# Patient Record
Sex: Male | Born: 1940 | Race: White | Hispanic: No | Marital: Married | Smoking: Former smoker
Health system: Southern US, Community
[De-identification: ages and names within clinical notes are randomized; demographics above are authoritative.]

## PROBLEM LIST (undated history)

## (undated) DIAGNOSIS — J449 Chronic obstructive pulmonary disease, unspecified: Secondary | ICD-10-CM

## (undated) DIAGNOSIS — G4733 Obstructive sleep apnea (adult) (pediatric): Secondary | ICD-10-CM

## (undated) DIAGNOSIS — G4710 Hypersomnia, unspecified: Secondary | ICD-10-CM

## (undated) DIAGNOSIS — J45909 Unspecified asthma, uncomplicated: Secondary | ICD-10-CM

## (undated) DIAGNOSIS — R911 Solitary pulmonary nodule: Secondary | ICD-10-CM

## (undated) DIAGNOSIS — R0683 Snoring: Secondary | ICD-10-CM

## (undated) DIAGNOSIS — E7800 Pure hypercholesterolemia, unspecified: Secondary | ICD-10-CM

## (undated) DIAGNOSIS — I10 Essential (primary) hypertension: Secondary | ICD-10-CM

## (undated) DIAGNOSIS — J301 Allergic rhinitis due to pollen: Secondary | ICD-10-CM

## (undated) DIAGNOSIS — R0602 Shortness of breath: Secondary | ICD-10-CM

## (undated) DIAGNOSIS — I517 Cardiomegaly: Secondary | ICD-10-CM

## (undated) DIAGNOSIS — N400 Enlarged prostate without lower urinary tract symptoms: Secondary | ICD-10-CM

## (undated) HISTORY — DX: Pure hypercholesterolemia, unspecified: E78.00

## (undated) HISTORY — DX: Hypersomnia, unspecified: G47.10

## (undated) HISTORY — PX: HERNIA REPAIR: SHX51

## (undated) HISTORY — DX: Allergic rhinitis due to pollen: J30.1

## (undated) HISTORY — DX: Benign prostatic hyperplasia without lower urinary tract symptoms: N40.0

## (undated) HISTORY — DX: Shortness of breath: R06.02

## (undated) HISTORY — PX: CHOLECYSTECTOMY: SHX55

## (undated) HISTORY — DX: Snoring: R06.83

## (undated) HISTORY — DX: Obstructive sleep apnea (adult) (pediatric): G47.33

## (undated) HISTORY — DX: Chronic obstructive pulmonary disease, unspecified: J44.9

## (undated) HISTORY — DX: Unspecified asthma, uncomplicated: J45.909

## (undated) HISTORY — PX: ARTERY REPAIR: SHX559

## (undated) HISTORY — DX: Solitary pulmonary nodule: R91.1

## (undated) HISTORY — DX: Essential (primary) hypertension: I10

## (undated) HISTORY — PX: BACK SURGERY: SHX140

## (undated) HISTORY — DX: Cardiomegaly: I51.7

---

## 2001-07-07 ENCOUNTER — Ambulatory Visit (HOSPITAL_COMMUNITY): Admission: RE | Admit: 2001-07-07 | Discharge: 2001-07-07 | Payer: Self-pay | Admitting: Neurology

## 2001-07-07 ENCOUNTER — Encounter: Payer: Self-pay | Admitting: Neurology

## 2004-03-21 ENCOUNTER — Ambulatory Visit: Payer: Self-pay | Admitting: Surgery

## 2006-10-28 ENCOUNTER — Inpatient Hospital Stay: Payer: Self-pay | Admitting: Vascular Surgery

## 2006-10-28 ENCOUNTER — Other Ambulatory Visit: Payer: Self-pay

## 2007-03-16 ENCOUNTER — Ambulatory Visit: Payer: Self-pay | Admitting: Family Medicine

## 2007-03-23 ENCOUNTER — Ambulatory Visit: Payer: Self-pay | Admitting: Family Medicine

## 2007-05-11 ENCOUNTER — Ambulatory Visit: Payer: Self-pay

## 2007-07-26 ENCOUNTER — Ambulatory Visit: Payer: Self-pay | Admitting: Family Medicine

## 2008-07-12 IMAGING — CR DG CHEST 1V PORT
1 series · 1 of 1 positions shown · non-contrast
Comparison: none

REASON FOR EXAM: ? cva
COMMENTS:

PROCEDURE:     DXR - DXR PORTABLE CHEST SINGLE VIEW  - October 28, 2006 [DATE]
RESULT:     Comparison: No available comparison exam.

[view not recorded]
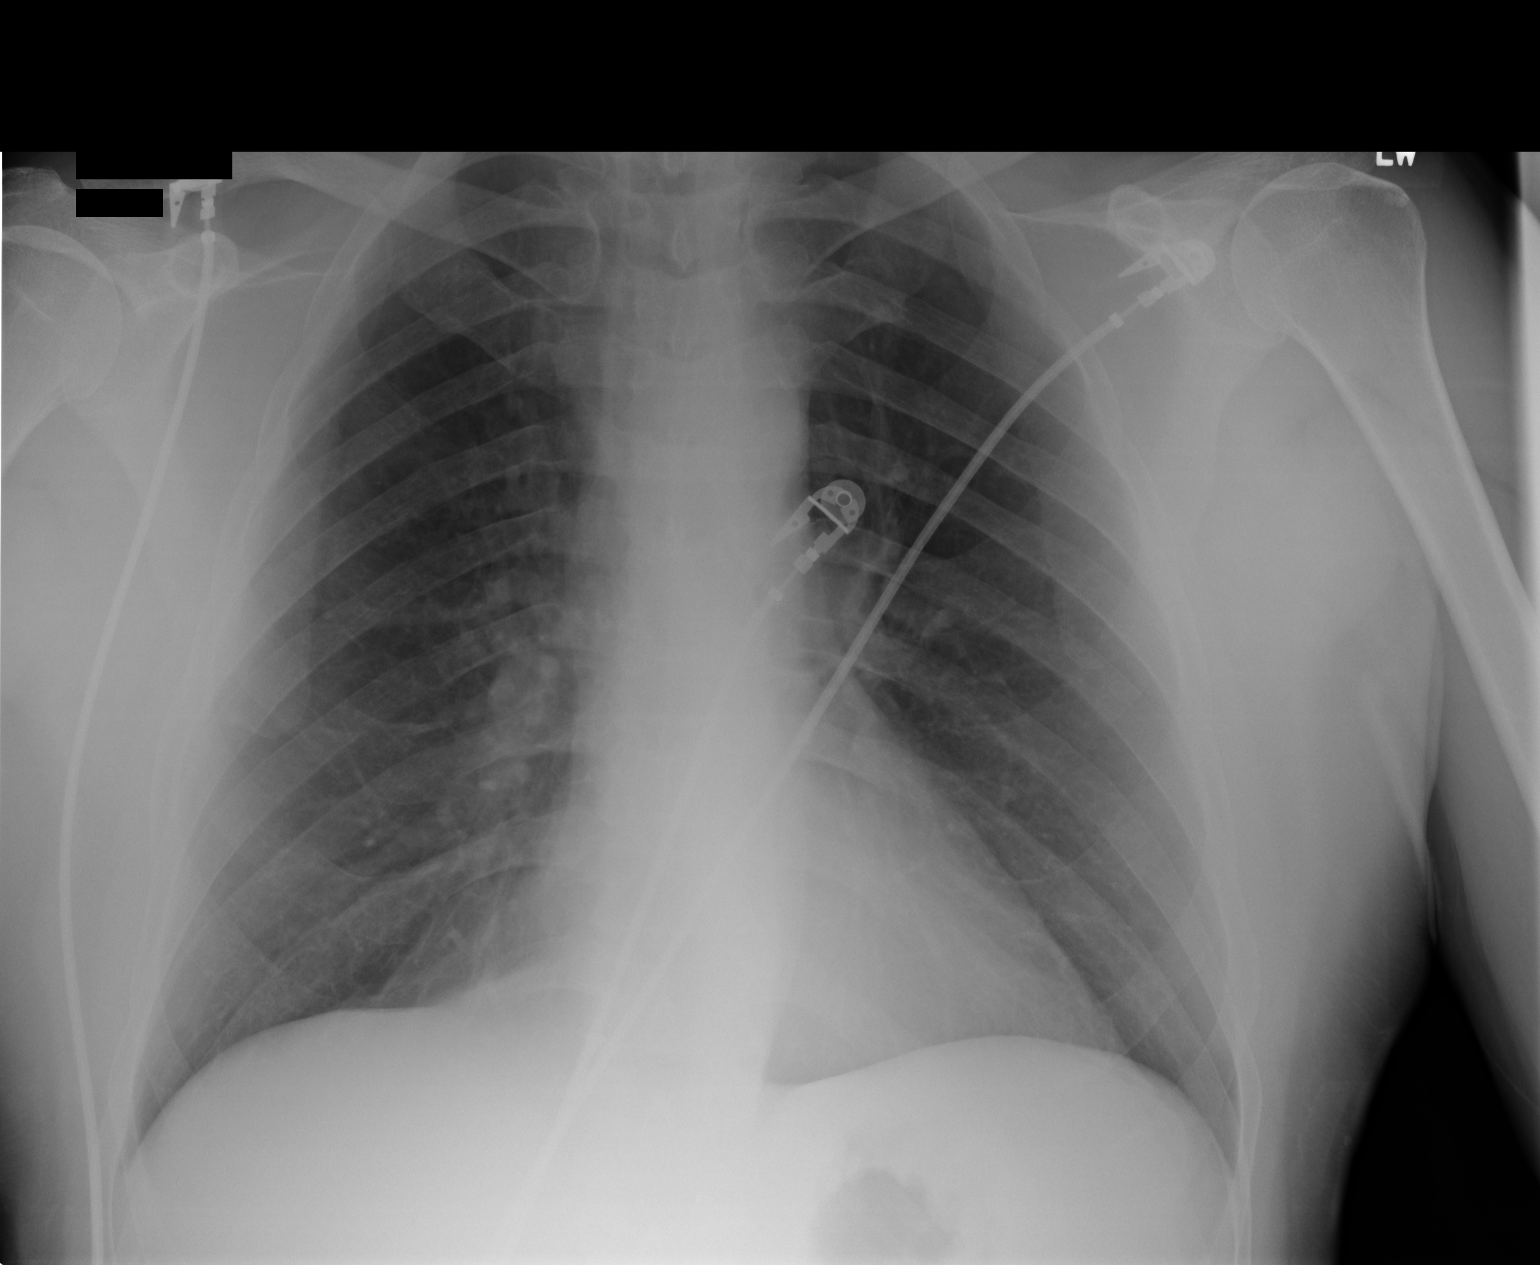

[1 of 1 positions shown; findings below may reference images not displayed]

FINDINGS: There is no significant pulmonary consolidation, pulmonary edema, pleural
effusion, nor pneumothorax. The cardiomediastinal silhouette is within
normal limits.

No grossly displaced rib fracture is noted.
IMPRESSION: 1. No acute cardiopulmonary abnormality is noted.

## 2008-12-25 ENCOUNTER — Ambulatory Visit: Payer: Self-pay | Admitting: Surgery

## 2009-03-22 ENCOUNTER — Observation Stay: Payer: Self-pay | Admitting: Internal Medicine

## 2011-03-11 HISTORY — PX: COLONOSCOPY: SHX174

## 2011-03-13 DIAGNOSIS — E785 Hyperlipidemia, unspecified: Secondary | ICD-10-CM | POA: Diagnosis not present

## 2011-03-13 DIAGNOSIS — J019 Acute sinusitis, unspecified: Secondary | ICD-10-CM | POA: Diagnosis not present

## 2011-03-13 DIAGNOSIS — J45909 Unspecified asthma, uncomplicated: Secondary | ICD-10-CM | POA: Diagnosis not present

## 2011-03-13 DIAGNOSIS — I10 Essential (primary) hypertension: Secondary | ICD-10-CM | POA: Diagnosis not present

## 2011-06-11 DIAGNOSIS — R109 Unspecified abdominal pain: Secondary | ICD-10-CM | POA: Diagnosis not present

## 2011-06-17 DIAGNOSIS — I10 Essential (primary) hypertension: Secondary | ICD-10-CM | POA: Diagnosis not present

## 2011-06-17 DIAGNOSIS — I6529 Occlusion and stenosis of unspecified carotid artery: Secondary | ICD-10-CM | POA: Diagnosis not present

## 2011-07-02 DIAGNOSIS — E785 Hyperlipidemia, unspecified: Secondary | ICD-10-CM | POA: Diagnosis not present

## 2011-07-02 DIAGNOSIS — Z0000 Encounter for general adult medical examination without abnormal findings: Secondary | ICD-10-CM | POA: Diagnosis not present

## 2011-07-02 DIAGNOSIS — R109 Unspecified abdominal pain: Secondary | ICD-10-CM | POA: Diagnosis not present

## 2011-07-02 DIAGNOSIS — N400 Enlarged prostate without lower urinary tract symptoms: Secondary | ICD-10-CM | POA: Diagnosis not present

## 2011-07-02 DIAGNOSIS — J45909 Unspecified asthma, uncomplicated: Secondary | ICD-10-CM | POA: Diagnosis not present

## 2011-07-21 DIAGNOSIS — J40 Bronchitis, not specified as acute or chronic: Secondary | ICD-10-CM | POA: Diagnosis not present

## 2011-07-21 DIAGNOSIS — IMO0002 Reserved for concepts with insufficient information to code with codable children: Secondary | ICD-10-CM | POA: Diagnosis not present

## 2011-07-21 DIAGNOSIS — J45909 Unspecified asthma, uncomplicated: Secondary | ICD-10-CM | POA: Diagnosis not present

## 2011-07-23 DIAGNOSIS — L819 Disorder of pigmentation, unspecified: Secondary | ICD-10-CM | POA: Diagnosis not present

## 2011-07-23 DIAGNOSIS — L821 Other seborrheic keratosis: Secondary | ICD-10-CM | POA: Diagnosis not present

## 2011-07-23 DIAGNOSIS — L570 Actinic keratosis: Secondary | ICD-10-CM | POA: Diagnosis not present

## 2011-07-23 DIAGNOSIS — D1801 Hemangioma of skin and subcutaneous tissue: Secondary | ICD-10-CM | POA: Diagnosis not present

## 2011-07-30 DIAGNOSIS — N400 Enlarged prostate without lower urinary tract symptoms: Secondary | ICD-10-CM | POA: Diagnosis not present

## 2011-07-31 DIAGNOSIS — H2510 Age-related nuclear cataract, unspecified eye: Secondary | ICD-10-CM | POA: Diagnosis not present

## 2011-08-21 DIAGNOSIS — J449 Chronic obstructive pulmonary disease, unspecified: Secondary | ICD-10-CM | POA: Diagnosis not present

## 2011-08-25 DIAGNOSIS — J449 Chronic obstructive pulmonary disease, unspecified: Secondary | ICD-10-CM | POA: Diagnosis not present

## 2011-08-28 DIAGNOSIS — R195 Other fecal abnormalities: Secondary | ICD-10-CM | POA: Diagnosis not present

## 2011-09-04 DIAGNOSIS — R05 Cough: Secondary | ICD-10-CM | POA: Diagnosis not present

## 2011-09-04 DIAGNOSIS — J449 Chronic obstructive pulmonary disease, unspecified: Secondary | ICD-10-CM | POA: Diagnosis not present

## 2011-09-04 DIAGNOSIS — J310 Chronic rhinitis: Secondary | ICD-10-CM | POA: Diagnosis not present

## 2011-09-04 DIAGNOSIS — R0602 Shortness of breath: Secondary | ICD-10-CM | POA: Diagnosis not present

## 2011-09-18 ENCOUNTER — Ambulatory Visit: Payer: Self-pay | Admitting: Unknown Physician Specialty

## 2011-09-18 DIAGNOSIS — Z801 Family history of malignant neoplasm of trachea, bronchus and lung: Secondary | ICD-10-CM | POA: Diagnosis not present

## 2011-09-18 DIAGNOSIS — J449 Chronic obstructive pulmonary disease, unspecified: Secondary | ICD-10-CM | POA: Diagnosis not present

## 2011-09-18 DIAGNOSIS — R195 Other fecal abnormalities: Secondary | ICD-10-CM | POA: Diagnosis not present

## 2011-09-18 DIAGNOSIS — Z7982 Long term (current) use of aspirin: Secondary | ICD-10-CM | POA: Diagnosis not present

## 2011-09-18 DIAGNOSIS — Z79899 Other long term (current) drug therapy: Secondary | ICD-10-CM | POA: Diagnosis not present

## 2011-09-18 DIAGNOSIS — I10 Essential (primary) hypertension: Secondary | ICD-10-CM | POA: Diagnosis not present

## 2011-09-18 DIAGNOSIS — K2970 Gastritis, unspecified, without bleeding: Secondary | ICD-10-CM | POA: Diagnosis not present

## 2011-09-18 DIAGNOSIS — K648 Other hemorrhoids: Secondary | ICD-10-CM | POA: Diagnosis not present

## 2011-09-18 DIAGNOSIS — K2990 Gastroduodenitis, unspecified, without bleeding: Secondary | ICD-10-CM | POA: Diagnosis not present

## 2011-09-18 DIAGNOSIS — Z6825 Body mass index (BMI) 25.0-25.9, adult: Secondary | ICD-10-CM | POA: Diagnosis not present

## 2011-09-18 DIAGNOSIS — K5730 Diverticulosis of large intestine without perforation or abscess without bleeding: Secondary | ICD-10-CM | POA: Diagnosis not present

## 2011-09-18 DIAGNOSIS — Z87891 Personal history of nicotine dependence: Secondary | ICD-10-CM | POA: Diagnosis not present

## 2011-09-18 DIAGNOSIS — Z8673 Personal history of transient ischemic attack (TIA), and cerebral infarction without residual deficits: Secondary | ICD-10-CM | POA: Diagnosis not present

## 2011-09-18 DIAGNOSIS — K644 Residual hemorrhoidal skin tags: Secondary | ICD-10-CM | POA: Diagnosis not present

## 2011-09-18 DIAGNOSIS — K2960 Other gastritis without bleeding: Secondary | ICD-10-CM | POA: Diagnosis not present

## 2011-09-18 LAB — HM COLONOSCOPY

## 2011-09-22 LAB — PATHOLOGY REPORT

## 2011-10-05 DIAGNOSIS — S70259A Superficial foreign body, unspecified hip, initial encounter: Secondary | ICD-10-CM | POA: Diagnosis not present

## 2011-10-05 DIAGNOSIS — M795 Residual foreign body in soft tissue: Secondary | ICD-10-CM | POA: Diagnosis not present

## 2011-10-05 DIAGNOSIS — S80859A Superficial foreign body, unspecified lower leg, initial encounter: Secondary | ICD-10-CM | POA: Diagnosis not present

## 2011-10-05 DIAGNOSIS — Z1812 Retained nonmagnetic metal fragments: Secondary | ICD-10-CM | POA: Diagnosis not present

## 2011-10-24 DIAGNOSIS — J45909 Unspecified asthma, uncomplicated: Secondary | ICD-10-CM | POA: Diagnosis not present

## 2011-10-24 DIAGNOSIS — J301 Allergic rhinitis due to pollen: Secondary | ICD-10-CM | POA: Diagnosis not present

## 2011-12-04 DIAGNOSIS — J301 Allergic rhinitis due to pollen: Secondary | ICD-10-CM | POA: Diagnosis not present

## 2011-12-05 DIAGNOSIS — J301 Allergic rhinitis due to pollen: Secondary | ICD-10-CM | POA: Diagnosis not present

## 2011-12-08 DIAGNOSIS — J301 Allergic rhinitis due to pollen: Secondary | ICD-10-CM | POA: Diagnosis not present

## 2011-12-10 DIAGNOSIS — Z23 Encounter for immunization: Secondary | ICD-10-CM | POA: Diagnosis not present

## 2011-12-11 DIAGNOSIS — J301 Allergic rhinitis due to pollen: Secondary | ICD-10-CM | POA: Diagnosis not present

## 2011-12-15 DIAGNOSIS — J301 Allergic rhinitis due to pollen: Secondary | ICD-10-CM | POA: Diagnosis not present

## 2011-12-18 DIAGNOSIS — J301 Allergic rhinitis due to pollen: Secondary | ICD-10-CM | POA: Diagnosis not present

## 2011-12-22 DIAGNOSIS — J301 Allergic rhinitis due to pollen: Secondary | ICD-10-CM | POA: Diagnosis not present

## 2011-12-25 DIAGNOSIS — J301 Allergic rhinitis due to pollen: Secondary | ICD-10-CM | POA: Diagnosis not present

## 2011-12-26 DIAGNOSIS — J301 Allergic rhinitis due to pollen: Secondary | ICD-10-CM | POA: Diagnosis not present

## 2012-01-01 DIAGNOSIS — J301 Allergic rhinitis due to pollen: Secondary | ICD-10-CM | POA: Diagnosis not present

## 2012-01-08 DIAGNOSIS — J301 Allergic rhinitis due to pollen: Secondary | ICD-10-CM | POA: Diagnosis not present

## 2012-01-12 DIAGNOSIS — J301 Allergic rhinitis due to pollen: Secondary | ICD-10-CM | POA: Diagnosis not present

## 2012-01-13 DIAGNOSIS — J40 Bronchitis, not specified as acute or chronic: Secondary | ICD-10-CM | POA: Diagnosis not present

## 2012-01-13 DIAGNOSIS — J019 Acute sinusitis, unspecified: Secondary | ICD-10-CM | POA: Diagnosis not present

## 2012-01-15 DIAGNOSIS — J301 Allergic rhinitis due to pollen: Secondary | ICD-10-CM | POA: Diagnosis not present

## 2012-01-19 DIAGNOSIS — J301 Allergic rhinitis due to pollen: Secondary | ICD-10-CM | POA: Diagnosis not present

## 2012-01-22 DIAGNOSIS — J449 Chronic obstructive pulmonary disease, unspecified: Secondary | ICD-10-CM | POA: Diagnosis not present

## 2012-01-22 DIAGNOSIS — J301 Allergic rhinitis due to pollen: Secondary | ICD-10-CM | POA: Diagnosis not present

## 2012-01-26 DIAGNOSIS — J301 Allergic rhinitis due to pollen: Secondary | ICD-10-CM | POA: Diagnosis not present

## 2012-01-30 DIAGNOSIS — J301 Allergic rhinitis due to pollen: Secondary | ICD-10-CM | POA: Diagnosis not present

## 2012-02-02 DIAGNOSIS — J301 Allergic rhinitis due to pollen: Secondary | ICD-10-CM | POA: Diagnosis not present

## 2012-02-03 DIAGNOSIS — J301 Allergic rhinitis due to pollen: Secondary | ICD-10-CM | POA: Diagnosis not present

## 2012-02-09 DIAGNOSIS — J301 Allergic rhinitis due to pollen: Secondary | ICD-10-CM | POA: Diagnosis not present

## 2012-02-12 DIAGNOSIS — J301 Allergic rhinitis due to pollen: Secondary | ICD-10-CM | POA: Diagnosis not present

## 2012-02-16 DIAGNOSIS — J301 Allergic rhinitis due to pollen: Secondary | ICD-10-CM | POA: Diagnosis not present

## 2012-02-19 DIAGNOSIS — J301 Allergic rhinitis due to pollen: Secondary | ICD-10-CM | POA: Diagnosis not present

## 2012-02-20 DIAGNOSIS — J301 Allergic rhinitis due to pollen: Secondary | ICD-10-CM | POA: Diagnosis not present

## 2012-02-23 DIAGNOSIS — J301 Allergic rhinitis due to pollen: Secondary | ICD-10-CM | POA: Diagnosis not present

## 2012-02-26 DIAGNOSIS — J301 Allergic rhinitis due to pollen: Secondary | ICD-10-CM | POA: Diagnosis not present

## 2012-03-02 DIAGNOSIS — J301 Allergic rhinitis due to pollen: Secondary | ICD-10-CM | POA: Diagnosis not present

## 2012-03-11 DIAGNOSIS — J301 Allergic rhinitis due to pollen: Secondary | ICD-10-CM | POA: Diagnosis not present

## 2012-03-15 DIAGNOSIS — J301 Allergic rhinitis due to pollen: Secondary | ICD-10-CM | POA: Diagnosis not present

## 2012-03-18 DIAGNOSIS — J301 Allergic rhinitis due to pollen: Secondary | ICD-10-CM | POA: Diagnosis not present

## 2012-03-22 DIAGNOSIS — J301 Allergic rhinitis due to pollen: Secondary | ICD-10-CM | POA: Diagnosis not present

## 2012-03-25 DIAGNOSIS — J301 Allergic rhinitis due to pollen: Secondary | ICD-10-CM | POA: Diagnosis not present

## 2012-03-26 DIAGNOSIS — J301 Allergic rhinitis due to pollen: Secondary | ICD-10-CM | POA: Diagnosis not present

## 2012-03-30 DIAGNOSIS — J301 Allergic rhinitis due to pollen: Secondary | ICD-10-CM | POA: Diagnosis not present

## 2012-04-01 DIAGNOSIS — J301 Allergic rhinitis due to pollen: Secondary | ICD-10-CM | POA: Diagnosis not present

## 2012-04-12 DIAGNOSIS — J301 Allergic rhinitis due to pollen: Secondary | ICD-10-CM | POA: Diagnosis not present

## 2012-04-19 DIAGNOSIS — J301 Allergic rhinitis due to pollen: Secondary | ICD-10-CM | POA: Diagnosis not present

## 2012-04-26 DIAGNOSIS — J301 Allergic rhinitis due to pollen: Secondary | ICD-10-CM | POA: Diagnosis not present

## 2012-04-28 DIAGNOSIS — J301 Allergic rhinitis due to pollen: Secondary | ICD-10-CM | POA: Diagnosis not present

## 2012-05-03 DIAGNOSIS — J301 Allergic rhinitis due to pollen: Secondary | ICD-10-CM | POA: Diagnosis not present

## 2012-05-04 DIAGNOSIS — H43399 Other vitreous opacities, unspecified eye: Secondary | ICD-10-CM | POA: Diagnosis not present

## 2012-05-10 DIAGNOSIS — J301 Allergic rhinitis due to pollen: Secondary | ICD-10-CM | POA: Diagnosis not present

## 2012-05-17 DIAGNOSIS — J301 Allergic rhinitis due to pollen: Secondary | ICD-10-CM | POA: Diagnosis not present

## 2012-05-24 DIAGNOSIS — J301 Allergic rhinitis due to pollen: Secondary | ICD-10-CM | POA: Diagnosis not present

## 2012-05-25 DIAGNOSIS — H43399 Other vitreous opacities, unspecified eye: Secondary | ICD-10-CM | POA: Diagnosis not present

## 2012-05-26 DIAGNOSIS — R5383 Other fatigue: Secondary | ICD-10-CM | POA: Diagnosis not present

## 2012-05-26 DIAGNOSIS — R079 Chest pain, unspecified: Secondary | ICD-10-CM | POA: Diagnosis not present

## 2012-05-27 DIAGNOSIS — R079 Chest pain, unspecified: Secondary | ICD-10-CM | POA: Diagnosis not present

## 2012-05-27 DIAGNOSIS — R5383 Other fatigue: Secondary | ICD-10-CM | POA: Diagnosis not present

## 2012-05-27 DIAGNOSIS — I10 Essential (primary) hypertension: Secondary | ICD-10-CM | POA: Diagnosis not present

## 2012-05-27 DIAGNOSIS — R5381 Other malaise: Secondary | ICD-10-CM | POA: Diagnosis not present

## 2012-05-31 DIAGNOSIS — J301 Allergic rhinitis due to pollen: Secondary | ICD-10-CM | POA: Diagnosis not present

## 2012-06-02 DIAGNOSIS — R5383 Other fatigue: Secondary | ICD-10-CM | POA: Diagnosis not present

## 2012-06-02 DIAGNOSIS — I119 Hypertensive heart disease without heart failure: Secondary | ICD-10-CM | POA: Diagnosis not present

## 2012-06-02 DIAGNOSIS — R0789 Other chest pain: Secondary | ICD-10-CM | POA: Diagnosis not present

## 2012-06-02 DIAGNOSIS — R5381 Other malaise: Secondary | ICD-10-CM | POA: Diagnosis not present

## 2012-06-02 DIAGNOSIS — R079 Chest pain, unspecified: Secondary | ICD-10-CM | POA: Diagnosis not present

## 2012-06-02 DIAGNOSIS — E782 Mixed hyperlipidemia: Secondary | ICD-10-CM | POA: Diagnosis not present

## 2012-06-07 DIAGNOSIS — J301 Allergic rhinitis due to pollen: Secondary | ICD-10-CM | POA: Diagnosis not present

## 2012-06-15 DIAGNOSIS — J301 Allergic rhinitis due to pollen: Secondary | ICD-10-CM | POA: Diagnosis not present

## 2012-06-22 DIAGNOSIS — I6529 Occlusion and stenosis of unspecified carotid artery: Secondary | ICD-10-CM | POA: Diagnosis not present

## 2012-06-22 DIAGNOSIS — E785 Hyperlipidemia, unspecified: Secondary | ICD-10-CM | POA: Diagnosis not present

## 2012-06-22 DIAGNOSIS — J301 Allergic rhinitis due to pollen: Secondary | ICD-10-CM | POA: Diagnosis not present

## 2012-06-28 DIAGNOSIS — J301 Allergic rhinitis due to pollen: Secondary | ICD-10-CM | POA: Diagnosis not present

## 2012-07-02 DIAGNOSIS — I10 Essential (primary) hypertension: Secondary | ICD-10-CM | POA: Diagnosis not present

## 2012-07-02 DIAGNOSIS — N400 Enlarged prostate without lower urinary tract symptoms: Secondary | ICD-10-CM | POA: Diagnosis not present

## 2012-07-02 DIAGNOSIS — Z0000 Encounter for general adult medical examination without abnormal findings: Secondary | ICD-10-CM | POA: Diagnosis not present

## 2012-07-02 DIAGNOSIS — E785 Hyperlipidemia, unspecified: Secondary | ICD-10-CM | POA: Diagnosis not present

## 2012-07-02 DIAGNOSIS — Z79899 Other long term (current) drug therapy: Secondary | ICD-10-CM | POA: Diagnosis not present

## 2012-07-06 DIAGNOSIS — J301 Allergic rhinitis due to pollen: Secondary | ICD-10-CM | POA: Diagnosis not present

## 2012-07-12 DIAGNOSIS — J301 Allergic rhinitis due to pollen: Secondary | ICD-10-CM | POA: Diagnosis not present

## 2012-07-19 DIAGNOSIS — J301 Allergic rhinitis due to pollen: Secondary | ICD-10-CM | POA: Diagnosis not present

## 2012-07-21 DIAGNOSIS — J310 Chronic rhinitis: Secondary | ICD-10-CM | POA: Diagnosis not present

## 2012-07-21 DIAGNOSIS — J45909 Unspecified asthma, uncomplicated: Secondary | ICD-10-CM | POA: Diagnosis not present

## 2012-07-26 DIAGNOSIS — J301 Allergic rhinitis due to pollen: Secondary | ICD-10-CM | POA: Diagnosis not present

## 2012-07-27 DIAGNOSIS — J40 Bronchitis, not specified as acute or chronic: Secondary | ICD-10-CM | POA: Diagnosis not present

## 2012-07-27 DIAGNOSIS — J45909 Unspecified asthma, uncomplicated: Secondary | ICD-10-CM | POA: Diagnosis not present

## 2012-07-27 DIAGNOSIS — N401 Enlarged prostate with lower urinary tract symptoms: Secondary | ICD-10-CM | POA: Diagnosis not present

## 2012-08-03 DIAGNOSIS — H2510 Age-related nuclear cataract, unspecified eye: Secondary | ICD-10-CM | POA: Diagnosis not present

## 2012-08-03 DIAGNOSIS — J301 Allergic rhinitis due to pollen: Secondary | ICD-10-CM | POA: Diagnosis not present

## 2012-08-09 DIAGNOSIS — J301 Allergic rhinitis due to pollen: Secondary | ICD-10-CM | POA: Diagnosis not present

## 2012-08-16 DIAGNOSIS — L723 Sebaceous cyst: Secondary | ICD-10-CM | POA: Diagnosis not present

## 2012-08-16 DIAGNOSIS — L570 Actinic keratosis: Secondary | ICD-10-CM | POA: Diagnosis not present

## 2012-08-16 DIAGNOSIS — J301 Allergic rhinitis due to pollen: Secondary | ICD-10-CM | POA: Diagnosis not present

## 2012-08-16 DIAGNOSIS — Z1283 Encounter for screening for malignant neoplasm of skin: Secondary | ICD-10-CM | POA: Diagnosis not present

## 2012-08-23 DIAGNOSIS — J301 Allergic rhinitis due to pollen: Secondary | ICD-10-CM | POA: Diagnosis not present

## 2012-08-30 DIAGNOSIS — J301 Allergic rhinitis due to pollen: Secondary | ICD-10-CM | POA: Diagnosis not present

## 2012-09-13 DIAGNOSIS — J301 Allergic rhinitis due to pollen: Secondary | ICD-10-CM | POA: Diagnosis not present

## 2012-09-20 DIAGNOSIS — J301 Allergic rhinitis due to pollen: Secondary | ICD-10-CM | POA: Diagnosis not present

## 2012-09-27 DIAGNOSIS — J301 Allergic rhinitis due to pollen: Secondary | ICD-10-CM | POA: Diagnosis not present

## 2012-10-11 DIAGNOSIS — J301 Allergic rhinitis due to pollen: Secondary | ICD-10-CM | POA: Diagnosis not present

## 2012-10-18 DIAGNOSIS — J301 Allergic rhinitis due to pollen: Secondary | ICD-10-CM | POA: Diagnosis not present

## 2012-10-25 DIAGNOSIS — J301 Allergic rhinitis due to pollen: Secondary | ICD-10-CM | POA: Diagnosis not present

## 2012-11-01 DIAGNOSIS — J301 Allergic rhinitis due to pollen: Secondary | ICD-10-CM | POA: Diagnosis not present

## 2012-11-11 DIAGNOSIS — J301 Allergic rhinitis due to pollen: Secondary | ICD-10-CM | POA: Diagnosis not present

## 2012-11-16 DIAGNOSIS — J301 Allergic rhinitis due to pollen: Secondary | ICD-10-CM | POA: Diagnosis not present

## 2012-11-22 DIAGNOSIS — J301 Allergic rhinitis due to pollen: Secondary | ICD-10-CM | POA: Diagnosis not present

## 2012-12-02 DIAGNOSIS — J301 Allergic rhinitis due to pollen: Secondary | ICD-10-CM | POA: Diagnosis not present

## 2012-12-09 DIAGNOSIS — J301 Allergic rhinitis due to pollen: Secondary | ICD-10-CM | POA: Diagnosis not present

## 2012-12-13 DIAGNOSIS — J301 Allergic rhinitis due to pollen: Secondary | ICD-10-CM | POA: Diagnosis not present

## 2012-12-16 DIAGNOSIS — J301 Allergic rhinitis due to pollen: Secondary | ICD-10-CM | POA: Diagnosis not present

## 2012-12-22 DIAGNOSIS — E785 Hyperlipidemia, unspecified: Secondary | ICD-10-CM | POA: Diagnosis not present

## 2012-12-22 DIAGNOSIS — I10 Essential (primary) hypertension: Secondary | ICD-10-CM | POA: Diagnosis not present

## 2012-12-22 DIAGNOSIS — I6529 Occlusion and stenosis of unspecified carotid artery: Secondary | ICD-10-CM | POA: Diagnosis not present

## 2012-12-24 DIAGNOSIS — J301 Allergic rhinitis due to pollen: Secondary | ICD-10-CM | POA: Diagnosis not present

## 2013-01-03 DIAGNOSIS — J301 Allergic rhinitis due to pollen: Secondary | ICD-10-CM | POA: Diagnosis not present

## 2013-01-06 DIAGNOSIS — I10 Essential (primary) hypertension: Secondary | ICD-10-CM | POA: Diagnosis not present

## 2013-01-06 DIAGNOSIS — Z23 Encounter for immunization: Secondary | ICD-10-CM | POA: Diagnosis not present

## 2013-01-06 DIAGNOSIS — E785 Hyperlipidemia, unspecified: Secondary | ICD-10-CM | POA: Diagnosis not present

## 2013-01-13 DIAGNOSIS — J301 Allergic rhinitis due to pollen: Secondary | ICD-10-CM | POA: Diagnosis not present

## 2013-01-18 DIAGNOSIS — J301 Allergic rhinitis due to pollen: Secondary | ICD-10-CM | POA: Diagnosis not present

## 2013-01-26 DIAGNOSIS — L570 Actinic keratosis: Secondary | ICD-10-CM | POA: Diagnosis not present

## 2013-01-27 DIAGNOSIS — J301 Allergic rhinitis due to pollen: Secondary | ICD-10-CM | POA: Diagnosis not present

## 2013-01-27 DIAGNOSIS — N400 Enlarged prostate without lower urinary tract symptoms: Secondary | ICD-10-CM | POA: Diagnosis not present

## 2013-02-01 DIAGNOSIS — J301 Allergic rhinitis due to pollen: Secondary | ICD-10-CM | POA: Diagnosis not present

## 2013-02-08 DIAGNOSIS — J301 Allergic rhinitis due to pollen: Secondary | ICD-10-CM | POA: Diagnosis not present

## 2013-02-15 DIAGNOSIS — H20049 Secondary noninfectious iridocyclitis, unspecified eye: Secondary | ICD-10-CM | POA: Diagnosis not present

## 2013-02-21 DIAGNOSIS — J301 Allergic rhinitis due to pollen: Secondary | ICD-10-CM | POA: Diagnosis not present

## 2013-02-23 DIAGNOSIS — H20049 Secondary noninfectious iridocyclitis, unspecified eye: Secondary | ICD-10-CM | POA: Diagnosis not present

## 2013-02-28 DIAGNOSIS — J301 Allergic rhinitis due to pollen: Secondary | ICD-10-CM | POA: Diagnosis not present

## 2013-03-01 DIAGNOSIS — H20049 Secondary noninfectious iridocyclitis, unspecified eye: Secondary | ICD-10-CM | POA: Diagnosis not present

## 2013-04-11 DIAGNOSIS — J301 Allergic rhinitis due to pollen: Secondary | ICD-10-CM | POA: Diagnosis not present

## 2013-04-11 DIAGNOSIS — H15009 Unspecified scleritis, unspecified eye: Secondary | ICD-10-CM | POA: Diagnosis not present

## 2013-05-02 DIAGNOSIS — H15009 Unspecified scleritis, unspecified eye: Secondary | ICD-10-CM | POA: Diagnosis not present

## 2013-05-02 DIAGNOSIS — J301 Allergic rhinitis due to pollen: Secondary | ICD-10-CM | POA: Diagnosis not present

## 2013-05-10 DIAGNOSIS — J301 Allergic rhinitis due to pollen: Secondary | ICD-10-CM | POA: Diagnosis not present

## 2013-05-19 DIAGNOSIS — J301 Allergic rhinitis due to pollen: Secondary | ICD-10-CM | POA: Diagnosis not present

## 2013-05-24 DIAGNOSIS — J301 Allergic rhinitis due to pollen: Secondary | ICD-10-CM | POA: Diagnosis not present

## 2013-05-30 DIAGNOSIS — J301 Allergic rhinitis due to pollen: Secondary | ICD-10-CM | POA: Diagnosis not present

## 2013-06-06 DIAGNOSIS — J301 Allergic rhinitis due to pollen: Secondary | ICD-10-CM | POA: Diagnosis not present

## 2013-06-16 DIAGNOSIS — J301 Allergic rhinitis due to pollen: Secondary | ICD-10-CM | POA: Diagnosis not present

## 2013-06-21 DIAGNOSIS — J301 Allergic rhinitis due to pollen: Secondary | ICD-10-CM | POA: Diagnosis not present

## 2013-06-27 DIAGNOSIS — J301 Allergic rhinitis due to pollen: Secondary | ICD-10-CM | POA: Diagnosis not present

## 2013-07-05 DIAGNOSIS — J301 Allergic rhinitis due to pollen: Secondary | ICD-10-CM | POA: Diagnosis not present

## 2013-07-05 DIAGNOSIS — J45909 Unspecified asthma, uncomplicated: Secondary | ICD-10-CM | POA: Diagnosis not present

## 2013-07-05 DIAGNOSIS — Z0000 Encounter for general adult medical examination without abnormal findings: Secondary | ICD-10-CM | POA: Diagnosis not present

## 2013-07-11 DIAGNOSIS — J301 Allergic rhinitis due to pollen: Secondary | ICD-10-CM | POA: Diagnosis not present

## 2013-07-12 DIAGNOSIS — I10 Essential (primary) hypertension: Secondary | ICD-10-CM | POA: Diagnosis not present

## 2013-07-12 DIAGNOSIS — E785 Hyperlipidemia, unspecified: Secondary | ICD-10-CM | POA: Diagnosis not present

## 2013-07-12 DIAGNOSIS — I6529 Occlusion and stenosis of unspecified carotid artery: Secondary | ICD-10-CM | POA: Diagnosis not present

## 2013-07-18 DIAGNOSIS — J301 Allergic rhinitis due to pollen: Secondary | ICD-10-CM | POA: Diagnosis not present

## 2013-07-25 DIAGNOSIS — J301 Allergic rhinitis due to pollen: Secondary | ICD-10-CM | POA: Diagnosis not present

## 2013-07-27 DIAGNOSIS — N400 Enlarged prostate without lower urinary tract symptoms: Secondary | ICD-10-CM | POA: Diagnosis not present

## 2013-08-02 DIAGNOSIS — J301 Allergic rhinitis due to pollen: Secondary | ICD-10-CM | POA: Diagnosis not present

## 2013-08-04 DIAGNOSIS — H2510 Age-related nuclear cataract, unspecified eye: Secondary | ICD-10-CM | POA: Diagnosis not present

## 2013-08-08 ENCOUNTER — Ambulatory Visit (INDEPENDENT_AMBULATORY_CARE_PROVIDER_SITE_OTHER): Payer: Medicare Other | Admitting: Pulmonary Disease

## 2013-08-08 ENCOUNTER — Encounter: Payer: Self-pay | Admitting: Pulmonary Disease

## 2013-08-08 VITALS — BP 134/72 | HR 74 | Ht 70.0 in | Wt 173.0 lb

## 2013-08-08 DIAGNOSIS — J301 Allergic rhinitis due to pollen: Secondary | ICD-10-CM | POA: Diagnosis not present

## 2013-08-08 DIAGNOSIS — J45909 Unspecified asthma, uncomplicated: Secondary | ICD-10-CM | POA: Diagnosis not present

## 2013-08-08 DIAGNOSIS — R0602 Shortness of breath: Secondary | ICD-10-CM

## 2013-08-08 MED ORDER — PREDNISONE 20 MG PO TABS: 20.0000 mg | ORAL_TABLET | Freq: Every day | ORAL | Status: DC

## 2013-08-08 NOTE — Assessment & Plan Note (Signed)
I have 0 objective data to guide me here, but it sounds like by history that we are dealing with chronic and somewhat severe asthma. He may also have COPD because he smoked a pack a day for about 20 years and quit in his early 40s. However, given the seasonal aspect of his disease I feel like we are most likely dealing with asthma. Further, he has a significant allergy history.  Plan: -Obtain results of prior pulmonary function testing -Chest x-ray - Check Serum IgE, serum eosinophils -Short prednisone taper -Add small particle inhale corticosteroid -Continue Advair, educated to use twice a day -Continue Spiriva daily -Use albuterol on an as-needed basis -Followup in 4-6 weeks

## 2013-08-08 NOTE — Patient Instructions (Addendum)
Take the prednisone 20mg  for five days Take Spiriva once daily Take Advair two times per day Take Aerospan two puffs twice a day Take albuterol 2 puffs (or one nebulizer) every four hours as needed for shortness of breath We will see you back in 4-6 weeks or sooner if needed

## 2013-08-08 NOTE — Progress Notes (Signed)
Subjective:    Patient ID: Marcus Delacruz, male    DOB: Sep 08, 1940, 73 y.o.   MRN: [id number]  HPI  This is a very pleasant 73 year old male who comes her clinic today for evaluation of shortness of breath. He has a past medical history significant for possibly asthma and has been treated with immunotherapy multiple times over the years. He smoked a pack a day for about 20 years and quit in his 53s.  Over the years he's been treated with immunotherapy multiple times for what sounds like asthma-like symptoms. He describes chest tightness and shortness of breath primarily with out significant allergic rhinitis symptoms. He has lived in multiple areas of the country and he says that no matter where he has lived he's managed to get significant asthma symptoms which is always responded fairly well to immunotherapy.  He's been living in [place] for several years now and he says that consistently in May and October he has had significant shortness of breath which has required doctor's visits despite the immunotherapy. He says that he will develop chest tightness, cough, and shortness of breath which will come on typically in April through May. The cough is almost always dry and last all day long. This usually necessitates a visit to the doctor's office for albuterol nebulization as well as a prednisone taper. He says that these episodes have been lasting longer and are more intense in the last year or 2. During this time he takes Advair and Spiriva. In previous years he is only used those medicines in the spring and fall to try to prevent these spells. However, in the last several months he has been told by his physicians that he needs to be taking those medicines at that time.  He says that between the episodes such as in December he does not have problems with shortness of breath. However, he notes that he does not stay very active.  Past Medical History  Diagnosis Date  . Hypertension   .  Hypercholesteremia      Family History  Problem Relation Age of Onset  . COPD Mother   . Asthma Son   . Cancer Father     lung     History   Social History  . Marital Status: Married    Spouse Name: N/A    Number of Children: N/A  . Years of Education: N/A   Occupational History  . Not on file.   Social History Main Topics  . Smoking status: Former Smoker -- 1.00 packs/day for 20 years    Types: Cigarettes    Quit date: 08/09/1983  . Smokeless tobacco: Never Used  . Alcohol Use: Yes     Comment: social  . Drug Use: No  . Sexual Activity: Not on file   Other Topics Concern  . Not on file   Social History Narrative  . No narrative on file     Allergies  Allergen Reactions  . Codeine Itching     No outpatient prescriptions prior to visit.   No facility-administered medications prior to visit.      Review of Systems  Constitutional: Negative for fever and unexpected weight change.  HENT: Positive for congestion. Negative for dental problem, ear pain, nosebleeds, postnasal drip, rhinorrhea, sinus pressure, sneezing, sore throat and trouble swallowing.   Eyes: Negative for redness and itching.  Respiratory: Positive for cough and shortness of breath. Negative for chest tightness and wheezing.   Cardiovascular: Negative for palpitations and leg swelling.  Gastrointestinal: Negative for nausea and vomiting.  Genitourinary: Negative for dysuria.  Musculoskeletal: Negative for joint swelling.  Skin: Negative for rash.  Neurological: Negative for headaches.  Hematological: Does not bruise/bleed easily.  Psychiatric/Behavioral: Negative for dysphoric mood. The patient is not nervous/anxious.        Objective:   Physical Exam Filed Vitals:   08/08/13 1544  BP: 134/72  Pulse: 74  Height: 5\' 10"  (1.778 m)  Weight: 173 lb (78.472 kg)  SpO2: 94%   RA  Ambulated 500 feet on room air and his oxygen saturation remained normal  Gen: well appearing, no acute  distress HEENT: NCAT, PERRL, EOMi, OP clear, neck supple without masses PULM: mild wheeze RLL, otherwise diminished air movement but clear CV: RRR, no mgr, no JVD AB: BS+, soft, nontender, no hsm Ext: warm, no edema, no clubbing, no cyanosis Derm: no rash or skin breakdown Neuro: A&Ox4, CN II-XII intact, strength 5/5 in all 4 extremities        Assessment & Plan:   Asthma, chronic I have 0 objective data to guide me here, but it sounds like by history that we are dealing with chronic and somewhat severe asthma. He may also have COPD because he smoked a pack a day for about 20 years and quit in his early 60s. However, given the seasonal aspect of his disease I feel like we are most likely dealing with asthma. Further, he has a significant allergy history.  Plan: -Obtain results of prior pulmonary function testing -Chest x-ray - Check Serum IgE, serum eosinophils -Short prednisone taper -Add small particle inhale corticosteroid -Continue Advair, educated to use twice a day -Continue Spiriva daily -Use albuterol on an as-needed basis -Followup in 4-6 weeks    Updated Medication List Outpatient Encounter Prescriptions as of 08/08/2013  Medication Sig  . albuterol (PROVENTIL HFA;VENTOLIN HFA) 108 (90 BASE) MCG/ACT inhaler Inhale 2 puffs into the lungs every 6 (six) hours as needed for wheezing or shortness of breath.  Marland Kitchen albuterol (PROVENTIL) (2.5 MG/3ML) 0.083% nebulizer solution Take 2.5 mg by nebulization every 6 (six) hours as needed for wheezing or shortness of breath.  Marland Kitchen aspirin 81 MG tablet Take 81 mg by mouth daily.  . Dutasteride-Tamsulosin HCl (JALYN) 0.5-0.4 MG CAPS Take 1 capsule by mouth daily.  . Fluticasone-Salmeterol (ADVAIR) 250-50 MCG/DOSE AEPB Inhale 1 puff into the lungs 2 (two) times daily.  Marland Kitchen lisinopril-hydrochlorothiazide (PRINZIDE,ZESTORETIC) 10-12.5 MG per tablet Take 1 tablet by mouth daily.  . rosuvastatin (CRESTOR) 20 MG tablet Take 20 mg by mouth daily.    Marland Kitchen tiotropium (SPIRIVA) 18 MCG inhalation capsule Place 18 mcg into inhaler and inhale daily.

## 2013-08-09 LAB — CBC WITH DIFFERENTIAL/PLATELET
Basophils Absolute: 0 10*3/uL (ref 0.0–0.1)
Basophils Relative: 0 % (ref 0.0–3.0)
EOS PCT: 9.9 % — AB (ref 0.0–5.0)
Eosinophils Absolute: 0.8 10*3/uL — ABNORMAL HIGH (ref 0.0–0.7)
HEMATOCRIT: 43.3 % (ref 39.0–52.0)
HEMOGLOBIN: 14.7 g/dL (ref 13.0–17.0)
Lymphocytes Relative: 13.6 % (ref 12.0–46.0)
Lymphs Abs: 1.1 10*3/uL (ref 0.7–4.0)
MCHC: 33.9 g/dL (ref 30.0–36.0)
MCV: 86.7 fl (ref 78.0–100.0)
MONOS PCT: 11.2 % (ref 3.0–12.0)
Monocytes Absolute: 0.9 10*3/uL (ref 0.1–1.0)
NEUTROS ABS: 5.4 10*3/uL (ref 1.4–7.7)
Neutrophils Relative %: 65.3 % (ref 43.0–77.0)
PLATELETS: 354 10*3/uL (ref 150.0–400.0)
RBC: 4.99 Mil/uL (ref 4.22–5.81)
RDW: 13.2 % (ref 11.5–15.5)
WBC: 8.2 10*3/uL (ref 4.0–10.5)

## 2013-08-09 LAB — IGE: IGE (IMMUNOGLOBULIN E), SERUM: 192.7 [IU]/mL — AB (ref 0.0–180.0)

## 2013-08-11 ENCOUNTER — Telehealth: Payer: Self-pay | Admitting: Pulmonary Disease

## 2013-08-11 DIAGNOSIS — J45909 Unspecified asthma, uncomplicated: Secondary | ICD-10-CM

## 2013-08-11 NOTE — Progress Notes (Signed)
Quick Note:  Pt is aware. pft ordered. rov scheduled. Nothing further needed. ______

## 2013-08-11 NOTE — Progress Notes (Signed)
Quick Note:  lmtcb X1 with wife to cb ______

## 2013-08-11 NOTE — Telephone Encounter (Signed)
A, Please let him know that his labs showed that he appears to still be having an allergic reaction to something. I want him to have the lung function test we discussed and then see me back in clinic as discussed previously. Thanks B -----------------------------------------------------------  Spoke with pt, he is aware of results and recs.  Already has an appt on 7/13.  Ordered PFT at [facility].  Nothing further needed at this time.

## 2013-08-15 ENCOUNTER — Telehealth: Payer: Self-pay | Admitting: Pulmonary Disease

## 2013-08-15 NOTE — Telephone Encounter (Signed)
Rec'd from [facility] forward 17 pages to Dr. Lake Bells

## 2013-08-18 DIAGNOSIS — J301 Allergic rhinitis due to pollen: Secondary | ICD-10-CM | POA: Diagnosis not present

## 2013-08-24 ENCOUNTER — Encounter: Payer: Self-pay | Admitting: Pulmonary Disease

## 2013-08-29 DIAGNOSIS — J301 Allergic rhinitis due to pollen: Secondary | ICD-10-CM | POA: Diagnosis not present

## 2013-09-01 DIAGNOSIS — J301 Allergic rhinitis due to pollen: Secondary | ICD-10-CM | POA: Diagnosis not present

## 2013-09-14 ENCOUNTER — Telehealth: Payer: Self-pay | Admitting: Pulmonary Disease

## 2013-09-14 MED ORDER — TIOTROPIUM BROMIDE MONOHYDRATE 18 MCG IN CAPS: 18.0000 ug | ORAL_CAPSULE | Freq: Every day | RESPIRATORY_TRACT | Status: DC

## 2013-09-14 NOTE — Telephone Encounter (Signed)
Pt aware RX has been sent. Nothing further needed 

## 2013-09-15 DIAGNOSIS — J301 Allergic rhinitis due to pollen: Secondary | ICD-10-CM | POA: Diagnosis not present

## 2013-09-19 ENCOUNTER — Ambulatory Visit: Payer: No Typology Code available for payment source | Admitting: Pulmonary Disease

## 2013-09-26 ENCOUNTER — Ambulatory Visit: Payer: No Typology Code available for payment source | Admitting: Pulmonary Disease

## 2013-10-20 DIAGNOSIS — J301 Allergic rhinitis due to pollen: Secondary | ICD-10-CM | POA: Diagnosis not present

## 2013-10-27 DIAGNOSIS — J301 Allergic rhinitis due to pollen: Secondary | ICD-10-CM | POA: Diagnosis not present

## 2013-11-01 DIAGNOSIS — J301 Allergic rhinitis due to pollen: Secondary | ICD-10-CM | POA: Diagnosis not present

## 2013-11-07 DIAGNOSIS — J301 Allergic rhinitis due to pollen: Secondary | ICD-10-CM | POA: Diagnosis not present

## 2013-11-10 ENCOUNTER — Ambulatory Visit: Payer: No Typology Code available for payment source | Admitting: Pulmonary Disease

## 2013-11-17 DIAGNOSIS — J301 Allergic rhinitis due to pollen: Secondary | ICD-10-CM | POA: Diagnosis not present

## 2013-11-18 DIAGNOSIS — J45909 Unspecified asthma, uncomplicated: Secondary | ICD-10-CM | POA: Diagnosis not present

## 2013-11-18 DIAGNOSIS — J309 Allergic rhinitis, unspecified: Secondary | ICD-10-CM | POA: Diagnosis not present

## 2013-11-18 DIAGNOSIS — J40 Bronchitis, not specified as acute or chronic: Secondary | ICD-10-CM | POA: Diagnosis not present

## 2013-11-21 DIAGNOSIS — J301 Allergic rhinitis due to pollen: Secondary | ICD-10-CM | POA: Diagnosis not present

## 2013-11-23 ENCOUNTER — Ambulatory Visit (INDEPENDENT_AMBULATORY_CARE_PROVIDER_SITE_OTHER): Payer: Medicare Other | Admitting: Pulmonary Disease

## 2013-11-23 ENCOUNTER — Ambulatory Visit (INDEPENDENT_AMBULATORY_CARE_PROVIDER_SITE_OTHER)
Admission: RE | Admit: 2013-11-23 | Discharge: 2013-11-23 | Disposition: A | Discharge status: Home / Self Care | Payer: Medicare Other | Source: Ambulatory Visit | Attending: Pulmonary Disease | Admitting: Pulmonary Disease

## 2013-11-23 ENCOUNTER — Encounter: Payer: Self-pay | Admitting: Pulmonary Disease

## 2013-11-23 VITALS — BP 128/66 | HR 71 | Ht 70.0 in | Wt 174.0 lb

## 2013-11-23 DIAGNOSIS — Z23 Encounter for immunization: Secondary | ICD-10-CM

## 2013-11-23 DIAGNOSIS — J4540 Moderate persistent asthma, uncomplicated: Secondary | ICD-10-CM

## 2013-11-23 DIAGNOSIS — Z9114 Patient's other noncompliance with medication regimen: Secondary | ICD-10-CM | POA: Insufficient documentation

## 2013-11-23 DIAGNOSIS — Z91199 Patient's noncompliance with other medical treatment and regimen due to unspecified reason: Secondary | ICD-10-CM | POA: Diagnosis not present

## 2013-11-23 DIAGNOSIS — Z9119 Patient's noncompliance with other medical treatment and regimen: Secondary | ICD-10-CM

## 2013-11-23 DIAGNOSIS — J45909 Unspecified asthma, uncomplicated: Secondary | ICD-10-CM | POA: Diagnosis not present

## 2013-11-23 DIAGNOSIS — R0602 Shortness of breath: Secondary | ICD-10-CM | POA: Diagnosis not present

## 2013-11-23 MED ORDER — FLUTICASONE-SALMETEROL 115-21 MCG/ACT IN AERO: 2.0000 | INHALATION_SPRAY | Freq: Two times a day (BID) | RESPIRATORY_TRACT | Status: DC

## 2013-11-23 MED ORDER — AEROCHAMBER MV MISC: Status: DC

## 2013-11-23 NOTE — Progress Notes (Signed)
Subjective:    Patient ID: Marcus Delacruz, male    DOB: June 25, 1940, 73 y.o.   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 Long history of allergies and currently on immunotherapy as of 2015. Smoked one pack of cigarettes daily for 30 years.  HPI  11/23/2013 ROV >  Chief Complaint  Patient presents with  . Follow-up    Pt c/o SOB with exertion, prod cough with little white mucus.  Just finisished zpak yesterday, on day 6 of pred taper.  Denies fever.    For the last two weeks Marcus Delacruz has had a hard time breathing.  He is at the tail end of a prednisone taper and zpack that was written by his PCP.  He said he had cough, mucus , produciton and dyspnea.  He is now feeling better.  Initially he started taking Aerospan and he doesn't think it made any difference. He noted that he had been using his Aerospan and Advair consistently initially after he saw me but then he stopped using it for the rest of the summer. When his exacerbation occurred a few weeks ago he was not taking any inhaled therapies. He has now started taking Advair back. Now that he has been on prednisone for several days he says he is feeling better with no symptoms.  Past Medical History  Diagnosis Date  . Hypertension   . Hypercholesteremia      Review of Systems  Constitutional: Negative for fever, chills and fatigue.  HENT: Negative for postnasal drip, rhinorrhea and sinus pressure.   Respiratory: Negative for cough, shortness of breath and wheezing.   Cardiovascular: Negative for chest pain, palpitations and leg swelling.       Objective:   Physical Exam Filed Vitals:   11/23/13 0901  BP: 128/66  Pulse: 71  Height: 5\' 10"  (1.778 m)  Weight: 174 lb (78.926 kg)  SpO2: 98%    RA  Gen: well appearing, no acute distress HEENT: NCAT, EOMi, OP clear,  PULM: CTA B CV: RRR, no mgr, no JVD AB: BS+, soft, nontender Ext: warm, no edema, no clubbing, no cyanosis Derm: no  rash or skin breakdown Neuro: A&Ox4, MAEW       Assessment & Plan:   Asthma, chronic He has severe airflow obstruction which is consistent with asthma. 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  We will get a chest x-ray to see if there is evidence of emphysema.  I can't really say if he has such severe asthma that causes exacerbations with Advair because he takes the Advair sporadically. Consistent inhaler use is a big problem for him.  Plan: -Continue Advair every day twice a day no matter what -At this point stop Aranesp and -Continue Spiriva for now - Chest x-ray to see if there is evidence of emphysema -Followup with me in 6 months  Noncompliance w/medication treatment due to intermit use of medication I explained to him at length today that he needs to use his Advair on a regular basis. Perhaps if he has no exacerbations on continuous Advair we can back off to an inhaled steroid alone.    Updated Medication List Outpatient Encounter Prescriptions as of 11/23/2013  Medication Sig  . albuterol (PROVENTIL HFA;VENTOLIN HFA) 108 (90 BASE) MCG/ACT inhaler Inhale 2 puffs into the lungs every 6 (six) hours as needed for wheezing or shortness of breath.  Marland Kitchen albuterol (PROVENTIL) (2.5 MG/3ML)  0.083% nebulizer solution Take 2.5 mg by nebulization every 6 (six) hours as needed for wheezing or shortness of breath.  Marland Kitchen aspirin 81 MG tablet Take 81 mg by mouth daily.  . Dutasteride-Tamsulosin HCl (JALYN) 0.5-0.4 MG CAPS Take 1 capsule by mouth daily.  . fexofenadine-pseudoephedrine (ALLEGRA-D 24) 180-240 MG per 24 hr tablet Take 1 tablet by mouth. Daily before allergy injections  . Flunisolide HFA (AEROSPAN) 80 MCG/ACT AERS Inhale 1 puff into the lungs 2 (two) times daily.  . Fluticasone-Salmeterol (ADVAIR) 250-50 MCG/DOSE AEPB Inhale 1 puff into the lungs 2 (two) times daily.  Marland Kitchen lisinopril-hydrochlorothiazide (PRINZIDE,ZESTORETIC) 10-12.5 MG  per tablet Take 1 tablet by mouth daily.  . predniSONE (DELTASONE) 20 MG tablet Take 20 mg by mouth daily with breakfast. Taking 50 mg daily- taking tapered dose currently 11/23/13.  Marland Kitchen rosuvastatin (CRESTOR) 20 MG tablet Take 20 mg by mouth daily.  Marland Kitchen tiotropium (SPIRIVA) 18 MCG inhalation capsule Place 1 capsule (18 mcg total) into inhaler and inhale daily.  . [DISCONTINUED] predniSONE (DELTASONE) 20 MG tablet Take 1 tablet (20 mg total) by mouth daily with breakfast.

## 2013-11-23 NOTE — Assessment & Plan Note (Addendum)
He has severe airflow obstruction which is consistent with asthma. 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  We will get a chest x-ray to see if there is evidence of emphysema.  I can't really say if he has such severe asthma that causes exacerbations with Advair because he takes the Advair sporadically. Consistent inhaler use is a big problem for him.  Plan: -Continue Advair every day twice a day no matter what -At this point stop Aranesp and -Continue Spiriva for now - Chest x-ray to see if there is evidence of emphysema -Followup with me in 6 months

## 2013-11-23 NOTE — Patient Instructions (Signed)
Take the Advair HFA through a spacer 2 puffs twice a day for the next year Ask your doctor if you have had both pneumonia shots We will see you back in 6 months or sooner if needed

## 2013-11-23 NOTE — Assessment & Plan Note (Signed)
I explained to him at length today that he needs to use his Advair on a regular basis. Perhaps if he has no exacerbations on continuous Advair we can back off to an inhaled steroid alone.

## 2013-11-24 DIAGNOSIS — J301 Allergic rhinitis due to pollen: Secondary | ICD-10-CM | POA: Diagnosis not present

## 2013-11-25 ENCOUNTER — Telehealth: Payer: Self-pay | Admitting: Pulmonary Disease

## 2013-11-25 NOTE — Progress Notes (Signed)
Quick Note:  Spoke with pt, he is aware of results. Nothing further needed. ______

## 2013-11-25 NOTE — Telephone Encounter (Signed)
Marcus Delacruz,        Please let him know this was okay        Thanks,    Ruby Cola   --------------------------------------------------------------------  [employer] with pt, he is aware of results.  Nothing further needed at this time.

## 2013-11-25 NOTE — Progress Notes (Signed)
Quick Note:  lmtcb X1 to relay results. ______ 

## 2013-11-28 DIAGNOSIS — J301 Allergic rhinitis due to pollen: Secondary | ICD-10-CM | POA: Diagnosis not present

## 2013-11-29 DIAGNOSIS — L821 Other seborrheic keratosis: Secondary | ICD-10-CM | POA: Diagnosis not present

## 2013-11-29 DIAGNOSIS — L570 Actinic keratosis: Secondary | ICD-10-CM | POA: Diagnosis not present

## 2013-11-29 DIAGNOSIS — Z1283 Encounter for screening for malignant neoplasm of skin: Secondary | ICD-10-CM | POA: Diagnosis not present

## 2013-12-08 DIAGNOSIS — J301 Allergic rhinitis due to pollen: Secondary | ICD-10-CM | POA: Diagnosis not present

## 2013-12-12 DIAGNOSIS — J45909 Unspecified asthma, uncomplicated: Secondary | ICD-10-CM | POA: Diagnosis not present

## 2013-12-12 DIAGNOSIS — J40 Bronchitis, not specified as acute or chronic: Secondary | ICD-10-CM | POA: Diagnosis not present

## 2013-12-12 DIAGNOSIS — J301 Allergic rhinitis due to pollen: Secondary | ICD-10-CM | POA: Diagnosis not present

## 2013-12-22 DIAGNOSIS — J4520 Mild intermittent asthma, uncomplicated: Secondary | ICD-10-CM | POA: Diagnosis not present

## 2013-12-22 DIAGNOSIS — J301 Allergic rhinitis due to pollen: Secondary | ICD-10-CM | POA: Diagnosis not present

## 2013-12-23 DIAGNOSIS — J301 Allergic rhinitis due to pollen: Secondary | ICD-10-CM | POA: Diagnosis not present

## 2014-01-05 DIAGNOSIS — J301 Allergic rhinitis due to pollen: Secondary | ICD-10-CM | POA: Diagnosis not present

## 2014-01-09 DIAGNOSIS — J301 Allergic rhinitis due to pollen: Secondary | ICD-10-CM | POA: Diagnosis not present

## 2014-01-16 DIAGNOSIS — J301 Allergic rhinitis due to pollen: Secondary | ICD-10-CM | POA: Diagnosis not present

## 2014-01-23 DIAGNOSIS — J301 Allergic rhinitis due to pollen: Secondary | ICD-10-CM | POA: Diagnosis not present

## 2014-01-23 DIAGNOSIS — N400 Enlarged prostate without lower urinary tract symptoms: Secondary | ICD-10-CM | POA: Diagnosis not present

## 2014-01-30 DIAGNOSIS — J301 Allergic rhinitis due to pollen: Secondary | ICD-10-CM | POA: Diagnosis not present

## 2014-02-13 DIAGNOSIS — J301 Allergic rhinitis due to pollen: Secondary | ICD-10-CM | POA: Diagnosis not present

## 2014-02-17 DIAGNOSIS — I6529 Occlusion and stenosis of unspecified carotid artery: Secondary | ICD-10-CM | POA: Diagnosis not present

## 2014-02-20 DIAGNOSIS — J301 Allergic rhinitis due to pollen: Secondary | ICD-10-CM | POA: Diagnosis not present

## 2014-02-21 DIAGNOSIS — J301 Allergic rhinitis due to pollen: Secondary | ICD-10-CM | POA: Diagnosis not present

## 2014-02-21 DIAGNOSIS — L570 Actinic keratosis: Secondary | ICD-10-CM | POA: Diagnosis not present

## 2014-02-21 DIAGNOSIS — L821 Other seborrheic keratosis: Secondary | ICD-10-CM | POA: Diagnosis not present

## 2014-02-28 DIAGNOSIS — J301 Allergic rhinitis due to pollen: Secondary | ICD-10-CM | POA: Diagnosis not present

## 2014-03-07 DIAGNOSIS — J301 Allergic rhinitis due to pollen: Secondary | ICD-10-CM | POA: Diagnosis not present

## 2014-03-13 DIAGNOSIS — J301 Allergic rhinitis due to pollen: Secondary | ICD-10-CM | POA: Diagnosis not present

## 2014-03-30 DIAGNOSIS — J301 Allergic rhinitis due to pollen: Secondary | ICD-10-CM | POA: Diagnosis not present

## 2014-04-03 DIAGNOSIS — J301 Allergic rhinitis due to pollen: Secondary | ICD-10-CM | POA: Diagnosis not present

## 2014-04-10 DIAGNOSIS — J301 Allergic rhinitis due to pollen: Secondary | ICD-10-CM | POA: Diagnosis not present

## 2014-04-13 DIAGNOSIS — E784 Other hyperlipidemia: Secondary | ICD-10-CM | POA: Diagnosis not present

## 2014-04-13 DIAGNOSIS — J3081 Allergic rhinitis due to animal (cat) (dog) hair and dander: Secondary | ICD-10-CM | POA: Diagnosis not present

## 2014-04-13 DIAGNOSIS — I10 Essential (primary) hypertension: Secondary | ICD-10-CM | POA: Diagnosis not present

## 2014-04-13 DIAGNOSIS — J4541 Moderate persistent asthma with (acute) exacerbation: Secondary | ICD-10-CM | POA: Diagnosis not present

## 2014-04-13 LAB — BASIC METABOLIC PANEL
BUN: 16 mg/dL (ref 4–21)
Creatinine: 0.9 mg/dL (ref <=1.3)
GLUCOSE: 91 mg/dL

## 2014-04-13 LAB — LIPID PANEL
CHOLESTEROL: 155 mg/dL (ref 0–200)
HDL: 54 mg/dL (ref 35–70)
LDL Cholesterol: 88 mg/dL
Triglycerides: 65 mg/dL (ref 40–160)

## 2014-04-13 LAB — HEPATIC FUNCTION PANEL
ALT: 20 U/L (ref 10–40)
AST: 59 U/L — AB (ref 14–40)

## 2014-04-18 DIAGNOSIS — J301 Allergic rhinitis due to pollen: Secondary | ICD-10-CM | POA: Diagnosis not present

## 2014-05-01 DIAGNOSIS — J301 Allergic rhinitis due to pollen: Secondary | ICD-10-CM | POA: Diagnosis not present

## 2014-05-08 DIAGNOSIS — J301 Allergic rhinitis due to pollen: Secondary | ICD-10-CM | POA: Diagnosis not present

## 2014-05-09 ENCOUNTER — Other Ambulatory Visit: Payer: Self-pay

## 2014-05-09 MED ORDER — TIOTROPIUM BROMIDE MONOHYDRATE 18 MCG IN CAPS: 18.0000 ug | ORAL_CAPSULE | Freq: Every day | RESPIRATORY_TRACT | Status: DC

## 2014-05-18 DIAGNOSIS — J301 Allergic rhinitis due to pollen: Secondary | ICD-10-CM | POA: Diagnosis not present

## 2014-05-22 DIAGNOSIS — J301 Allergic rhinitis due to pollen: Secondary | ICD-10-CM | POA: Diagnosis not present

## 2014-06-05 DIAGNOSIS — J301 Allergic rhinitis due to pollen: Secondary | ICD-10-CM | POA: Diagnosis not present

## 2014-06-12 DIAGNOSIS — J301 Allergic rhinitis due to pollen: Secondary | ICD-10-CM | POA: Diagnosis not present

## 2014-06-29 ENCOUNTER — Ambulatory Visit (INDEPENDENT_AMBULATORY_CARE_PROVIDER_SITE_OTHER): Payer: Medicare Other | Admitting: Pulmonary Disease

## 2014-06-29 ENCOUNTER — Encounter: Payer: Self-pay | Admitting: Pulmonary Disease

## 2014-06-29 DIAGNOSIS — J4550 Severe persistent asthma, uncomplicated: Secondary | ICD-10-CM

## 2014-06-29 DIAGNOSIS — J301 Allergic rhinitis due to pollen: Secondary | ICD-10-CM | POA: Diagnosis not present

## 2014-06-29 LAB — PULMONARY FUNCTION TEST
FEF 25-75 Post: 1.77 L/sec
FEF 25-75 Pre: 0.6 L/sec
FEF2575-%Change-Post: 196 %
FEF2575-%PRED-POST: 75 %
FEF2575-%Pred-Pre: 25 %
FEV1-%Change-Post: 40 %
FEV1-%Pred-Post: 60 %
FEV1-%Pred-Pre: 43 %
FEV1-Post: 1.92 L
FEV1-Pre: 1.37 L
FEV1FVC-%Change-Post: 12 %
FEV1FVC-%Pred-Pre: 81 %
FEV6-%Change-Post: 25 %
FEV6-%Pred-Post: 68 %
FEV6-%Pred-Pre: 54 %
FEV6-POST: 2.8 L
FEV6-Pre: 2.24 L
FEV6FVC-%Change-Post: 1 %
FEV6FVC-%Pred-Post: 105 %
FEV6FVC-%Pred-Pre: 103 %
FVC-%Change-Post: 24 %
FVC-%PRED-POST: 66 %
FVC-%PRED-PRE: 53 %
FVC-Post: 2.86 L
FVC-Pre: 2.31 L
PRE FEV1/FVC RATIO: 59 %
Post FEV1/FVC ratio: 67 %
Post FEV6/FVC ratio: 99 %
Pre FEV6/FVC Ratio: 97 %

## 2014-06-29 MED ORDER — MONTELUKAST SODIUM 10 MG PO TABS: 10.0000 mg | ORAL_TABLET | Freq: Every day | ORAL | Status: DC

## 2014-06-29 MED ORDER — FLUTICASONE-SALMETEROL 230-21 MCG/ACT IN AERO: 2.0000 | INHALATION_SPRAY | Freq: Two times a day (BID) | RESPIRATORY_TRACT | Status: DC

## 2014-06-29 NOTE — Assessment & Plan Note (Signed)
Marcus Delacruz has poorly controlled asthma based on his high use of albuterol, and severity of his symptoms. Unfortunately, medication noncompliance continues to be a problem. Today we spent an extensive amount of time discussing the importance of using the Advair appropriately. He is only using about one quarter of the dose that he should be using.  Plan: -Perform spirometry pre-and post bronchodilator today -Increase Advair to maximum dose, use 2 puffs twice a day no matter how he feels -Use Singulair 1 pill daily -Follow-up in 6 weeks, if no improvement then we will consider either Xolair or enrollment in a clinical trial for asthma

## 2014-06-29 NOTE — Progress Notes (Signed)
Subjective:    Patient ID: Marcus Delacruz, male    DOB: 1940-11-28, 74 y.o.   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 Long history of allergies and currently on immunotherapy as of 2015. Smoked one pack of cigarettes daily for 25 years.  HPI  11/23/2013 ROV >  Chief Complaint  Patient presents with  . Follow-up    pt c/o sinus congestion, mostly nonprod cough- sums this up to to seasonal allergies.  pt still having sob with exertion and when first waking up in the mornings.     Nichlas has been doing OK.  He says that his allergies (runny nose, itchy eyes) are better, but his breathing seems a little worse.  He says that he feels like he can't take a deep breath and he is taking his nebulizer a lot which helps.  He is taking the albuterol 5-7 times a day, he also takes it at night probably 2-3 times per day.  He has been experiencing more dyspnea and wheezing.  No new changes in the house (no water, cats, dogs).    He is taking Spiriva daily but the Advair he only takes once a day due to trouble he has with it on his voice.  He says he really only takes one puff of Advair a day. He says that he really has not had trouble with the Advair HFA, he just "fears that it's going to give him trouble".  He quit smoking over 30 years ago.  Past Medical History  Diagnosis Date  . Hypertension   . Hypercholesteremia      Review of Systems  Constitutional: Negative for fever, chills and fatigue.  HENT: Negative for postnasal drip, rhinorrhea and sinus pressure.   Respiratory: Negative for cough, shortness of breath and wheezing.   Cardiovascular: Negative for chest pain, palpitations and leg swelling.       Objective:   Physical Exam Filed Vitals:   06/29/14 0918  BP: 144/74  Pulse: 68  Height: 5\' 10"  (1.778 m)  Weight: 177 lb (80.287 kg)  SpO2: 96%    RA  Gen: well appearing, no acute distress HEENT: NCAT, EOMi, OP clear,  PULM: Few  crackles in bases, CV: RRR, no mgr, no JVD GI: BS+, soft, nontender Musculoskeletal: Normal bulk and tone Derm: no rash or skin breakdown Neuro: A&Ox4, MAEW  CXR reviewed again today in clinic (2015 study)> no significant airspace disease    Assessment & Plan:   Asthma, chronic Mackay has poorly controlled asthma based on his high use of albuterol, and severity of his symptoms. Unfortunately, medication noncompliance continues to be a problem. Today we spent an extensive amount of time discussing the importance of using the Advair appropriately. He is only using about one quarter of the dose that he should be using.  Plan: -Perform spirometry pre-and post bronchodilator today -Increase Advair to maximum dose, use 2 puffs twice a day no matter how he feels -Use Singulair 1 pill daily -Follow-up in 6 weeks, if no improvement then we will consider either Xolair or enrollment in a clinical trial for asthma   Noncompliance w/medication treatment due to intermit use of medication This continues to be a problem. Spent a significant amount of time today discussing the importance of using his inhalers appropriately.     Updated Medication List Outpatient Encounter Prescriptions as of 06/29/2014  Medication Sig  . albuterol (PROVENTIL HFA;VENTOLIN HFA) 108 (90 BASE) MCG/ACT inhaler Inhale 2 puffs  into the lungs every 6 (six) hours as needed for wheezing or shortness of breath.  Marland Kitchen albuterol (PROVENTIL) (2.5 MG/3ML) 0.083% nebulizer solution Take 2.5 mg by nebulization every 6 (six) hours as needed for wheezing or shortness of breath.  Marland Kitchen aspirin 81 MG tablet Take 81 mg by mouth daily.  . Dutasteride-Tamsulosin HCl (JALYN) 0.5-0.4 MG CAPS Take 1 capsule by mouth daily.  . fexofenadine-pseudoephedrine (ALLEGRA-D 24) 180-240 MG per 24 hr tablet Take 1 tablet by mouth. Daily before allergy injections  . lisinopril-hydrochlorothiazide (PRINZIDE,ZESTORETIC) 10-12.5 MG per tablet Take 1 tablet by  mouth daily.  . rosuvastatin (CRESTOR) 20 MG tablet Take 20 mg by mouth daily.  Marland Kitchen Spacer/Aero-Holding Chambers (AEROCHAMBER MV) inhaler Use as instructed with inhaler.  DX code 493.90  . tiotropium (SPIRIVA) 18 MCG inhalation capsule Place 1 capsule (18 mcg total) into inhaler and inhale daily.  . [DISCONTINUED] fluticasone-salmeterol (ADVAIR HFA) 115-21 MCG/ACT inhaler Inhale 2 puffs into the lungs 2 (two) times daily.  . fluticasone-salmeterol (ADVAIR HFA) 230-21 MCG/ACT inhaler Inhale 2 puffs into the lungs 2 (two) times daily.  . montelukast (SINGULAIR) 10 MG tablet Take 1 tablet (10 mg total) by mouth daily.  . [DISCONTINUED] predniSONE (DELTASONE) 20 MG tablet Take 20 mg by mouth daily with breakfast. Taking 50 mg daily- taking tapered dose currently 11/23/13.

## 2014-06-29 NOTE — Progress Notes (Signed)
Pre and Post Spirometry per Dr. Lake Bells on PFT machine.

## 2014-06-29 NOTE — Assessment & Plan Note (Signed)
This continues to be a problem. Spent a significant amount of time today discussing the importance of using his inhalers appropriately.

## 2014-06-29 NOTE — Patient Instructions (Signed)
We will see the results of your spirometry today and call you with the results Taking Spiriva daily Start using the increased dose of Advair 2 puffs twice a day with a spacer  Start using Singulair 1 pill daily no matter how you feel  We will see you back in 6 weeks or sooner if needed

## 2014-07-10 DIAGNOSIS — J301 Allergic rhinitis due to pollen: Secondary | ICD-10-CM | POA: Diagnosis not present

## 2014-07-10 DIAGNOSIS — Z0000 Encounter for general adult medical examination without abnormal findings: Secondary | ICD-10-CM | POA: Diagnosis not present

## 2014-07-10 DIAGNOSIS — Z1389 Encounter for screening for other disorder: Secondary | ICD-10-CM | POA: Diagnosis not present

## 2014-07-10 DIAGNOSIS — M129 Arthropathy, unspecified: Secondary | ICD-10-CM | POA: Diagnosis not present

## 2014-07-10 LAB — CBC AND DIFFERENTIAL: HEMOGLOBIN: 15.7 g/dL (ref 13.5–17.5)

## 2014-07-10 LAB — FECAL OCCULT BLOOD, GUAIAC: Fecal Occult Blood: POSITIVE

## 2014-07-14 DIAGNOSIS — J3081 Allergic rhinitis due to animal (cat) (dog) hair and dander: Secondary | ICD-10-CM | POA: Insufficient documentation

## 2014-07-14 DIAGNOSIS — J45901 Unspecified asthma with (acute) exacerbation: Secondary | ICD-10-CM | POA: Insufficient documentation

## 2014-07-14 DIAGNOSIS — E7849 Other hyperlipidemia: Secondary | ICD-10-CM | POA: Insufficient documentation

## 2014-07-14 DIAGNOSIS — Z1331 Encounter for screening for depression: Secondary | ICD-10-CM | POA: Insufficient documentation

## 2014-07-14 DIAGNOSIS — I10 Essential (primary) hypertension: Secondary | ICD-10-CM | POA: Insufficient documentation

## 2014-07-14 DIAGNOSIS — Z0000 Encounter for general adult medical examination without abnormal findings: Secondary | ICD-10-CM | POA: Insufficient documentation

## 2014-07-14 DIAGNOSIS — M129 Arthropathy, unspecified: Secondary | ICD-10-CM | POA: Insufficient documentation

## 2014-07-17 DIAGNOSIS — J301 Allergic rhinitis due to pollen: Secondary | ICD-10-CM | POA: Diagnosis not present

## 2014-07-26 DIAGNOSIS — H2513 Age-related nuclear cataract, bilateral: Secondary | ICD-10-CM | POA: Diagnosis not present

## 2014-07-27 DIAGNOSIS — J301 Allergic rhinitis due to pollen: Secondary | ICD-10-CM | POA: Diagnosis not present

## 2014-08-01 DIAGNOSIS — J301 Allergic rhinitis due to pollen: Secondary | ICD-10-CM | POA: Diagnosis not present

## 2014-08-09 ENCOUNTER — Ambulatory Visit: Payer: No Typology Code available for payment source | Admitting: Pulmonary Disease

## 2014-08-11 ENCOUNTER — Ambulatory Visit: Payer: Medicare Other | Admitting: Pulmonary Disease

## 2014-08-17 DIAGNOSIS — L570 Actinic keratosis: Secondary | ICD-10-CM | POA: Diagnosis not present

## 2014-08-17 DIAGNOSIS — D485 Neoplasm of uncertain behavior of skin: Secondary | ICD-10-CM | POA: Diagnosis not present

## 2014-08-17 DIAGNOSIS — I6529 Occlusion and stenosis of unspecified carotid artery: Secondary | ICD-10-CM | POA: Diagnosis not present

## 2014-08-17 DIAGNOSIS — L739 Follicular disorder, unspecified: Secondary | ICD-10-CM | POA: Diagnosis not present

## 2014-08-17 DIAGNOSIS — Z1283 Encounter for screening for malignant neoplasm of skin: Secondary | ICD-10-CM | POA: Diagnosis not present

## 2014-08-17 DIAGNOSIS — L439 Lichen planus, unspecified: Secondary | ICD-10-CM | POA: Diagnosis not present

## 2014-08-21 DIAGNOSIS — J301 Allergic rhinitis due to pollen: Secondary | ICD-10-CM | POA: Diagnosis not present

## 2014-09-05 DIAGNOSIS — J449 Chronic obstructive pulmonary disease, unspecified: Secondary | ICD-10-CM | POA: Diagnosis not present

## 2014-09-05 DIAGNOSIS — J301 Allergic rhinitis due to pollen: Secondary | ICD-10-CM | POA: Diagnosis not present

## 2014-09-05 DIAGNOSIS — R0602 Shortness of breath: Secondary | ICD-10-CM | POA: Diagnosis not present

## 2014-09-05 DIAGNOSIS — R0683 Snoring: Secondary | ICD-10-CM | POA: Diagnosis not present

## 2014-09-06 ENCOUNTER — Other Ambulatory Visit: Payer: Self-pay | Admitting: Internal Medicine

## 2014-09-06 DIAGNOSIS — R0602 Shortness of breath: Secondary | ICD-10-CM

## 2014-09-08 ENCOUNTER — Other Ambulatory Visit: Payer: Self-pay

## 2014-09-08 MED ORDER — MONTELUKAST SODIUM 10 MG PO TABS: 10.0000 mg | ORAL_TABLET | Freq: Every day | ORAL | Status: DC

## 2014-09-12 ENCOUNTER — Ambulatory Visit: Payer: Medicare Other | Admitting: Pulmonary Disease

## 2014-09-14 ENCOUNTER — Ambulatory Visit
Admission: RE | Admit: 2014-09-14 | Discharge: 2014-09-14 | Disposition: A | Discharge status: Home / Self Care | Payer: Medicare Other | Source: Ambulatory Visit | Attending: Internal Medicine | Admitting: Internal Medicine

## 2014-09-14 DIAGNOSIS — I2510 Atherosclerotic heart disease of native coronary artery without angina pectoris: Secondary | ICD-10-CM | POA: Diagnosis not present

## 2014-09-14 DIAGNOSIS — J8410 Pulmonary fibrosis, unspecified: Secondary | ICD-10-CM | POA: Diagnosis not present

## 2014-09-14 DIAGNOSIS — K760 Fatty (change of) liver, not elsewhere classified: Secondary | ICD-10-CM | POA: Diagnosis not present

## 2014-09-14 DIAGNOSIS — I700 Atherosclerosis of aorta: Secondary | ICD-10-CM | POA: Insufficient documentation

## 2014-09-14 DIAGNOSIS — R0602 Shortness of breath: Secondary | ICD-10-CM

## 2014-09-14 DIAGNOSIS — J301 Allergic rhinitis due to pollen: Secondary | ICD-10-CM | POA: Diagnosis not present

## 2014-09-14 DIAGNOSIS — R918 Other nonspecific abnormal finding of lung field: Secondary | ICD-10-CM | POA: Diagnosis present

## 2014-09-14 DIAGNOSIS — J984 Other disorders of lung: Secondary | ICD-10-CM | POA: Diagnosis not present

## 2014-09-14 MED ORDER — IOHEXOL 350 MG/ML SOLN
100.0000 mL | Freq: Once | INTRAVENOUS | Status: AC | PRN
  Administered 2014-09-14: 100 mL via INTRAVENOUS

## 2014-09-15 ENCOUNTER — Ambulatory Visit: Payer: Medicare Other | Admitting: Pulmonary Disease

## 2014-10-02 DIAGNOSIS — J301 Allergic rhinitis due to pollen: Secondary | ICD-10-CM | POA: Diagnosis not present

## 2014-10-03 ENCOUNTER — Ambulatory Visit: Payer: Self-pay | Admitting: Urology

## 2014-10-04 DIAGNOSIS — R0602 Shortness of breath: Secondary | ICD-10-CM | POA: Diagnosis not present

## 2014-10-05 ENCOUNTER — Ambulatory Visit (INDEPENDENT_AMBULATORY_CARE_PROVIDER_SITE_OTHER): Payer: Medicare Other | Admitting: Urology

## 2014-10-05 ENCOUNTER — Encounter: Payer: Self-pay | Admitting: Urology

## 2014-10-05 VITALS — BP 153/76 | HR 76 | Ht 70.0 in | Wt 183.1 lb

## 2014-10-05 DIAGNOSIS — R0602 Shortness of breath: Secondary | ICD-10-CM | POA: Diagnosis not present

## 2014-10-05 DIAGNOSIS — N503 Cyst of epididymis: Secondary | ICD-10-CM

## 2014-10-05 DIAGNOSIS — N401 Enlarged prostate with lower urinary tract symptoms: Secondary | ICD-10-CM | POA: Diagnosis not present

## 2014-10-05 DIAGNOSIS — N138 Other obstructive and reflux uropathy: Secondary | ICD-10-CM | POA: Insufficient documentation

## 2014-10-05 LAB — BLADDER SCAN AMB NON-IMAGING: Scan Result: 143

## 2014-10-05 MED ORDER — FINASTERIDE 5 MG PO TABS: 5.0000 mg | ORAL_TABLET | Freq: Every day | ORAL | Status: DC

## 2014-10-05 NOTE — Progress Notes (Signed)
10/05/2014 2:24 PM   [name] 1940-09-23 [id number]  Referring provider: Juline Patch, MD [address] [place],  [address]  Chief Complaint  Patient presents with  . Benign Prostatic Hypertrophy    6 month F/U    HPI: Mr. Trimarco is a 74 year old white male with BPH with LUTS who presents today for a 6 month follow up.    His IPSS score today is 1, which is mild lower urinary tract symptomatology. He is delighted with his quality life due to his urinary symptoms. His PVR is 143 mL.    He has no urinary complaints at this time.  He has had these symptoms for 4 years.  He denies any dysuria, hematuria or suprapubic pain.   He currently taking Jalyn, but his insurance is requesting the patient try finasteride.  His has not had any prostate procedures.  Previous PSA's:     0.4 ng/mL on 01/27/2013     0.4 ng/mL on 07/27/2013     0.6 ng/mL on 01/23/2014  He also denies any recent fevers, chills, nausea or vomiting.  He has a family history of PCa, with his brother having PCa.      IPSS      10/05/14 0900       International Prostate Symptom Score   How often have you had the sensation of not emptying your bladder? Not at All     How often have you had to urinate less than every two hours? Not at All     How often have you found you stopped and started again several times when you urinated? Not at All     How often have you found it difficult to postpone urination? Not at All     How often have you had a weak urinary stream? Not at All     How often have you had to strain to start urination? Not at All     How many times did you typically get up at night to urinate? 1 Time     Total IPSS Score 1     Quality of Life due to urinary symptoms   If you were to spend the rest of your life with your urinary condition just the way it is now how would you feel about that? Delighted        Score:  1-7 Mild 8-19 Moderate 20-35  Severe   Patient having right groin pain that radiates to the right flank area.  He has not a previous stone history.  This started two weeks ago.  It is made worse with movement.  He has noticed rest relieves the pain.  He has not had any scrotal swelling or inguinal bulging.    PMH: Past Medical History  Diagnosis Date  . Hypertension   . Hypercholesteremia     Surgical History: Past Surgical History  Procedure Laterality Date  . Hernia repair    . Artery repair    . Cholecystectomy      Home Medications:    Medication List       This list is accurate as of: 10/05/14  2:24 PM.  Always use your most recent med list.               AEROCHAMBER MV inhaler  Use as instructed with inhaler.  DX code 493.90     albuterol 108 (90 BASE) MCG/ACT inhaler  Commonly known as:  PROVENTIL HFA;VENTOLIN HFA  Inhale 2 puffs into the lungs every 6 (six) hours as needed for wheezing or shortness of breath.     aspirin 81 MG tablet  Take 81 mg by mouth daily.     fexofenadine-pseudoephedrine 180-240 MG per 24 hr tablet  Commonly known as:  ALLEGRA-D 24  Take 1 tablet by mouth. Daily before allergy injections     finasteride 5 MG tablet  Commonly known as:  PROSCAR  Take 1 tablet (5 mg total) by mouth daily.     Flunisolide HFA 80 MCG/ACT Aers  Inhale 1 puff into the lungs as needed.     fluorouracil 5 % cream  Commonly known as:  EFUDEX     FLUTICASONE-SALMETEROL  Inhale 1 spray into the lungs daily.     fluticasone-salmeterol 230-21 MCG/ACT inhaler  Commonly known as:  ADVAIR HFA  Inhale 2 puffs into the lungs 2 (two) times daily.     ibuprofen 800 MG tablet  Commonly known as:  ADVIL,MOTRIN  Take 1 tablet by mouth 3 (three) times daily.     JALYN 0.5-0.4 MG Caps  Generic drug:  Dutasteride-Tamsulosin HCl  Take 1 capsule by mouth daily.     lisinopril-hydrochlorothiazide 10-12.5 MG per tablet  Commonly known as:  PRINZIDE,ZESTORETIC  Take 1 tablet by mouth daily.      montelukast 10 MG tablet  Commonly known as:  SINGULAIR  Take 1 tablet by mouth daily.     rosuvastatin 20 MG tablet  Commonly known as:  CRESTOR  Take 20 mg by mouth daily.     tiotropium 18 MCG inhalation capsule  Commonly known as:  SPIRIVA  Place 1 capsule (18 mcg total) into inhaler and inhale daily.        Allergies:  Allergies  Allergen Reactions  . Codeine Itching    Family History: Family History  Problem Relation Age of Onset  . COPD Mother   . Asthma Son   . Cancer Father     lung    Social History:  reports that he quit smoking about 31 years ago. His smoking use included Cigarettes. He has a 20 pack-year smoking history. He has never used smokeless tobacco. He reports that he drinks alcohol. He reports that he does not use illicit drugs.  ROS: UROLOGY Frequent Urination?: No Hard to postpone urination?: No Burning/pain with urination?: No Get up at night to urinate?: No Leakage of urine?: No Urine stream starts and stops?: No Trouble starting stream?: No Do you have to strain to urinate?: No Blood in urine?: No Urinary tract infection?: No Sexually transmitted disease?: No Injury to kidneys or bladder?: No Painful intercourse?: No Weak stream?: No Erection problems?: No Penile pain?: No  Gastrointestinal Nausea?: No Vomiting?: No Indigestion/heartburn?: No Diarrhea?: No Constipation?: No  Constitutional Fever: No Night sweats?: No Weight loss?: No Fatigue?: No  Skin Skin rash/lesions?: No Itching?: No  Eyes Blurred vision?: No Double vision?: No  Ears/Nose/Throat Sore throat?: No Sinus problems?: No  Hematologic/Lymphatic Swollen glands?: No Easy bruising?: No  Cardiovascular Leg swelling?: No Chest pain?: No  Respiratory Cough?: No Shortness of breath?: Yes  Endocrine Excessive thirst?: No  Musculoskeletal Back pain?: Yes Joint pain?: No  Neurological Headaches?: No Dizziness?:  No  Psychologic Depression?: No Anxiety?: No  Physical Exam: BP 153/76 mmHg  Pulse 76  Ht 5\' 10"  (1.778 m)  Wt 183 lb 1.6 oz (83.054 kg)  BMI 26.27 kg/m2  GU: Patient with circumcised phallus.  Urethral meatus is patent.  No penile discharge. No penile lesions or rashes. Scrotum without lesions, cysts, rashes and/or edema.  Testicles are located scrotally bilaterally. No masses are appreciated in the testicles. Left epididymis is normal.  Right epididymal cyst.    Rectal: Patient with  normal sphincter tone. Perineum without scarring or rashes. No rectal masses are appreciated. Prostate is approximately  50 grams, no nodules are appreciated. Seminal vesicles are normal.   Laboratory Data: Results for orders placed or performed in visit on 10/05/14  Bladder Scan (Post Void Residual) in office  Result Value Ref Range   Scan Result 143    Lab Results  Component Value Date   WBC 8.2 08/08/2013   HGB 15.7 07/10/2014   HCT 43.3 08/08/2013   MCV 86.7 08/08/2013   PLT 354.0 08/08/2013    Lab Results  Component Value Date   CREATININE 0.9 04/13/2014    No results found for: PSA  No results found for: TESTOSTERONE  No results found for: HGBA1C  Urinalysis No results found for: COLORURINE, APPEARANCEUR, LABSPEC, PHURINE, GLUCOSEU, HGBUR, BILIRUBINUR, KETONESUR, PROTEINUR, UROBILINOGEN, NITRITE, LEUKOCYTESUR  Pertinent Imaging:   Assessment & Plan:    1. BPH (benign prostatic hyperplasia) with LUTS:   Patient's symptoms are well controlled on Jalyn, but his insurance is requiring a medication change.  They are suggesting finasteride.  I explained to the patient that Sharyne Richters contains two medications.  (Flomax and Avodart)  I will prescribe the finasteride, but the patient is advised that we may need to add tamsulosin if his urinary symptoms worsen.    His IPSS 1/0.  PVR is 143 mL.    Patient's PSA will be drawn at his next appointment to discuss his scrotal US results.  -  Bladder Scan (Post Void Residual) in office  2. Epididymal cyst:   Patient had the sudden onset of right scrotal pain.  I have felt an area in the right epididymis which is new.  I will schedule a scrotal US for further evaluation.  Patient will return to discuss the results.     Return for scrotal ultrasound report.  Zara Council, [facility] [address], [place], [place] [address] [phone]

## 2014-10-10 ENCOUNTER — Ambulatory Visit
Admission: RE | Admit: 2014-10-10 | Discharge: 2014-10-10 | Disposition: A | Discharge status: Home / Self Care | Payer: Medicare Other | Source: Ambulatory Visit | Attending: Urology | Admitting: Urology

## 2014-10-10 DIAGNOSIS — I517 Cardiomegaly: Secondary | ICD-10-CM | POA: Diagnosis not present

## 2014-10-10 DIAGNOSIS — J449 Chronic obstructive pulmonary disease, unspecified: Secondary | ICD-10-CM | POA: Diagnosis not present

## 2014-10-10 DIAGNOSIS — G4710 Hypersomnia, unspecified: Secondary | ICD-10-CM | POA: Diagnosis not present

## 2014-10-10 DIAGNOSIS — N503 Cyst of epididymis: Secondary | ICD-10-CM

## 2014-10-10 DIAGNOSIS — J301 Allergic rhinitis due to pollen: Secondary | ICD-10-CM | POA: Diagnosis not present

## 2014-10-10 DIAGNOSIS — N433 Hydrocele, unspecified: Secondary | ICD-10-CM | POA: Insufficient documentation

## 2014-10-10 DIAGNOSIS — R911 Solitary pulmonary nodule: Secondary | ICD-10-CM | POA: Diagnosis not present

## 2014-10-19 DIAGNOSIS — J301 Allergic rhinitis due to pollen: Secondary | ICD-10-CM | POA: Diagnosis not present

## 2014-10-26 DIAGNOSIS — J301 Allergic rhinitis due to pollen: Secondary | ICD-10-CM | POA: Diagnosis not present

## 2014-10-29 ENCOUNTER — Telehealth: Payer: Self-pay | Admitting: Urology

## 2014-10-29 NOTE — Telephone Encounter (Signed)
Patient needed an office visit to discuss his scrotal ultrasound report and a PSA draw. Please schedule the patient an appointment.

## 2014-10-31 NOTE — Telephone Encounter (Signed)
LMOM

## 2014-11-01 ENCOUNTER — Telehealth: Payer: Self-pay | Admitting: Urology

## 2014-11-01 DIAGNOSIS — G4710 Hypersomnia, unspecified: Secondary | ICD-10-CM | POA: Diagnosis not present

## 2014-11-01 NOTE — Telephone Encounter (Signed)
Would you call the patient to reschedule his scrotal US?

## 2014-11-01 NOTE — Telephone Encounter (Signed)
Spoke with pt in reference to scrotal u/s results. Pt was transferred to the front to make f/u appt.

## 2014-11-02 NOTE — Telephone Encounter (Signed)
Do we do this?

## 2014-11-06 ENCOUNTER — Other Ambulatory Visit: Payer: Self-pay | Admitting: Family Medicine

## 2014-11-06 DIAGNOSIS — J301 Allergic rhinitis due to pollen: Secondary | ICD-10-CM | POA: Diagnosis not present

## 2014-11-06 DIAGNOSIS — I10 Essential (primary) hypertension: Secondary | ICD-10-CM

## 2014-11-08 ENCOUNTER — Encounter: Payer: Self-pay | Admitting: Urology

## 2014-11-14 ENCOUNTER — Encounter: Payer: Self-pay | Admitting: *Deleted

## 2014-11-16 DIAGNOSIS — J301 Allergic rhinitis due to pollen: Secondary | ICD-10-CM | POA: Diagnosis not present

## 2014-11-20 ENCOUNTER — Ambulatory Visit (INDEPENDENT_AMBULATORY_CARE_PROVIDER_SITE_OTHER): Payer: Medicare Other | Admitting: Urology

## 2014-11-20 ENCOUNTER — Encounter: Payer: Self-pay | Admitting: Urology

## 2014-11-20 VITALS — BP 138/73 | HR 71 | Resp 16 | Ht 70.0 in | Wt 181.5 lb

## 2014-11-20 DIAGNOSIS — N433 Hydrocele, unspecified: Secondary | ICD-10-CM

## 2014-11-20 DIAGNOSIS — N401 Enlarged prostate with lower urinary tract symptoms: Secondary | ICD-10-CM

## 2014-11-20 DIAGNOSIS — N400 Enlarged prostate without lower urinary tract symptoms: Secondary | ICD-10-CM | POA: Diagnosis not present

## 2014-11-20 DIAGNOSIS — N138 Other obstructive and reflux uropathy: Secondary | ICD-10-CM

## 2014-11-20 DIAGNOSIS — J440 Chronic obstructive pulmonary disease with acute lower respiratory infection: Secondary | ICD-10-CM | POA: Diagnosis not present

## 2014-11-20 DIAGNOSIS — G4739 Other sleep apnea: Secondary | ICD-10-CM | POA: Diagnosis not present

## 2014-11-20 DIAGNOSIS — J301 Allergic rhinitis due to pollen: Secondary | ICD-10-CM | POA: Diagnosis not present

## 2014-11-20 NOTE — Progress Notes (Signed)
11/20/2014 9:10 AM   Marcus Delacruz 1940-11-20 [id number]  Referring provider: Juline Patch, MD [address] [place], [place] [address]  Chief Complaint  Patient presents with  . Results    [place]  . Benign Prostatic Hypertrophy    HPI: Patient is 74 year old white male who presents today to discuss a scrotal ultrasound report and obtain a PSA.  Patient has no urological complaints at this time.  His IPSS score is last visit was 1. His most recent PSA was 0.5 ng/mL on 11/20/2014.    Scrotal ultrasound completed on 10/10/2014 noted small simple bilateral hydroceles.  I have reviewed the films with the patient and his wife.  PMH: Past Medical History  Diagnosis Date  . Hypertension   . Hypercholesteremia   . BPH (benign prostatic hyperplasia)     Surgical History: Past Surgical History  Procedure Laterality Date  . Hernia repair    . Artery repair    . Cholecystectomy      Home Medications:    Medication List       This list is accurate as of: 11/20/14 11:59 PM.  Always use your most recent med list.               AEROCHAMBER MV inhaler  Use as instructed with inhaler.  DX code 493.90     albuterol 108 (90 BASE) MCG/ACT inhaler  Commonly known as:  PROVENTIL HFA;VENTOLIN HFA  Inhale 2 puffs into the lungs every 6 (six) hours as needed for wheezing or shortness of breath.     aspirin 81 MG tablet  Take 81 mg by mouth daily.     fexofenadine-pseudoephedrine 180-240 MG per 24 hr tablet  Commonly known as:  ALLEGRA-D 24  Take 1 tablet by mouth. Daily before allergy injections     finasteride 5 MG tablet  Commonly known as:  PROSCAR  Take 1 tablet (5 mg total) by mouth daily.     Flunisolide HFA 80 MCG/ACT Aers  Inhale 1 puff into the lungs as needed.     fluorouracil 5 % cream  Commonly known as:  EFUDEX     FLUTICASONE-SALMETEROL  Inhale 1 spray into the lungs daily.     ibuprofen 800 MG tablet  Commonly known as:  ADVIL,MOTRIN    Take 1 tablet by mouth 3 (three) times daily.     JALYN 0.5-0.4 MG Caps  Generic drug:  Dutasteride-Tamsulosin HCl  Take 1 capsule by mouth daily.     lisinopril-hydrochlorothiazide 10-12.5 MG per tablet  Commonly known as:  PRINZIDE,ZESTORETIC  TAKE ONE (1) TABLET BY MOUTH ONCE DAILY     montelukast 10 MG tablet  Commonly known as:  SINGULAIR  Take 1 tablet by mouth daily.     rosuvastatin 20 MG tablet  Commonly known as:  CRESTOR  Take 20 mg by mouth daily.     tiotropium 18 MCG inhalation capsule  Commonly known as:  SPIRIVA  Place 1 capsule (18 mcg total) into inhaler and inhale daily.        Allergies:  Allergies  Allergen Reactions  . Codeine Itching    Family History: Family History  Problem Relation Age of Onset  . COPD Mother   . Asthma Son   . Cancer Father     lung  . Prostate cancer Brother     Social History:  reports that he quit smoking about 31 years ago. His smoking use included Cigarettes. He has a 20 pack-year  smoking history. He has never used smokeless tobacco. He reports that he drinks alcohol. He reports that he does not use illicit drugs.  ROS: UROLOGY Frequent Urination?: No Hard to postpone urination?: No Burning/pain with urination?: No Get up at night to urinate?: No Leakage of urine?: No Urine stream starts and stops?: No Trouble starting stream?: No Do you have to strain to urinate?: No Blood in urine?: No Urinary tract infection?: No Sexually transmitted disease?: No Injury to kidneys or bladder?: No Painful intercourse?: No Weak stream?: No Erection problems?: No Penile pain?: No  Gastrointestinal Nausea?: No Vomiting?: No Indigestion/heartburn?: No Diarrhea?: No Constipation?: No  Constitutional Fever: No Night sweats?: No Weight loss?: No Fatigue?: No  Skin Skin rash/lesions?: No Itching?: No  Eyes Blurred vision?: No Double vision?: No  Ears/Nose/Throat Sore throat?: No Sinus problems?:  No  Hematologic/Lymphatic Swollen glands?: No Easy bruising?: No  Cardiovascular Leg swelling?: No Chest pain?: No  Respiratory Cough?: No Shortness of breath?: Yes  Endocrine Excessive thirst?: No  Musculoskeletal Back pain?: No Joint pain?: No  Neurological Headaches?: No Dizziness?: No  Psychologic Depression?: No Anxiety?: No  Physical Exam: BP 138/73 mmHg  Pulse 71  Resp 16  Ht 5\' 10"  (1.778 m)  Wt 181 lb 8 oz (82.328 kg)  BMI 26.04 kg/m2   Laboratory Data: Lab Results  Component Value Date   WBC 8.2 08/08/2013   HGB 15.7 07/10/2014   HCT 43.3 08/08/2013   MCV 86.7 08/08/2013   PLT 354.0 08/08/2013    Lab Results  Component Value Date   CREATININE 0.9 04/13/2014    Lab Results  Component Value Date   PSA 0.5 11/20/2014   Pertinent Imaging: CLINICAL DATA: 74 year old male with right testicle swelling. Initial encounter.  EXAM: SCROTAL ULTRASOUND  DOPPLER ULTRASOUND OF THE TESTICLES  TECHNIQUE: Complete ultrasound examination of the testicles, epididymis, and other scrotal structures was performed. Color and spectral Doppler ultrasound were also utilized to evaluate blood flow to the testicles.  COMPARISON: None.  FINDINGS: Right testicle  Measurements: 4.0 x 1.6 x 2.5 cm. No mass or microlithiasis visualized.  Left testicle  Measurements: 3.5 x 1.5 x 2.6 cm. No mass or microlithiasis visualized.  Right epididymis: Within normal limits in size and appearance.  Left epididymis: Within normal limits in size and appearance.  Hydrocele: Small and relatively symmetric simple appearing bilateral hydroceles  Varicocele: None visualized.  Pulsed Doppler interrogation of both testes demonstrates normal low resistance arterial and venous waveforms bilaterally.  IMPRESSION: No evidence of testicular torsion and negative scrotal ultrasound aside from small simple appearing bilateral  hydroceles.   Electronically Signed  By: [name] M.D.  On: 10/10/2014 15:30  Assessment & Plan:    1. BPH (benign prostatic hyperplasia) with LUTS: Patient's symptoms are well controlled on Jalyn, but his insurance is requiring a medication change. They are suggesting finasteride. I explained to the patient that Sharyne Richters contains two medications. (Flomax and Avodart) I  prescribed the finasteride, but the patient is advised that we may need to add tamsulosin if his urinary symptoms worsen.   His IPSS 1/0. PVR is 143 mL at his last visit.  - PSA  2. Bilateral hydroceles: Patient will manage his hydroceles conservatively.     Return in about 1 year (around 11/20/2015) for IPSS.  Zara Council, [facility] [address], [place], [place] [address] [phone]

## 2014-11-21 ENCOUNTER — Telehealth: Payer: Self-pay

## 2014-11-21 DIAGNOSIS — J301 Allergic rhinitis due to pollen: Secondary | ICD-10-CM | POA: Diagnosis not present

## 2014-11-21 LAB — PSA: Prostate Specific Ag, Serum: 0.5 ng/mL (ref 0.0–4.0)

## 2014-11-21 NOTE — Telephone Encounter (Signed)
-----   Message from Nori Riis, PA-C sent at 11/21/2014  1:03 PM EDT ----- PSA is good.  We will see him in one year.

## 2014-11-21 NOTE — Telephone Encounter (Signed)
LMOM

## 2014-11-22 DIAGNOSIS — N433 Hydrocele, unspecified: Secondary | ICD-10-CM | POA: Insufficient documentation

## 2014-11-22 NOTE — Telephone Encounter (Signed)
Spoke with pt in reference to PSA results. Pt voiced understanding.  

## 2014-11-22 NOTE — Telephone Encounter (Signed)
-----   Message from Nori Riis, PA-C sent at 11/21/2014  1:03 PM EDT ----- PSA is good.  We will see him in one year.

## 2014-12-07 ENCOUNTER — Other Ambulatory Visit: Payer: Self-pay | Admitting: Family Medicine

## 2014-12-07 DIAGNOSIS — E785 Hyperlipidemia, unspecified: Secondary | ICD-10-CM

## 2014-12-19 DIAGNOSIS — J301 Allergic rhinitis due to pollen: Secondary | ICD-10-CM | POA: Diagnosis not present

## 2014-12-20 DIAGNOSIS — G4733 Obstructive sleep apnea (adult) (pediatric): Secondary | ICD-10-CM | POA: Diagnosis not present

## 2014-12-28 ENCOUNTER — Other Ambulatory Visit: Payer: Self-pay | Admitting: Family Medicine

## 2014-12-28 DIAGNOSIS — J301 Allergic rhinitis due to pollen: Secondary | ICD-10-CM | POA: Diagnosis not present

## 2014-12-28 DIAGNOSIS — J449 Chronic obstructive pulmonary disease, unspecified: Secondary | ICD-10-CM | POA: Diagnosis not present

## 2014-12-28 DIAGNOSIS — G4733 Obstructive sleep apnea (adult) (pediatric): Secondary | ICD-10-CM | POA: Diagnosis not present

## 2014-12-30 ENCOUNTER — Other Ambulatory Visit: Payer: Self-pay | Admitting: Family Medicine

## 2014-12-30 DIAGNOSIS — Z23 Encounter for immunization: Secondary | ICD-10-CM | POA: Diagnosis not present

## 2015-01-01 DIAGNOSIS — J301 Allergic rhinitis due to pollen: Secondary | ICD-10-CM | POA: Diagnosis not present

## 2015-01-15 DIAGNOSIS — J301 Allergic rhinitis due to pollen: Secondary | ICD-10-CM | POA: Diagnosis not present

## 2015-01-17 ENCOUNTER — Ambulatory Visit (INDEPENDENT_AMBULATORY_CARE_PROVIDER_SITE_OTHER): Payer: Medicare Other | Admitting: Family Medicine

## 2015-01-17 ENCOUNTER — Encounter: Payer: Self-pay | Admitting: Family Medicine

## 2015-01-17 VITALS — BP 130/80 | HR 68 | Ht 70.0 in | Wt 182.0 lb

## 2015-01-17 DIAGNOSIS — J301 Allergic rhinitis due to pollen: Secondary | ICD-10-CM

## 2015-01-17 DIAGNOSIS — E7849 Other hyperlipidemia: Secondary | ICD-10-CM

## 2015-01-17 DIAGNOSIS — E784 Other hyperlipidemia: Secondary | ICD-10-CM | POA: Diagnosis not present

## 2015-01-17 DIAGNOSIS — Z23 Encounter for immunization: Secondary | ICD-10-CM | POA: Diagnosis not present

## 2015-01-17 DIAGNOSIS — J4550 Severe persistent asthma, uncomplicated: Secondary | ICD-10-CM | POA: Diagnosis not present

## 2015-01-17 DIAGNOSIS — I10 Essential (primary) hypertension: Secondary | ICD-10-CM

## 2015-01-17 DIAGNOSIS — M5136 Other intervertebral disc degeneration, lumbar region: Secondary | ICD-10-CM

## 2015-01-17 DIAGNOSIS — G4733 Obstructive sleep apnea (adult) (pediatric): Secondary | ICD-10-CM | POA: Diagnosis not present

## 2015-01-17 MED ORDER — LISINOPRIL-HYDROCHLOROTHIAZIDE 10-12.5 MG PO TABS
ORAL_TABLET | ORAL | Status: DC
Start: 2015-01-17 — End: 2015-08-02

## 2015-01-17 MED ORDER — ATORVASTATIN CALCIUM 40 MG PO TABS: 40.0000 mg | ORAL_TABLET | Freq: Every day | ORAL | Status: DC

## 2015-01-17 MED ORDER — ALBUTEROL SULFATE HFA 108 (90 BASE) MCG/ACT IN AERS: 2.0000 | INHALATION_SPRAY | Freq: Four times a day (QID) | RESPIRATORY_TRACT | Status: DC | PRN

## 2015-01-17 MED ORDER — FEXOFENADINE-PSEUDOEPHED ER 180-240 MG PO TB24: 1.0000 | ORAL_TABLET | Freq: Every day | ORAL | Status: DC

## 2015-01-17 MED ORDER — MONTELUKAST SODIUM 10 MG PO TABS: 10.0000 mg | ORAL_TABLET | Freq: Every day | ORAL | Status: DC

## 2015-01-17 MED ORDER — IBUPROFEN 800 MG PO TABS: ORAL_TABLET | ORAL | Status: DC

## 2015-01-17 NOTE — Progress Notes (Signed)
Name: Marcus Delacruz   MRN: [id number]    DOB: 02-20-1941   Date:01/17/2015       Progress Note  Subjective  Chief Complaint  Chief Complaint  Patient presents with  . Allergic Rhinitis   . Hyperlipidemia    change Crestor to Lipitor due to ins  . Asthma  . Hypertension    Hyperlipidemia This is a chronic problem. The current episode started more than 1 year ago. The problem is controlled. Recent lipid tests were reviewed and are normal. He has no history of chronic renal disease, diabetes, hypothyroidism, liver disease, obesity or nephrotic syndrome. There are no known factors aggravating his hyperlipidemia. Pertinent negatives include no chest pain, focal sensory loss, focal weakness, leg pain, myalgias or shortness of breath. Current antihyperlipidemic treatment includes diet change and statins. The current treatment provides mild improvement of lipids. There are no compliance problems.  Risk factors for coronary artery disease include dyslipidemia and hypertension.  Asthma There is no chest tightness, cough, difficulty breathing, frequent throat clearing, hemoptysis, hoarse voice, shortness of breath, sputum production or wheezing. This is a chronic problem. The current episode started more than 1 year ago. The problem occurs intermittently. The problem has been waxing and waning. Pertinent negatives include no appetite change, chest pain, dyspnea on exertion, ear congestion, ear pain, fever, headaches, heartburn, malaise/fatigue, myalgias, nasal congestion, orthopnea, PND, postnasal drip, rhinorrhea, sneezing, sore throat, sweats, trouble swallowing or weight loss. His symptoms are aggravated by nothing. His symptoms are alleviated by beta-agonist and steroid inhaler. He reports moderate improvement on treatment. There are no known risk factors for lung disease. His past medical history is significant for asthma.  Hypertension This is a chronic problem. The current episode started more  than 1 year ago. The problem is unchanged. The problem is controlled. Pertinent negatives include no anxiety, blurred vision, chest pain, headaches, malaise/fatigue, neck pain, orthopnea, palpitations, peripheral edema, PND, shortness of breath or sweats. There are no associated agents to hypertension. There are no known risk factors for coronary artery disease. Past treatments include ACE inhibitors and diuretics. There is no history of angina, kidney disease, CAD/MI, CVA, heart failure, left ventricular hypertrophy, PVD, renovascular disease or retinopathy. There is no history of chronic renal disease or a hypertension causing med.    No problem-specific assessment & plan notes found for this encounter.   Past Medical History  Diagnosis Date  . Hypertension   . Hypercholesteremia   . BPH (benign prostatic hyperplasia)     Past Surgical History  Procedure Laterality Date  . Hernia repair    . Artery repair    . Cholecystectomy    . Back surgery    . Colonoscopy  2013    cleared    Family History  Problem Relation Age of Onset  . COPD Mother   . Asthma Son   . Cancer Father     lung  . Prostate cancer Brother     Social History   Social History  . Marital Status: Married    Spouse Name: N/A  . Number of Children: N/A  . Years of Education: N/A   Occupational History  . Not on file.   Social History Main Topics  . Smoking status: Former Smoker -- 1.00 packs/day for 20 years    Types: Cigarettes    Quit date: 08/09/1983  . Smokeless tobacco: Never Used  . Alcohol Use: 0.0 oz/week    0 Standard drinks or equivalent per week  Comment: once monthly  . Drug Use: No  . Sexual Activity: Not on file   Other Topics Concern  . Not on file   Social History Narrative    Allergies  Allergen Reactions  . Codeine Itching     Review of Systems  Constitutional: Negative for fever, chills, weight loss, malaise/fatigue and appetite change.  HENT: Negative for ear  discharge, ear pain, hoarse voice, postnasal drip, rhinorrhea, sneezing, sore throat and trouble swallowing.   Eyes: Negative for blurred vision.  Respiratory: Negative for cough, hemoptysis, sputum production, shortness of breath and wheezing.   Cardiovascular: Negative for chest pain, dyspnea on exertion, palpitations, orthopnea, leg swelling and PND.  Gastrointestinal: Negative for heartburn, nausea, abdominal pain, diarrhea, constipation, blood in stool and melena.  Genitourinary: Negative for dysuria, urgency, frequency and hematuria.  Musculoskeletal: Positive for back pain. Negative for myalgias, joint pain and neck pain.  Skin: Negative for rash.  Neurological: Negative for dizziness, tingling, sensory change, focal weakness and headaches.  Endo/Heme/Allergies: Negative for environmental allergies and polydipsia. Does not bruise/bleed easily.  Psychiatric/Behavioral: Negative for depression and suicidal ideas. The patient is not nervous/anxious and does not have insomnia.      Objective  Filed Vitals:   01/17/15 0906  BP: 130/80  Pulse: 68  Height: 5\' 10"  (1.778 m)  Weight: 182 lb (82.555 kg)    Physical Exam  Constitutional: He is oriented to person, place, and time and well-developed, well-nourished, and in no distress.  HENT:  Head: Normocephalic.  Right Ear: External ear normal.  Left Ear: External ear normal.  Nose: Nose normal.  Mouth/Throat: Oropharynx is clear and moist.  Eyes: Conjunctivae and EOM are normal. Pupils are equal, round, and reactive to light. Right eye exhibits no discharge. Left eye exhibits no discharge. No scleral icterus.  Neck: Normal range of motion. Neck supple. No JVD present. No tracheal deviation present. No thyromegaly present.  Cardiovascular: Normal rate, regular rhythm, normal heart sounds and intact distal pulses.  Exam reveals no gallop and no friction rub.   No murmur heard. Pulmonary/Chest: Breath sounds normal. No respiratory  distress. He has no wheezes. He has no rales.  Abdominal: Soft. Bowel sounds are normal. He exhibits no mass. There is no hepatosplenomegaly. There is no tenderness. There is no rebound, no guarding and no CVA tenderness.  Musculoskeletal: Normal range of motion. He exhibits no edema or tenderness.  Lymphadenopathy:    He has no cervical adenopathy.  Neurological: He is alert and oriented to person, place, and time. He has normal sensation, normal strength, normal reflexes and intact cranial nerves. No cranial nerve deficit.  Skin: Skin is warm. No rash noted.  Psychiatric: Mood and affect normal.  Nursing note and vitals reviewed.     Assessment & Plan  Problem List Items Addressed This Visit      Cardiovascular and Mediastinum   Essential (primary) hypertension - Primary   Relevant Medications   lisinopril-hydrochlorothiazide (PRINZIDE,ZESTORETIC) 10-12.5 MG tablet   atorvastatin (LIPITOR) 40 MG tablet   Other Relevant Orders   Renal Function Panel     Respiratory   Asthma, chronic   Relevant Medications   Tiotropium Bromide-Olodaterol (STIOLTO RESPIMAT) 2.5-2.5 MCG/ACT AERS   albuterol (PROVENTIL HFA;VENTOLIN HFA) 108 (90 BASE) MCG/ACT inhaler   montelukast (SINGULAIR) 10 MG tablet     Other   Familial multiple lipoprotein-type hyperlipidemia   Relevant Medications   lisinopril-hydrochlorothiazide (PRINZIDE,ZESTORETIC) 10-12.5 MG tablet   atorvastatin (LIPITOR) 40 MG tablet   Other Relevant  Orders   Lipid Profile    Other Visit Diagnoses    Essential hypertension        Relevant Medications    lisinopril-hydrochlorothiazide (PRINZIDE,ZESTORETIC) 10-12.5 MG tablet    atorvastatin (LIPITOR) 40 MG tablet    Degenerative disc disease, lumbar        Relevant Medications    ibuprofen (ADVIL,MOTRIN) 800 MG tablet    Allergic rhinitis due to pollen        Relevant Medications    fexofenadine-pseudoephedrine (ALLEGRA-D 24) 180-240 MG 24 hr tablet    Need for pneumococcal  vaccination        Relevant Orders    Pneumococcal conjugate vaccine 13-valent (Completed)         Dr. [facility]  01/17/2015

## 2015-01-22 DIAGNOSIS — J301 Allergic rhinitis due to pollen: Secondary | ICD-10-CM | POA: Diagnosis not present

## 2015-01-23 DIAGNOSIS — G4733 Obstructive sleep apnea (adult) (pediatric): Secondary | ICD-10-CM | POA: Diagnosis not present

## 2015-01-23 DIAGNOSIS — J301 Allergic rhinitis due to pollen: Secondary | ICD-10-CM | POA: Diagnosis not present

## 2015-01-29 DIAGNOSIS — J301 Allergic rhinitis due to pollen: Secondary | ICD-10-CM | POA: Diagnosis not present

## 2015-02-05 DIAGNOSIS — J301 Allergic rhinitis due to pollen: Secondary | ICD-10-CM | POA: Diagnosis not present

## 2015-02-06 ENCOUNTER — Other Ambulatory Visit: Payer: Self-pay

## 2015-02-07 ENCOUNTER — Other Ambulatory Visit: Payer: Self-pay | Admitting: Family Medicine

## 2015-02-12 DIAGNOSIS — J301 Allergic rhinitis due to pollen: Secondary | ICD-10-CM | POA: Diagnosis not present

## 2015-02-15 DIAGNOSIS — J301 Allergic rhinitis due to pollen: Secondary | ICD-10-CM | POA: Diagnosis not present

## 2015-02-19 DIAGNOSIS — J449 Chronic obstructive pulmonary disease, unspecified: Secondary | ICD-10-CM | POA: Diagnosis not present

## 2015-02-19 DIAGNOSIS — J301 Allergic rhinitis due to pollen: Secondary | ICD-10-CM | POA: Diagnosis not present

## 2015-02-19 DIAGNOSIS — G4733 Obstructive sleep apnea (adult) (pediatric): Secondary | ICD-10-CM | POA: Diagnosis not present

## 2015-02-20 DIAGNOSIS — I6523 Occlusion and stenosis of bilateral carotid arteries: Secondary | ICD-10-CM | POA: Diagnosis not present

## 2015-02-20 DIAGNOSIS — E785 Hyperlipidemia, unspecified: Secondary | ICD-10-CM | POA: Diagnosis not present

## 2015-02-22 DIAGNOSIS — G43B0 Ophthalmoplegic migraine, not intractable: Secondary | ICD-10-CM | POA: Diagnosis not present

## 2015-02-26 DIAGNOSIS — J301 Allergic rhinitis due to pollen: Secondary | ICD-10-CM | POA: Diagnosis not present

## 2015-03-06 DIAGNOSIS — J301 Allergic rhinitis due to pollen: Secondary | ICD-10-CM | POA: Diagnosis not present

## 2015-03-13 DIAGNOSIS — J301 Allergic rhinitis due to pollen: Secondary | ICD-10-CM | POA: Diagnosis not present

## 2015-03-29 DIAGNOSIS — J301 Allergic rhinitis due to pollen: Secondary | ICD-10-CM | POA: Diagnosis not present

## 2015-04-02 DIAGNOSIS — J301 Allergic rhinitis due to pollen: Secondary | ICD-10-CM | POA: Diagnosis not present

## 2015-04-04 DIAGNOSIS — H43811 Vitreous degeneration, right eye: Secondary | ICD-10-CM | POA: Diagnosis not present

## 2015-04-18 DIAGNOSIS — L918 Other hypertrophic disorders of the skin: Secondary | ICD-10-CM | POA: Diagnosis not present

## 2015-04-18 DIAGNOSIS — L570 Actinic keratosis: Secondary | ICD-10-CM | POA: Diagnosis not present

## 2015-04-18 DIAGNOSIS — L821 Other seborrheic keratosis: Secondary | ICD-10-CM | POA: Diagnosis not present

## 2015-05-07 DIAGNOSIS — J301 Allergic rhinitis due to pollen: Secondary | ICD-10-CM | POA: Diagnosis not present

## 2015-05-08 ENCOUNTER — Other Ambulatory Visit: Payer: Self-pay

## 2015-05-09 DIAGNOSIS — G4733 Obstructive sleep apnea (adult) (pediatric): Secondary | ICD-10-CM | POA: Diagnosis not present

## 2015-05-14 DIAGNOSIS — J301 Allergic rhinitis due to pollen: Secondary | ICD-10-CM | POA: Diagnosis not present

## 2015-05-21 DIAGNOSIS — J301 Allergic rhinitis due to pollen: Secondary | ICD-10-CM | POA: Diagnosis not present

## 2015-06-07 DIAGNOSIS — J301 Allergic rhinitis due to pollen: Secondary | ICD-10-CM | POA: Diagnosis not present

## 2015-06-11 DIAGNOSIS — J301 Allergic rhinitis due to pollen: Secondary | ICD-10-CM | POA: Diagnosis not present

## 2015-07-03 DIAGNOSIS — J301 Allergic rhinitis due to pollen: Secondary | ICD-10-CM | POA: Diagnosis not present

## 2015-07-09 DIAGNOSIS — J441 Chronic obstructive pulmonary disease with (acute) exacerbation: Secondary | ICD-10-CM | POA: Diagnosis not present

## 2015-07-11 ENCOUNTER — Encounter: Payer: Self-pay | Admitting: Family Medicine

## 2015-07-16 DIAGNOSIS — J301 Allergic rhinitis due to pollen: Secondary | ICD-10-CM | POA: Diagnosis not present

## 2015-07-30 DIAGNOSIS — J301 Allergic rhinitis due to pollen: Secondary | ICD-10-CM | POA: Diagnosis not present

## 2015-07-31 ENCOUNTER — Encounter: Payer: Medicare Other | Admitting: Family Medicine

## 2015-07-31 DIAGNOSIS — G4733 Obstructive sleep apnea (adult) (pediatric): Secondary | ICD-10-CM | POA: Diagnosis not present

## 2015-07-31 DIAGNOSIS — J449 Chronic obstructive pulmonary disease, unspecified: Secondary | ICD-10-CM | POA: Diagnosis not present

## 2015-07-31 DIAGNOSIS — J301 Allergic rhinitis due to pollen: Secondary | ICD-10-CM | POA: Diagnosis not present

## 2015-08-01 ENCOUNTER — Other Ambulatory Visit: Payer: Self-pay

## 2015-08-01 DIAGNOSIS — H25813 Combined forms of age-related cataract, bilateral: Secondary | ICD-10-CM | POA: Diagnosis not present

## 2015-08-02 ENCOUNTER — Encounter: Payer: Self-pay | Admitting: Family Medicine

## 2015-08-02 ENCOUNTER — Ambulatory Visit (INDEPENDENT_AMBULATORY_CARE_PROVIDER_SITE_OTHER): Payer: Medicare Other | Admitting: Family Medicine

## 2015-08-02 VITALS — BP 120/80 | HR 64 | Ht 70.0 in | Wt 177.0 lb

## 2015-08-02 DIAGNOSIS — E784 Other hyperlipidemia: Secondary | ICD-10-CM

## 2015-08-02 DIAGNOSIS — Z1211 Encounter for screening for malignant neoplasm of colon: Secondary | ICD-10-CM | POA: Diagnosis not present

## 2015-08-02 DIAGNOSIS — J4550 Severe persistent asthma, uncomplicated: Secondary | ICD-10-CM | POA: Diagnosis not present

## 2015-08-02 DIAGNOSIS — I10 Essential (primary) hypertension: Secondary | ICD-10-CM

## 2015-08-02 DIAGNOSIS — E7849 Other hyperlipidemia: Secondary | ICD-10-CM

## 2015-08-02 DIAGNOSIS — Z0000 Encounter for general adult medical examination without abnormal findings: Secondary | ICD-10-CM

## 2015-08-02 LAB — HEMOCCULT GUIAC POC 1CARD (OFFICE): Fecal Occult Blood, POC: NEGATIVE

## 2015-08-02 MED ORDER — MONTELUKAST SODIUM 10 MG PO TABS: 10.0000 mg | ORAL_TABLET | Freq: Every day | ORAL | Status: DC

## 2015-08-02 MED ORDER — ATORVASTATIN CALCIUM 40 MG PO TABS: 40.0000 mg | ORAL_TABLET | Freq: Every day | ORAL | Status: DC

## 2015-08-02 MED ORDER — LISINOPRIL-HYDROCHLOROTHIAZIDE 10-12.5 MG PO TABS
ORAL_TABLET | ORAL | Status: DC
Start: 2015-08-02 — End: 2016-08-18

## 2015-08-02 NOTE — Progress Notes (Signed)
Name: Marcus Delacruz   MRN: [id number]    DOB: [birth year]   Date:08/02/2015       Progress Note  Subjective  Chief Complaint  Chief Complaint  Patient presents with  . Annual Exam  . Allergic Rhinitis   . Hypertension  . Hyperlipidemia    HPI Comments: Patient presents for annual physical exam  Hypertension This is a chronic problem. The current episode started more than 1 year ago. The problem has been waxing and waning since onset. The problem is controlled. Pertinent negatives include no anxiety, blurred vision, chest pain, headaches, malaise/fatigue, neck pain, orthopnea, palpitations, peripheral edema, PND, shortness of breath or sweats. There are no associated agents to hypertension. Risk factors for coronary artery disease include dyslipidemia, male gender and smoking/tobacco exposure. Past treatments include ACE inhibitors and diuretics. The current treatment provides moderate improvement. There are no compliance problems.  There is no history of angina, kidney disease, CAD/MI, CVA, heart failure, left ventricular hypertrophy, PVD, renovascular disease or retinopathy. There is no history of chronic renal disease or a hypertension causing med.  Hyperlipidemia This is a chronic problem. The current episode started more than 1 year ago. The problem is controlled. Recent lipid tests were reviewed and are low. He has no history of chronic renal disease. Pertinent negatives include no chest pain, focal weakness, myalgias or shortness of breath. Current antihyperlipidemic treatment includes statins. The current treatment provides moderate improvement of lipids. There are no compliance problems.  Risk factors for coronary artery disease include dyslipidemia.    No problem-specific assessment & plan notes found for this encounter.   Past Medical History  Diagnosis Date  . Hypertension   . Hypercholesteremia   . BPH (benign prostatic hyperplasia)   . Asthma     Past Surgical  History  Procedure Laterality Date  . Hernia repair    . Artery repair    . Cholecystectomy    . Back surgery    . Colonoscopy  2013    cleared    Family History  Problem Relation Age of Onset  . COPD Mother   . Asthma Son   . Cancer Father     lung  . Prostate cancer Brother     Social History   Social History  . Marital Status: Married    Spouse Name: N/A  . Number of Children: N/A  . Years of Education: N/A   Occupational History  . Not on file.   Social History Main Topics  . Smoking status: Former Smoker -- 1.00 packs/day for 20 years    Types: Cigarettes    Quit date: 08/09/1983  . Smokeless tobacco: Never Used  . Alcohol Use: 0.0 oz/week    0 Standard drinks or equivalent per week     Comment: once monthly  . Drug Use: No  . Sexual Activity: Not on file   Other Topics Concern  . Not on file   Social History Narrative    Allergies  Allergen Reactions  . Codeine Itching     Review of Systems  Constitutional: Negative for fever, chills, weight loss and malaise/fatigue.  HENT: Negative for ear discharge, ear pain and sore throat.   Eyes: Negative for blurred vision.  Respiratory: Negative for cough, sputum production, shortness of breath and wheezing.   Cardiovascular: Negative for chest pain, palpitations, orthopnea, leg swelling and PND.  Gastrointestinal: Negative for heartburn, nausea, abdominal pain, diarrhea, constipation, blood in stool and melena.  Genitourinary: Negative for dysuria, urgency, frequency  and hematuria.  Musculoskeletal: Negative for myalgias, back pain, joint pain and neck pain.  Skin: Negative for rash.  Neurological: Negative for dizziness, tingling, sensory change, focal weakness and headaches.  Endo/Heme/Allergies: Negative for environmental allergies and polydipsia. Does not bruise/bleed easily.  Psychiatric/Behavioral: Negative for depression and suicidal ideas. The patient is not nervous/anxious and does not have  insomnia.      Objective  Filed Vitals:   08/02/15 0945  BP: 120/80  Pulse: 64  Height: 5\' 10"  (1.778 m)  Weight: 177 lb (80.287 kg)    Physical Exam  Constitutional: He is oriented to person, place, and time and well-developed, well-nourished, and in no distress.  HENT:  Head: Normocephalic.  Right Ear: External ear normal.  Left Ear: External ear normal.  Nose: Nose normal.  Mouth/Throat: Oropharynx is clear and moist.  Eyes: Conjunctivae and EOM are normal. Pupils are equal, round, and reactive to light. Right eye exhibits no discharge. Left eye exhibits no discharge. No scleral icterus.  Neck: Normal range of motion. Neck supple. No JVD present. No tracheal deviation present. No thyromegaly present.  Cardiovascular: Normal rate, regular rhythm, S1 normal, S2 normal, normal heart sounds, intact distal pulses and normal pulses.  Exam reveals no gallop, no S3, no S4 and no friction rub.   No murmur heard. Pulmonary/Chest: Breath sounds normal. No respiratory distress. He has no wheezes. He has no rales.  Abdominal: Soft. Bowel sounds are normal. He exhibits no mass. There is no hepatosplenomegaly. There is no tenderness. There is no rebound, no guarding and no CVA tenderness.  Genitourinary: Rectum normal, prostate normal and testes/scrotum normal.  Musculoskeletal: Normal range of motion. He exhibits no edema or tenderness.  Lymphadenopathy:    He has no cervical adenopathy.  Neurological: He is alert and oriented to person, place, and time. He has normal sensation, normal strength, normal reflexes and intact cranial nerves. No cranial nerve deficit.  Skin: Skin is warm. No rash noted.  Psychiatric: Mood and affect normal.  Nursing note and vitals reviewed.     Assessment & Plan  Problem List Items Addressed This Visit      Cardiovascular and Mediastinum   Essential (primary) hypertension   Relevant Medications   lisinopril-hydrochlorothiazide (PRINZIDE,ZESTORETIC)  10-12.5 MG tablet   atorvastatin (LIPITOR) 40 MG tablet   Other Relevant Orders   Renal Function Panel     Respiratory   Asthma, chronic   Relevant Medications   montelukast (SINGULAIR) 10 MG tablet     Other   Familial multiple lipoprotein-type hyperlipidemia   Relevant Medications   lisinopril-hydrochlorothiazide (PRINZIDE,ZESTORETIC) 10-12.5 MG tablet   atorvastatin (LIPITOR) 40 MG tablet   Other Relevant Orders   Lipid Profile    Other Visit Diagnoses    Annual physical exam    -  Primary    Colon cancer screening        Relevant Orders    POCT Occult Blood Stool (Completed)         Dr. Romey Mathieson [facility]  08/02/2015

## 2015-08-03 LAB — RENAL FUNCTION PANEL
Albumin: 4.4 g/dL (ref 3.5–4.8)
BUN/Creatinine Ratio: 13 (ref 10–24)
BUN: 14 mg/dL (ref 8–27)
CALCIUM: 9.3 mg/dL (ref 8.6–10.2)
CO2: 23 mmol/L (ref 18–29)
Chloride: 94 mmol/L — ABNORMAL LOW (ref 96–106)
Creatinine, Ser: 1.04 mg/dL (ref 0.76–1.27)
GFR calc Af Amer: 82 mL/min/{1.73_m2} (ref >59)
GFR calc non Af Amer: 71 mL/min/{1.73_m2} (ref >59)
GLUCOSE: 72 mg/dL (ref 65–99)
PHOSPHORUS: 3.3 mg/dL (ref 2.5–4.5)
Potassium: 4.2 mmol/L (ref 3.5–5.2)
SODIUM: 137 mmol/L (ref 134–144)

## 2015-08-03 LAB — LIPID PANEL
CHOL/HDL RATIO: 2.8 ratio (ref 0.0–5.0)
CHOLESTEROL TOTAL: 203 mg/dL — AB (ref 100–199)
HDL: 72 mg/dL (ref >39)
LDL Calculated: 111 mg/dL — ABNORMAL HIGH (ref 0–99)
TRIGLYCERIDES: 101 mg/dL (ref 0–149)
VLDL Cholesterol Cal: 20 mg/dL (ref 5–40)

## 2015-08-08 IMAGING — CR DG CHEST 2V
2 series · 2 of 2 positions shown · non-contrast
Comparison: None.

CLINICAL DATA: Shortness of breath.

EXAM:
CHEST  2 VIEW

[view not recorded (1 of 2)]
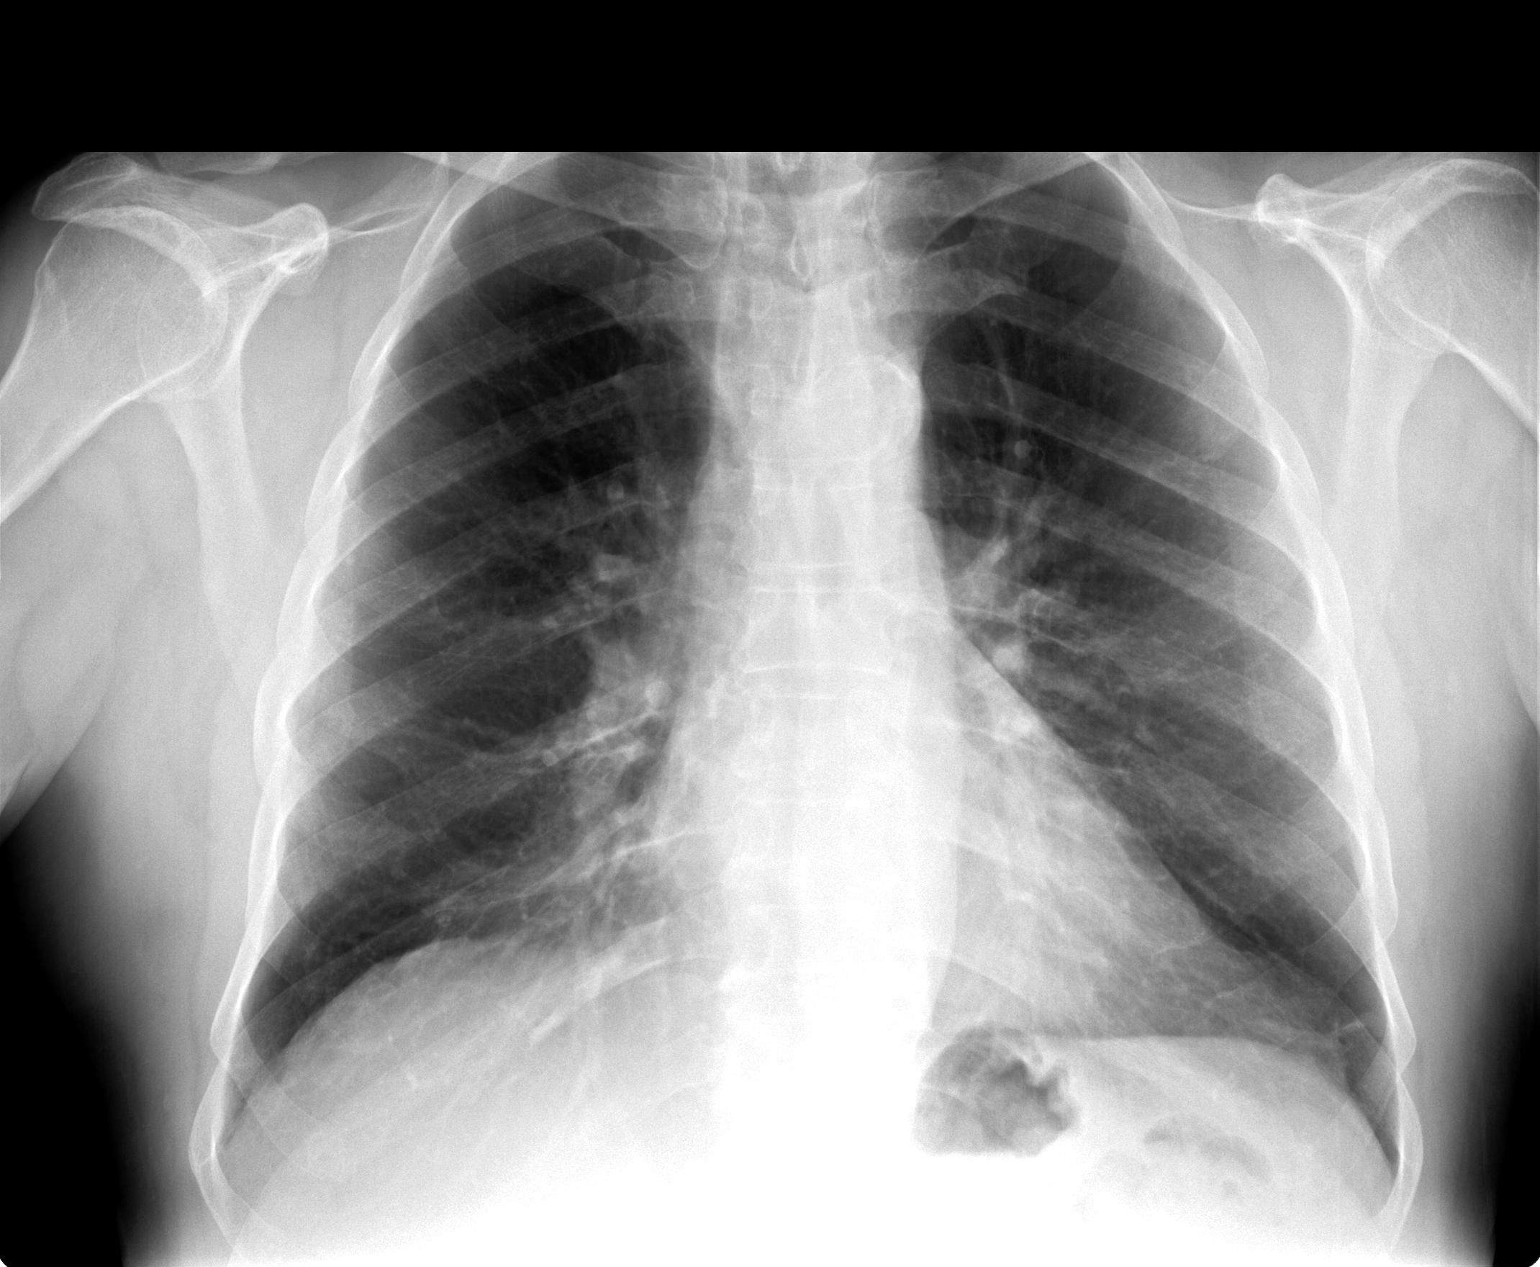

[view not recorded (2 of 2)]
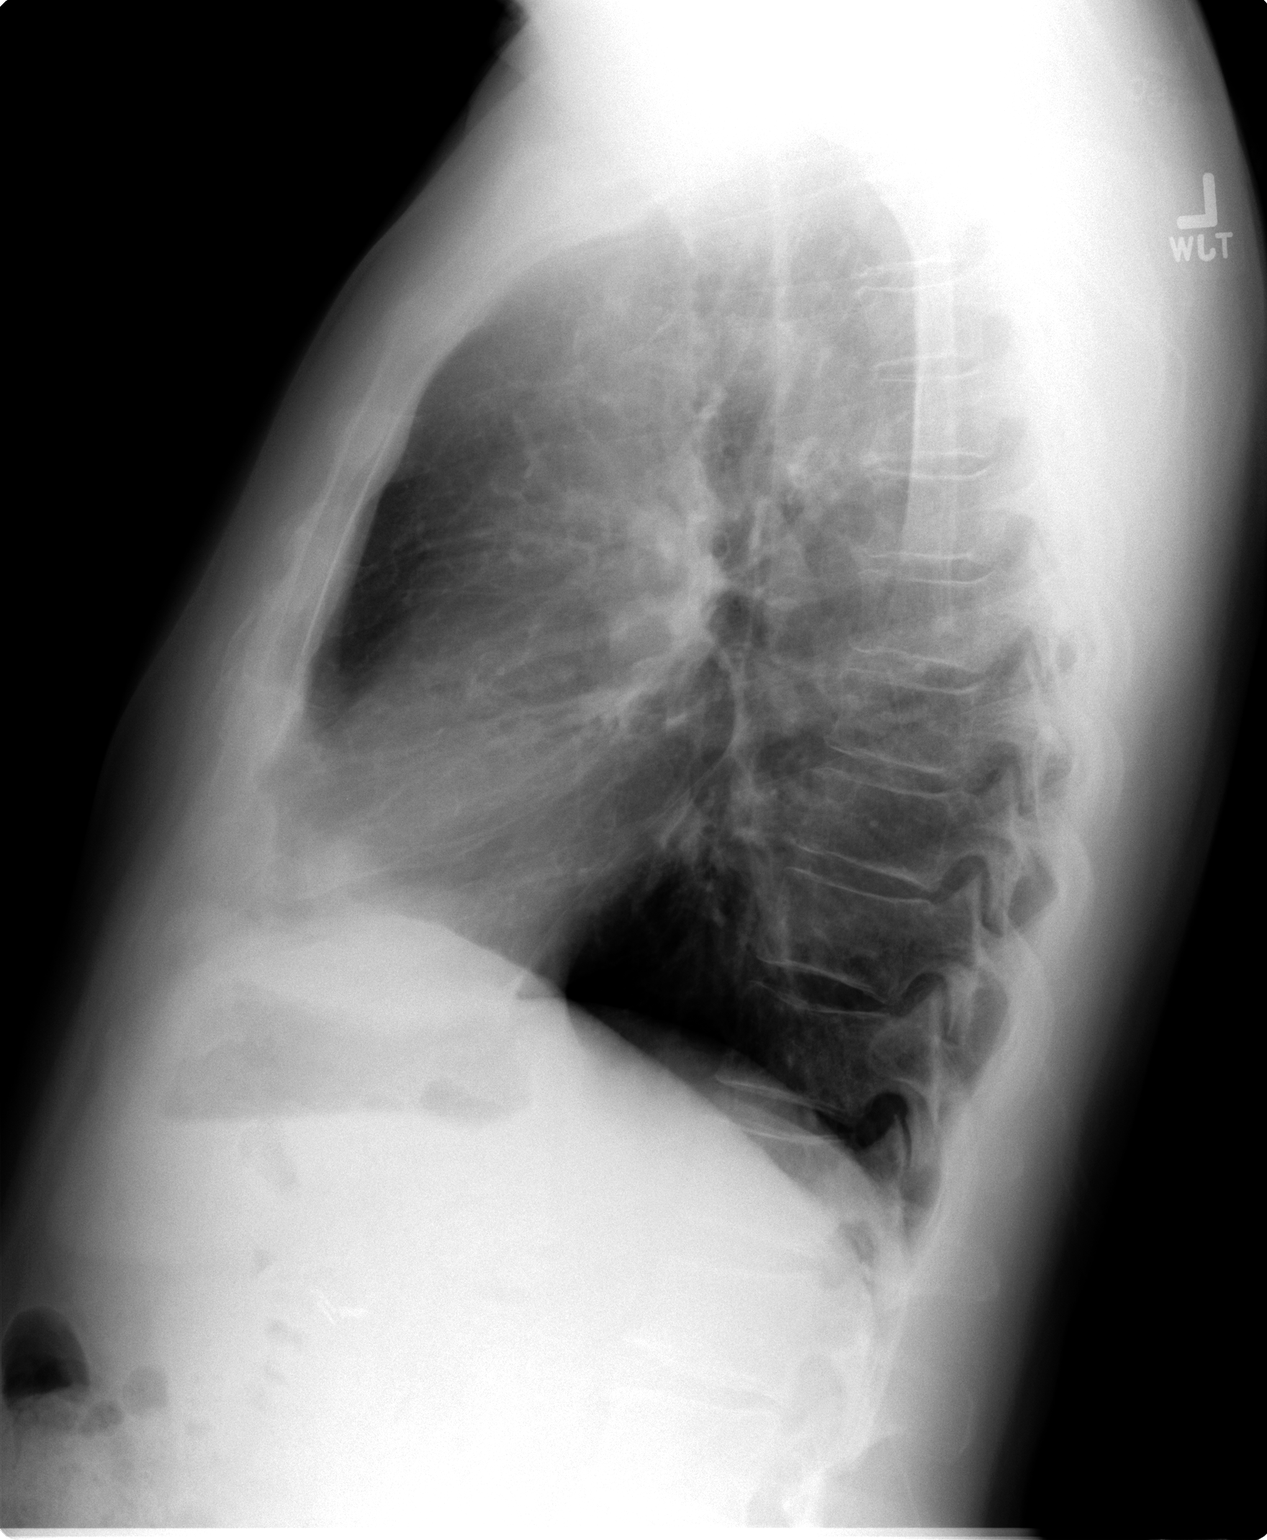

[2 of 2 positions shown; findings below may reference images not displayed]

FINDINGS: The cardiac silhouette, mediastinal and hilar contours are normal.
There is tortuosity and calcification of the thoracic aorta. The
lungs are clear. No pleural effusion. The bony thorax is intact.
IMPRESSION: No acute cardiopulmonary findings.

## 2015-08-09 DIAGNOSIS — J301 Allergic rhinitis due to pollen: Secondary | ICD-10-CM | POA: Diagnosis not present

## 2015-08-13 DIAGNOSIS — J301 Allergic rhinitis due to pollen: Secondary | ICD-10-CM | POA: Diagnosis not present

## 2015-08-27 DIAGNOSIS — J301 Allergic rhinitis due to pollen: Secondary | ICD-10-CM | POA: Diagnosis not present

## 2015-09-03 DIAGNOSIS — J301 Allergic rhinitis due to pollen: Secondary | ICD-10-CM | POA: Diagnosis not present

## 2015-09-24 DIAGNOSIS — J301 Allergic rhinitis due to pollen: Secondary | ICD-10-CM | POA: Diagnosis not present

## 2015-10-01 DIAGNOSIS — J301 Allergic rhinitis due to pollen: Secondary | ICD-10-CM | POA: Diagnosis not present

## 2015-10-02 DIAGNOSIS — I6523 Occlusion and stenosis of bilateral carotid arteries: Secondary | ICD-10-CM | POA: Diagnosis not present

## 2015-10-02 DIAGNOSIS — E785 Hyperlipidemia, unspecified: Secondary | ICD-10-CM | POA: Diagnosis not present

## 2015-10-18 DIAGNOSIS — J301 Allergic rhinitis due to pollen: Secondary | ICD-10-CM | POA: Diagnosis not present

## 2015-10-25 ENCOUNTER — Other Ambulatory Visit: Payer: Self-pay

## 2015-10-25 DIAGNOSIS — N401 Enlarged prostate with lower urinary tract symptoms: Principal | ICD-10-CM

## 2015-10-25 DIAGNOSIS — J301 Allergic rhinitis due to pollen: Secondary | ICD-10-CM | POA: Diagnosis not present

## 2015-10-25 DIAGNOSIS — N138 Other obstructive and reflux uropathy: Secondary | ICD-10-CM

## 2015-10-30 DIAGNOSIS — C44519 Basal cell carcinoma of skin of other part of trunk: Secondary | ICD-10-CM | POA: Diagnosis not present

## 2015-10-30 DIAGNOSIS — L821 Other seborrheic keratosis: Secondary | ICD-10-CM | POA: Diagnosis not present

## 2015-10-30 DIAGNOSIS — L570 Actinic keratosis: Secondary | ICD-10-CM | POA: Diagnosis not present

## 2015-10-30 DIAGNOSIS — D485 Neoplasm of uncertain behavior of skin: Secondary | ICD-10-CM | POA: Diagnosis not present

## 2015-10-30 DIAGNOSIS — Z1283 Encounter for screening for malignant neoplasm of skin: Secondary | ICD-10-CM | POA: Diagnosis not present

## 2015-10-31 ENCOUNTER — Other Ambulatory Visit: Payer: Medicare Other

## 2015-10-31 DIAGNOSIS — N401 Enlarged prostate with lower urinary tract symptoms: Secondary | ICD-10-CM | POA: Diagnosis not present

## 2015-10-31 DIAGNOSIS — G4733 Obstructive sleep apnea (adult) (pediatric): Secondary | ICD-10-CM | POA: Diagnosis not present

## 2015-10-31 DIAGNOSIS — N138 Other obstructive and reflux uropathy: Secondary | ICD-10-CM

## 2015-11-01 DIAGNOSIS — R0602 Shortness of breath: Secondary | ICD-10-CM | POA: Diagnosis not present

## 2015-11-01 DIAGNOSIS — J301 Allergic rhinitis due to pollen: Secondary | ICD-10-CM | POA: Diagnosis not present

## 2015-11-01 DIAGNOSIS — J449 Chronic obstructive pulmonary disease, unspecified: Secondary | ICD-10-CM | POA: Diagnosis not present

## 2015-11-01 DIAGNOSIS — G4733 Obstructive sleep apnea (adult) (pediatric): Secondary | ICD-10-CM | POA: Diagnosis not present

## 2015-11-01 LAB — PSA: PROSTATE SPECIFIC AG, SERUM: 0.5 ng/mL (ref 0.0–4.0)

## 2015-11-05 ENCOUNTER — Other Ambulatory Visit: Payer: Self-pay

## 2015-11-14 DIAGNOSIS — J301 Allergic rhinitis due to pollen: Secondary | ICD-10-CM | POA: Diagnosis not present

## 2015-11-16 ENCOUNTER — Other Ambulatory Visit: Payer: Medicare Other

## 2015-11-19 DIAGNOSIS — J301 Allergic rhinitis due to pollen: Secondary | ICD-10-CM | POA: Diagnosis not present

## 2015-11-21 ENCOUNTER — Encounter: Payer: Self-pay | Admitting: Urology

## 2015-11-21 ENCOUNTER — Ambulatory Visit (INDEPENDENT_AMBULATORY_CARE_PROVIDER_SITE_OTHER): Payer: Medicare Other | Admitting: Urology

## 2015-11-21 VITALS — Ht 70.0 in | Wt 183.5 lb

## 2015-11-21 DIAGNOSIS — Z125 Encounter for screening for malignant neoplasm of prostate: Secondary | ICD-10-CM | POA: Diagnosis not present

## 2015-11-21 DIAGNOSIS — N433 Hydrocele, unspecified: Secondary | ICD-10-CM | POA: Diagnosis not present

## 2015-11-21 DIAGNOSIS — N401 Enlarged prostate with lower urinary tract symptoms: Secondary | ICD-10-CM

## 2015-11-21 DIAGNOSIS — N138 Other obstructive and reflux uropathy: Secondary | ICD-10-CM

## 2015-11-21 MED ORDER — FINASTERIDE 5 MG PO TABS: 5.0000 mg | ORAL_TABLET | Freq: Every day | ORAL | 4 refills | Status: DC

## 2015-11-21 NOTE — Progress Notes (Signed)
11/21/2015 11:15 AM   Marcus Delacruz 28-Nov-1940 [id number]  Referring provider: Juline Patch, MD [address] [place], [place] [address]  Chief Complaint  Patient presents with  . Benign Prostatic Hypertrophy    1 year follow up   . Hydrocele    HPI: Patient is a 75 year old Caucasian male with BPH with LUTS and bilateral hydroceles who presents today for a yearly follow up.  BPH WITH LUTS His IPSS score today is 1, which is mild lower urinary tract symptomatology. He is pleased with his quality life due to his urinary symptoms.  His major complaint today is nocturia x 1, but it is not bothersome to him.  He has had these symptoms for several years.  He denies any dysuria, hematuria or suprapubic pain.  He currently taking finasteride 5 mg daily.  He also denies any recent fevers, chills, nausea or vomiting.   He does not have a family history of PCa.      IPSS    Row Name 11/21/15 1000         International Prostate Symptom Score   How often have you had the sensation of not emptying your bladder? Not at All     How often have you had to urinate less than every two hours? Not at All     How often have you found you stopped and started again several times when you urinated? Not at All     How often have you found it difficult to postpone urination? Not at All     How often have you had a weak urinary stream? Not at All     How often have you had to strain to start urination? Not at All     How many times did you typically get up at night to urinate? 1 Time     Total IPSS Score 1       Quality of Life due to urinary symptoms   If you were to spend the rest of your life with your urinary condition just the way it is now how would you feel about that? Pleased        Score:  1-7 Mild 8-19 Moderate 20-35 Severe  Hydroceles Scrotal ultrasound completed on 10/10/2014 noted small simple bilateral hydroceles.  They have not been bothersome.  He has not had  scrotal pain or increase in scrotal swelling.      PMH: Past Medical History:  Diagnosis Date  . Asthma   . BPH (benign prostatic hyperplasia)   . Hypercholesteremia   . Hypertension     Surgical History: Past Surgical History:  Procedure Laterality Date  . ARTERY REPAIR    . BACK SURGERY    . CHOLECYSTECTOMY    . COLONOSCOPY  2013   cleared  . HERNIA REPAIR      Home Medications:    Medication List       Accurate as of 11/21/15 11:15 AM. Always use your most recent med list.          ADVAIR HFA 230-21 MCG/ACT inhaler Generic drug:  fluticasone-salmeterol   AEROCHAMBER MV inhaler Use as instructed with inhaler.  DX code 493.90   albuterol 108 (90 Base) MCG/ACT inhaler Commonly known as:  PROVENTIL HFA;VENTOLIN HFA Inhale 2 puffs into the lungs every 6 (six) hours as needed for wheezing or shortness of breath.   albuterol (2.5 MG/3ML) 0.083% nebulizer solution Commonly known as:  PROVENTIL USE ONE VIAL  BY NEBULIZER ONCE DAILY AS DIRECTED BY PHYSICIAN.   aspirin 81 MG tablet Take 81 mg by mouth daily.   atorvastatin 40 MG tablet Commonly known as:  LIPITOR Take 1 tablet (40 mg total) by mouth daily.   azithromycin 250 MG tablet Commonly known as:  ZITHROMAX Take 2 tablets (500 mg) on  Day 1,  followed by 1 tablet (250 mg) once daily on Days 2 through 5.   BREO ELLIPTA 100-25 MCG/INH Aepb Generic drug:  fluticasone furoate-vilanterol Inhale 1 puff into the lungs daily.   fexofenadine-pseudoephedrine 180-240 MG 24 hr tablet Commonly known as:  ALLEGRA-D 24 Take 1 tablet by mouth daily. Daily before allergy injections   finasteride 5 MG tablet Commonly known as:  PROSCAR Take 1 tablet (5 mg total) by mouth daily.   ibuprofen 800 MG tablet Commonly known as:  ADVIL,MOTRIN TAKE ONE (1) TABLET THREE (3) TIMES EACH DAY   lisinopril-hydrochlorothiazide 10-12.5 MG tablet Commonly known as:  PRINZIDE,ZESTORETIC TAKE ONE (1) TABLET BY MOUTH ONCE DAILY     montelukast 10 MG tablet Commonly known as:  SINGULAIR Take 1 tablet (10 mg total) by mouth daily.   PRESCRIPTION MEDICATION Patient takes allergy injections       Allergies:  Allergies  Allergen Reactions  . Codeine Itching    Family History: Family History  Problem Relation Age of Onset  . COPD Mother   . Asthma Son   . Cancer Father     lung  . Prostate cancer Neg Hx   . Kidney disease Neg Hx     Social History:  reports that he quit smoking about 32 years ago. His smoking use included Cigarettes. He has a 20.00 pack-year smoking history. He has never used smokeless tobacco. He reports that he drinks alcohol. He reports that he does not use drugs.  ROS: UROLOGY Frequent Urination?: No Hard to postpone urination?: No Burning/pain with urination?: No Get up at night to urinate?: No Leakage of urine?: No Urine stream starts and stops?: No Trouble starting stream?: No Do you have to strain to urinate?: No Blood in urine?: No Urinary tract infection?: No Sexually transmitted disease?: No Injury to kidneys or bladder?: No Painful intercourse?: No Weak stream?: No Erection problems?: No Penile pain?: No  Gastrointestinal Nausea?: No Vomiting?: No Indigestion/heartburn?: No Diarrhea?: No Constipation?: No  Constitutional Fever: No Night sweats?: No Weight loss?: No Fatigue?: No  Skin Skin rash/lesions?: No Itching?: No  Eyes Double vision?: No  Ears/Nose/Throat Sore throat?: No Sinus problems?: No  Hematologic/Lymphatic Swollen glands?: No Easy bruising?: Yes  Cardiovascular Leg swelling?: No Chest pain?: No  Respiratory Cough?: No Shortness of breath?: No  Endocrine Excessive thirst?: No  Musculoskeletal Back pain?: No Joint pain?: No  Neurological Headaches?: No Dizziness?: No  Psychologic Depression?: No Anxiety?: No  Physical Exam: Ht 5\' 10"  (1.778 m)   Wt 183 lb 8 oz (83.2 kg)   BMI 26.33 kg/m   Constitutional:  Well nourished. Alert and oriented, No acute distress. HEENT: [place] AT, moist mucus membranes. Trachea midline, no masses. Cardiovascular: No clubbing, cyanosis, or edema. Respiratory: Normal respiratory effort, no increased work of breathing. GI: Abdomen is soft, non tender, non distended, no abdominal masses. Liver and spleen not palpable.  No hernias appreciated.  Stool sample for occult testing is not indicated.   GU: No CVA tenderness.  No bladder fullness or masses.  Patient with circumcised phallus.   Urethral meatus is patent.  No penile discharge. No penile lesions  or rashes. Scrotum without lesions, cysts, rashes and/or edema.  Testicles are located scrotally bilaterally. No masses are appreciated in the testicles. Left and right epididymis are normal. Rectal: Patient with  normal sphincter tone. Anus and perineum without scarring or rashes. No rectal masses are appreciated. Prostate is approximately 50 grams, no nodules are appreciated. Seminal vesicles are normal. Skin: No rashes, bruises or suspicious lesions. Lymph: No cervical or inguinal adenopathy. Neurologic: Grossly intact, no focal deficits, moving all 4 extremities. Psychiatric: Normal mood and affect.  Laboratory Data: Lab Results  Component Value Date   WBC 8.2 08/08/2013   HGB 15.7 07/10/2014   HCT 43.3 08/08/2013   MCV 86.7 08/08/2013   PLT 354.0 08/08/2013    Lab Results  Component Value Date   CREATININE 1.04 08/02/2015   PSA  0.5 ng/mL on 10/31/2015  Pertinent Imaging: CLINICAL DATA: 75 year old male with right testicle swelling. Initial encounter.  EXAM: SCROTAL ULTRASOUND  DOPPLER ULTRASOUND OF THE TESTICLES  TECHNIQUE: Complete ultrasound examination of the testicles, epididymis, and other scrotal structures was performed. Color and spectral Doppler ultrasound were also utilized to evaluate blood flow to the testicles.  COMPARISON: None.  FINDINGS: Right testicle  Measurements: 4.0  x 1.6 x 2.5 cm. No mass or microlithiasis visualized.  Left testicle  Measurements: 3.5 x 1.5 x 2.6 cm. No mass or microlithiasis visualized.  Right epididymis: Within normal limits in size and appearance.  Left epididymis: Within normal limits in size and appearance.  Hydrocele: Small and relatively symmetric simple appearing bilateral hydroceles  Varicocele: None visualized.  Pulsed Doppler interrogation of both testes demonstrates normal low resistance arterial and venous waveforms bilaterally.  IMPRESSION: No evidence of testicular torsion and negative scrotal ultrasound aside from small simple appearing bilateral hydroceles.   Electronically Signed  By: [name] M.D.  On: 10/10/2014 15:30  Assessment & Plan:    1. 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    3. PSA screening  - AUA Guideline's (2013): The panel does not recommend routine PSA screening in men age 78+ years or any man with less than a 10 to 23 year life expectancy.  If the individual is in excellent health and after discussion it is decided to do a screening PSA, the threshold for biopsy should be raised to 10 ng/mL and if the PSA returns below 3 ng/mL, discontinue screening.  I discussed abnormal findings with patient and possible implications, including possible prostate cancer. He understands that at his age that the risk of the patient dying from low-grade prostate cancer at this point is also very low. We typically stop screening between the ages of 81 and 24 for this reason. At his age, the treatment for prostate cancer is palliative most often and is not initiated until the patient becomes symptomatic including worsening voiding symptoms or  bone pain. Patient participated in shared decision-making today. Declined any further screening for cancer with a PSA level or biopsy.  Return in about 1 year (around 11/20/2016) for IPSS and exam.  Zara Council, [facility] [address] [place], [place] [address] [phone]

## 2015-11-26 DIAGNOSIS — J301 Allergic rhinitis due to pollen: Secondary | ICD-10-CM | POA: Diagnosis not present

## 2015-12-03 DIAGNOSIS — J301 Allergic rhinitis due to pollen: Secondary | ICD-10-CM | POA: Diagnosis not present

## 2015-12-03 DIAGNOSIS — C44519 Basal cell carcinoma of skin of other part of trunk: Secondary | ICD-10-CM | POA: Diagnosis not present

## 2015-12-05 DIAGNOSIS — R0602 Shortness of breath: Secondary | ICD-10-CM | POA: Diagnosis not present

## 2015-12-05 LAB — PULMONARY FUNCTION TEST

## 2015-12-06 DIAGNOSIS — J449 Chronic obstructive pulmonary disease, unspecified: Secondary | ICD-10-CM | POA: Diagnosis not present

## 2015-12-06 DIAGNOSIS — G4733 Obstructive sleep apnea (adult) (pediatric): Secondary | ICD-10-CM | POA: Diagnosis not present

## 2015-12-06 DIAGNOSIS — R0602 Shortness of breath: Secondary | ICD-10-CM | POA: Diagnosis not present

## 2015-12-17 DIAGNOSIS — J301 Allergic rhinitis due to pollen: Secondary | ICD-10-CM | POA: Diagnosis not present

## 2015-12-24 DIAGNOSIS — J301 Allergic rhinitis due to pollen: Secondary | ICD-10-CM | POA: Diagnosis not present

## 2016-01-14 DIAGNOSIS — J301 Allergic rhinitis due to pollen: Secondary | ICD-10-CM | POA: Diagnosis not present

## 2016-01-15 DIAGNOSIS — Z23 Encounter for immunization: Secondary | ICD-10-CM | POA: Diagnosis not present

## 2016-01-22 DIAGNOSIS — J301 Allergic rhinitis due to pollen: Secondary | ICD-10-CM | POA: Diagnosis not present

## 2016-01-23 DIAGNOSIS — J4530 Mild persistent asthma, uncomplicated: Secondary | ICD-10-CM | POA: Diagnosis not present

## 2016-01-23 DIAGNOSIS — J301 Allergic rhinitis due to pollen: Secondary | ICD-10-CM | POA: Diagnosis not present

## 2016-01-28 DIAGNOSIS — J301 Allergic rhinitis due to pollen: Secondary | ICD-10-CM | POA: Diagnosis not present

## 2016-02-04 ENCOUNTER — Other Ambulatory Visit: Payer: Self-pay

## 2016-02-04 DIAGNOSIS — J301 Allergic rhinitis due to pollen: Secondary | ICD-10-CM | POA: Diagnosis not present

## 2016-02-07 DIAGNOSIS — J301 Allergic rhinitis due to pollen: Secondary | ICD-10-CM | POA: Diagnosis not present

## 2016-02-12 DIAGNOSIS — J301 Allergic rhinitis due to pollen: Secondary | ICD-10-CM | POA: Diagnosis not present

## 2016-02-19 DIAGNOSIS — J301 Allergic rhinitis due to pollen: Secondary | ICD-10-CM | POA: Diagnosis not present

## 2016-02-26 DIAGNOSIS — J301 Allergic rhinitis due to pollen: Secondary | ICD-10-CM | POA: Diagnosis not present

## 2016-03-04 DIAGNOSIS — J301 Allergic rhinitis due to pollen: Secondary | ICD-10-CM | POA: Diagnosis not present

## 2016-03-11 DIAGNOSIS — J301 Allergic rhinitis due to pollen: Secondary | ICD-10-CM | POA: Diagnosis not present

## 2016-03-20 DIAGNOSIS — J301 Allergic rhinitis due to pollen: Secondary | ICD-10-CM | POA: Diagnosis not present

## 2016-03-25 DIAGNOSIS — J301 Allergic rhinitis due to pollen: Secondary | ICD-10-CM | POA: Diagnosis not present

## 2016-04-01 ENCOUNTER — Other Ambulatory Visit: Payer: Self-pay

## 2016-04-01 DIAGNOSIS — J301 Allergic rhinitis due to pollen: Secondary | ICD-10-CM | POA: Diagnosis not present

## 2016-04-02 ENCOUNTER — Other Ambulatory Visit (INDEPENDENT_AMBULATORY_CARE_PROVIDER_SITE_OTHER): Payer: Self-pay | Admitting: Vascular Surgery

## 2016-04-02 DIAGNOSIS — I6523 Occlusion and stenosis of bilateral carotid arteries: Secondary | ICD-10-CM

## 2016-04-03 ENCOUNTER — Encounter (INDEPENDENT_AMBULATORY_CARE_PROVIDER_SITE_OTHER): Payer: Self-pay | Admitting: Vascular Surgery

## 2016-04-03 ENCOUNTER — Ambulatory Visit (INDEPENDENT_AMBULATORY_CARE_PROVIDER_SITE_OTHER): Payer: Medicare PPO

## 2016-04-03 ENCOUNTER — Ambulatory Visit (INDEPENDENT_AMBULATORY_CARE_PROVIDER_SITE_OTHER): Payer: Medicare PPO | Admitting: Vascular Surgery

## 2016-04-03 VITALS — BP 156/72 | HR 62 | Resp 16 | Ht 70.0 in | Wt 185.0 lb

## 2016-04-03 DIAGNOSIS — I6523 Occlusion and stenosis of bilateral carotid arteries: Secondary | ICD-10-CM

## 2016-04-03 DIAGNOSIS — E784 Other hyperlipidemia: Secondary | ICD-10-CM | POA: Diagnosis not present

## 2016-04-03 DIAGNOSIS — I10 Essential (primary) hypertension: Secondary | ICD-10-CM

## 2016-04-03 DIAGNOSIS — E7849 Other hyperlipidemia: Secondary | ICD-10-CM

## 2016-04-03 LAB — VAS US CAROTID
LCCADSYS: 42 cm/s
LEFT ECA DIAS: -14 cm/s
LEFT VERTEBRAL DIAS: 10 cm/s
LICAPDIAS: -36 cm/s
Left CCA dist dias: 20 cm/s
Left CCA prox dias: 14 cm/s
Left CCA prox sys: 104 cm/s
Left ICA dist dias: -21 cm/s
Left ICA dist sys: -106 cm/s
Left ICA prox sys: -143 cm/s
RCCADSYS: -73 cm/s
RCCAPDIAS: 15 cm/s
RCCAPSYS: 90 cm/s
RIGHT CCA MID DIAS: 18 cm/s
RIGHT VERTEBRAL DIAS: 12 cm/s

## 2016-04-03 NOTE — Progress Notes (Signed)
Subjective:    Patient ID: Marcus Delacruz, male    DOB: [birth year], 76 y.o.   MRN: [id number] Chief Complaint  Patient presents with  . Follow-up   Patient presents for a six month non-invasive study follow up for carotid stenosis. The stenosis has been followed by surveillance duplexes. The patient underwent a bilateral carotid duplex scan which showed no change from the previous exam on 10/02/15. Duplex is stable with a patent right CEA stenosis and Left ICA stenosis (40-59%). The patient denies experiencing Amaurosis Fugax, TIA like symptoms or focal motor deficits.    Review of Systems  Constitutional: Negative.   HENT: Negative.   Eyes: Negative.   Respiratory: Negative.   Cardiovascular: Negative.   Gastrointestinal: Negative.   Endocrine: Negative.   Genitourinary: Negative.   Musculoskeletal: Negative.   Skin: Negative.   Allergic/Immunologic: Negative.   Neurological: Negative.   Hematological: Negative.   Psychiatric/Behavioral: Negative.       Objective:   Physical Exam  Constitutional: He is oriented to person, place, and time. He appears well-developed and well-nourished.  HENT:  Head: Normocephalic and atraumatic.  Right Ear: External ear normal.  Left Ear: External ear normal.  Eyes: Conjunctivae and EOM are normal. Pupils are equal, round, and reactive to light.  Neck: Normal range of motion.  Cardiovascular: Normal rate, regular rhythm, normal heart sounds and intact distal pulses.   Pulses:      Radial pulses are 2+ on the right side, and 2+ on the left side.       Dorsalis pedis pulses are 2+ on the right side, and 2+ on the left side.       Posterior tibial pulses are 2+ on the right side, and 2+ on the left side.  Left Carotid Bruit Noted  Pulmonary/Chest: Effort normal and breath sounds normal.  Abdominal: Soft. Bowel sounds are normal.  Musculoskeletal: Normal range of motion. He exhibits no edema.  Neurological: He is alert and oriented to  person, place, and time.  Skin: Skin is warm and dry.  Right CEA healed.   Psychiatric: He has a normal mood and affect. His behavior is normal. Judgment and thought content normal.   BP (!) 156/72 (BP Location: Left Arm)   Pulse 62   Resp 16   Ht 5\' 10"  (1.778 m)   Wt 185 lb (83.9 kg)   BMI 26.54 kg/m   Past Medical History:  Diagnosis Date  . Asthma   . BPH (benign prostatic hyperplasia)   . Hypercholesteremia   . Hypertension    Social History   Social History  . Marital status: Married    Spouse name: N/A  . Number of children: N/A  . Years of education: N/A   Occupational History  . Not on file.   Social History Main Topics  . Smoking status: Former Smoker    Packs/day: 1.00    Years: 20.00    Types: Cigarettes    Quit date: 08/09/1983  . Smokeless tobacco: Never Used  . Alcohol use 0.0 oz/week     Comment: once monthly  . Drug use: No  . Sexual activity: Not on file   Other Topics Concern  . Not on file   Social History Narrative  . No narrative on file   Past Surgical History:  Procedure Laterality Date  . ARTERY REPAIR    . BACK SURGERY    . CHOLECYSTECTOMY    . COLONOSCOPY  2013   cleared  .  HERNIA REPAIR     Family History  Problem Relation Age of Onset  . COPD Mother   . Asthma Son   . Cancer Father     lung  . Prostate cancer Neg Hx   . Kidney disease Neg Hx     Allergies  Allergen Reactions  . Codeine Itching      Assessment & Plan:  Patient presents for a six month non-invasive study follow up for carotid stenosis. The stenosis has been followed by surveillance duplexes. The patient underwent a bilateral carotid duplex scan which showed no change from the previous exam on 10/02/15. Duplex is stable with a patent right CEA stenosis and Left ICA stenosis (40-59%). The patient denies experiencing Amaurosis Fugax, TIA like symptoms or focal motor deficits.   1. Bilateral carotid artery stenosis - Stable Studies reviewed with  patient. Patient asymptomatic with stable duplex.  No intervention at this time.  Patient to return in six months for surveillance carotid duplex. Patient to continue medical optimization with ASA and dyslipidemia medication. Patient to remain abstinent of tobacco use. I have discussed with the patient at length the risk factors for and pathogenesis of atherosclerotic disease and encouraged a healthy diet, regular exercise regimen and blood pressure / glucose control.  Patient was instructed to contact our office in the interim with problems such as arm / leg weakness or numbness, speech / swallowing difficulty or temporary monocular blindness. The patient expresses their understanding.   - VAS US CAROTID; Future  2. Familial multiple lipoprotein-type hyperlipidemia - Stable Encouraged good control as its slows the progression of atherosclerotic disease  3. Essential (primary) hypertension - Stable Encouraged good control as its slows the progression of atherosclerotic disease  Current Outpatient Prescriptions on File Prior to Visit  Medication Sig Dispense Refill  . albuterol (PROVENTIL HFA;VENTOLIN HFA) 108 (90 BASE) MCG/ACT inhaler Inhale 2 puffs into the lungs every 6 (six) hours as needed for wheezing or shortness of breath. 1 Inhaler 11  . albuterol (PROVENTIL) (2.5 MG/3ML) 0.083% nebulizer solution USE ONE VIAL BY NEBULIZER ONCE DAILY AS DIRECTED BY PHYSICIAN. 75 mL 1  . aspirin 81 MG tablet Take 81 mg by mouth daily.    Marland Kitchen atorvastatin (LIPITOR) 40 MG tablet Take 1 tablet (40 mg total) by mouth daily. 90 tablet 1  . fexofenadine-pseudoephedrine (ALLEGRA-D 24) 180-240 MG 24 hr tablet Take 1 tablet by mouth daily. Daily before allergy injections 90 tablet 1  . finasteride (PROSCAR) 5 MG tablet Take 1 tablet (5 mg total) by mouth daily. 90 tablet 4  . fluticasone furoate-vilanterol (BREO ELLIPTA) 100-25 MCG/INH AEPB Inhale 1 puff into the lungs daily.    Marland Kitchen ibuprofen (ADVIL,MOTRIN) 800  MG tablet TAKE ONE (1) TABLET THREE (3) TIMES EACH DAY 270 tablet 1  . lisinopril-hydrochlorothiazide (PRINZIDE,ZESTORETIC) 10-12.5 MG tablet TAKE ONE (1) TABLET BY MOUTH ONCE DAILY 90 tablet 1  . montelukast (SINGULAIR) 10 MG tablet Take 1 tablet (10 mg total) by mouth daily. 90 tablet 1  . PRESCRIPTION MEDICATION Patient takes allergy injections    . Spacer/Aero-Holding Chambers (AEROCHAMBER MV) inhaler Use as instructed with inhaler.  DX code 493.90 1 each 0  . azithromycin (ZITHROMAX) 250 MG tablet Take 2 tablets (500 mg) on  Day 1,  followed by 1 tablet (250 mg) once daily on Days 2 through 5.    . fluticasone-salmeterol (ADVAIR HFA) 230-21 MCG/ACT inhaler      No current facility-administered medications on file prior to visit.  There are no Patient Instructions on file for this visit. No Follow-up on file.   Lizzett Nobile A Judee Hennick, PA-C

## 2016-04-07 DIAGNOSIS — J301 Allergic rhinitis due to pollen: Secondary | ICD-10-CM | POA: Diagnosis not present

## 2016-04-22 DIAGNOSIS — J301 Allergic rhinitis due to pollen: Secondary | ICD-10-CM | POA: Diagnosis not present

## 2016-04-28 DIAGNOSIS — J301 Allergic rhinitis due to pollen: Secondary | ICD-10-CM | POA: Diagnosis not present

## 2016-04-30 DIAGNOSIS — G4733 Obstructive sleep apnea (adult) (pediatric): Secondary | ICD-10-CM | POA: Diagnosis not present

## 2016-05-06 ENCOUNTER — Other Ambulatory Visit: Payer: Self-pay

## 2016-05-22 DIAGNOSIS — J440 Chronic obstructive pulmonary disease with acute lower respiratory infection: Secondary | ICD-10-CM | POA: Diagnosis not present

## 2016-05-22 DIAGNOSIS — G4733 Obstructive sleep apnea (adult) (pediatric): Secondary | ICD-10-CM | POA: Diagnosis not present

## 2016-05-22 DIAGNOSIS — J449 Chronic obstructive pulmonary disease, unspecified: Secondary | ICD-10-CM | POA: Diagnosis not present

## 2016-05-22 DIAGNOSIS — R0602 Shortness of breath: Secondary | ICD-10-CM | POA: Diagnosis not present

## 2016-05-23 ENCOUNTER — Other Ambulatory Visit: Payer: Self-pay

## 2016-05-26 DIAGNOSIS — J301 Allergic rhinitis due to pollen: Secondary | ICD-10-CM | POA: Diagnosis not present

## 2016-06-02 DIAGNOSIS — L570 Actinic keratosis: Secondary | ICD-10-CM | POA: Diagnosis not present

## 2016-06-02 DIAGNOSIS — N6082 Other benign mammary dysplasias of left breast: Secondary | ICD-10-CM | POA: Diagnosis not present

## 2016-06-02 DIAGNOSIS — Z85828 Personal history of other malignant neoplasm of skin: Secondary | ICD-10-CM | POA: Diagnosis not present

## 2016-06-03 DIAGNOSIS — J301 Allergic rhinitis due to pollen: Secondary | ICD-10-CM | POA: Diagnosis not present

## 2016-06-23 DIAGNOSIS — J301 Allergic rhinitis due to pollen: Secondary | ICD-10-CM | POA: Diagnosis not present

## 2016-06-30 DIAGNOSIS — J301 Allergic rhinitis due to pollen: Secondary | ICD-10-CM | POA: Diagnosis not present

## 2016-07-02 DIAGNOSIS — G4733 Obstructive sleep apnea (adult) (pediatric): Secondary | ICD-10-CM | POA: Diagnosis not present

## 2016-07-21 DIAGNOSIS — J301 Allergic rhinitis due to pollen: Secondary | ICD-10-CM | POA: Diagnosis not present

## 2016-07-28 DIAGNOSIS — J301 Allergic rhinitis due to pollen: Secondary | ICD-10-CM | POA: Diagnosis not present

## 2016-07-30 DIAGNOSIS — G4733 Obstructive sleep apnea (adult) (pediatric): Secondary | ICD-10-CM | POA: Diagnosis not present

## 2016-08-11 ENCOUNTER — Encounter: Payer: Medicare Other | Admitting: Family Medicine

## 2016-08-18 ENCOUNTER — Other Ambulatory Visit: Payer: Self-pay

## 2016-08-18 ENCOUNTER — Ambulatory Visit (INDEPENDENT_AMBULATORY_CARE_PROVIDER_SITE_OTHER): Payer: Medicare PPO | Admitting: Family Medicine

## 2016-08-18 ENCOUNTER — Encounter: Payer: Self-pay | Admitting: Family Medicine

## 2016-08-18 VITALS — BP 130/64 | HR 80 | Ht 70.0 in | Wt 183.0 lb

## 2016-08-18 DIAGNOSIS — I10 Essential (primary) hypertension: Secondary | ICD-10-CM | POA: Diagnosis not present

## 2016-08-18 DIAGNOSIS — J4550 Severe persistent asthma, uncomplicated: Secondary | ICD-10-CM | POA: Diagnosis not present

## 2016-08-18 DIAGNOSIS — E784 Other hyperlipidemia: Secondary | ICD-10-CM | POA: Diagnosis not present

## 2016-08-18 DIAGNOSIS — J301 Allergic rhinitis due to pollen: Secondary | ICD-10-CM

## 2016-08-18 DIAGNOSIS — R740 Nonspecific elevation of levels of transaminase and lactic acid dehydrogenase [LDH]: Secondary | ICD-10-CM

## 2016-08-18 DIAGNOSIS — H2513 Age-related nuclear cataract, bilateral: Secondary | ICD-10-CM | POA: Diagnosis not present

## 2016-08-18 DIAGNOSIS — E7849 Other hyperlipidemia: Secondary | ICD-10-CM

## 2016-08-18 DIAGNOSIS — Z0000 Encounter for general adult medical examination without abnormal findings: Secondary | ICD-10-CM

## 2016-08-18 DIAGNOSIS — Z1211 Encounter for screening for malignant neoplasm of colon: Secondary | ICD-10-CM

## 2016-08-18 DIAGNOSIS — R7401 Elevation of levels of liver transaminase levels: Secondary | ICD-10-CM

## 2016-08-18 LAB — HEMOCCULT GUIAC POC 1CARD (OFFICE): FECAL OCCULT BLD: NEGATIVE

## 2016-08-18 MED ORDER — LISINOPRIL-HYDROCHLOROTHIAZIDE 10-12.5 MG PO TABS: ORAL_TABLET | ORAL | 1 refills | Status: DC

## 2016-08-18 MED ORDER — FLUTICASONE FUROATE-VILANTEROL 100-25 MCG/INH IN AEPB: 3.0000 | INHALATION_SPRAY | Freq: Every day | RESPIRATORY_TRACT | 3 refills | Status: DC

## 2016-08-18 MED ORDER — FEXOFENADINE HCL 180 MG PO TABS: 180.0000 mg | ORAL_TABLET | Freq: Every day | ORAL | 3 refills | Status: DC

## 2016-08-18 MED ORDER — ALBUTEROL SULFATE HFA 108 (90 BASE) MCG/ACT IN AERS: 2.0000 | INHALATION_SPRAY | Freq: Four times a day (QID) | RESPIRATORY_TRACT | 11 refills | Status: DC | PRN

## 2016-08-18 MED ORDER — MONTELUKAST SODIUM 10 MG PO TABS: 10.0000 mg | ORAL_TABLET | Freq: Every day | ORAL | 1 refills | Status: DC

## 2016-08-18 MED ORDER — ATORVASTATIN CALCIUM 40 MG PO TABS: 40.0000 mg | ORAL_TABLET | Freq: Every day | ORAL | 1 refills | Status: DC

## 2016-08-18 NOTE — Progress Notes (Signed)
Name: Marcus Delacruz   MRN: [id number]    DOB: 17-Jan-1941   Date:08/18/2016       Progress Note  Subjective  Chief Complaint  Chief Complaint  Patient presents with  . Allergic Rhinitis   . Asthma  . Hypertension  . Hyperlipidemia  . Annual Exam    Patient presents for annual physical exam.   Asthma  There is no chest tightness, cough, difficulty breathing, frequent throat clearing, hemoptysis, hoarse voice, shortness of breath, sputum production or wheezing. This is a chronic problem. The current episode started more than 1 year ago. The problem occurs daily. The problem has been gradually improving. Associated symptoms include rhinorrhea and sneezing. Pertinent negatives include no chest pain, dyspnea on exertion, ear pain, fever, headaches, heartburn, malaise/fatigue, myalgias, nasal congestion, PND, postnasal drip, sore throat, sweats or weight loss. His symptoms are aggravated by change in weather, pollen and URI. His symptoms are alleviated by beta-agonist, leukotriene antagonist and steroid inhaler. His past medical history is significant for asthma and bronchitis. There is no history of bronchiectasis or COPD.  Hypertension  This is a chronic problem. The current episode started more than 1 year ago. The problem has been gradually improving since onset. The problem is controlled. Pertinent negatives include no anxiety, blurred vision, chest pain, headaches, malaise/fatigue, neck pain, orthopnea, palpitations, peripheral edema, PND, shortness of breath or sweats. There are no associated agents to hypertension. There are no known risk factors for coronary artery disease. Past treatments include ACE inhibitors and diuretics. The current treatment provides moderate improvement. There are no compliance problems.  There is no history of angina, kidney disease, CAD/MI, CVA, heart failure, left ventricular hypertrophy, PVD or retinopathy. There is no history of chronic renal disease, a  hypertension causing med or renovascular disease.  Hyperlipidemia  This is a chronic problem. The problem is controlled. Recent lipid tests were reviewed and are normal. He has no history of chronic renal disease or diabetes. Pertinent negatives include no chest pain, focal weakness, myalgias or shortness of breath. Current antihyperlipidemic treatment includes statins. The current treatment provides moderate improvement of lipids. There are no compliance problems.     No problem-specific Assessment & Plan notes found for this encounter.   Past Medical History:  Diagnosis Date  . Asthma   . BPH (benign prostatic hyperplasia)   . Hypercholesteremia   . Hypertension     Past Surgical History:  Procedure Laterality Date  . ARTERY REPAIR    . BACK SURGERY    . CHOLECYSTECTOMY    . COLONOSCOPY  2013   cleared  . HERNIA REPAIR      Family History  Problem Relation Age of Onset  . COPD Mother   . Asthma Son   . Cancer Father        lung  . Prostate cancer Neg Hx   . Kidney disease Neg Hx     Social History   Social History  . Marital status: Married    Spouse name: N/A  . Number of children: N/A  . Years of education: N/A   Occupational History  . Not on file.   Social History Main Topics  . Smoking status: Former Smoker    Packs/day: 1.00    Years: 20.00    Types: Cigarettes    Quit date: 08/09/1983  . Smokeless tobacco: Never Used  . Alcohol use 0.0 oz/week     Comment: once monthly  . Drug use: No  . Sexual activity: Not  on file   Other Topics Concern  . Not on file   Social History Narrative  . No narrative on file    Allergies  Allergen Reactions  . Codeine Itching    Outpatient Medications Prior to Visit  Medication Sig Dispense Refill  . albuterol (PROVENTIL) (2.5 MG/3ML) 0.083% nebulizer solution USE ONE VIAL BY NEBULIZER ONCE DAILY AS DIRECTED BY PHYSICIAN. 75 mL 1  . aspirin 81 MG tablet Take 81 mg by mouth daily.    . finasteride (PROSCAR)  5 MG tablet Take 1 tablet (5 mg total) by mouth daily. (Patient taking differently: Take 5 mg by mouth daily. Shannon) 90 tablet 4  . PRESCRIPTION MEDICATION Patient takes allergy injections    . Spacer/Aero-Holding Chambers (AEROCHAMBER MV) inhaler Use as instructed with inhaler.  DX code 493.90 1 each 0  . albuterol (PROVENTIL HFA;VENTOLIN HFA) 108 (90 BASE) MCG/ACT inhaler Inhale 2 puffs into the lungs every 6 (six) hours as needed for wheezing or shortness of breath. 1 Inhaler 11  . atorvastatin (LIPITOR) 40 MG tablet Take 1 tablet (40 mg total) by mouth daily. 90 tablet 1  . fexofenadine-pseudoephedrine (ALLEGRA-D 24) 180-240 MG 24 hr tablet Take 1 tablet by mouth daily. Daily before allergy injections 90 tablet 1  . fluticasone furoate-vilanterol (BREO ELLIPTA) 100-25 MCG/INH AEPB Inhale 1 puff into the lungs daily.    Marland Kitchen lisinopril-hydrochlorothiazide (PRINZIDE,ZESTORETIC) 10-12.5 MG tablet TAKE ONE (1) TABLET BY MOUTH ONCE DAILY 90 tablet 1  . montelukast (SINGULAIR) 10 MG tablet Take 1 tablet (10 mg total) by mouth daily. 90 tablet 1  . azithromycin (ZITHROMAX) 250 MG tablet Take 2 tablets (500 mg) on  Day 1,  followed by 1 tablet (250 mg) once daily on Days 2 through 5.    . fluticasone-salmeterol (ADVAIR HFA) 230-21 MCG/ACT inhaler     . ibuprofen (ADVIL,MOTRIN) 800 MG tablet TAKE ONE (1) TABLET THREE (3) TIMES EACH DAY 270 tablet 1   No facility-administered medications prior to visit.     Review of Systems  Constitutional: Negative for chills, fever, malaise/fatigue and weight loss.  HENT: Positive for rhinorrhea and sneezing. Negative for ear discharge, ear pain, hoarse voice, postnasal drip and sore throat.   Eyes: Negative for blurred vision.  Respiratory: Negative for cough, hemoptysis, sputum production, shortness of breath and wheezing.   Cardiovascular: Negative for chest pain, dyspnea on exertion, palpitations, orthopnea, leg swelling and PND.  Gastrointestinal: Negative for  abdominal pain, blood in stool, constipation, diarrhea, heartburn, melena and nausea.  Genitourinary: Negative for dysuria, frequency, hematuria and urgency.  Musculoskeletal: Negative for back pain, joint pain, myalgias and neck pain.  Skin: Negative for rash.  Neurological: Negative for dizziness, tingling, sensory change, focal weakness and headaches.  Endo/Heme/Allergies: Negative for environmental allergies and polydipsia. Does not bruise/bleed easily.  Psychiatric/Behavioral: Negative for depression and suicidal ideas. The patient is not nervous/anxious and does not have insomnia.      Objective  Vitals:   08/18/16 0932  BP: 130/64  Pulse: 80  Weight: 183 lb (83 kg)  Height: 5\' 10"  (1.778 m)    Physical Exam  Constitutional: He is oriented to person, place, and time and well-developed, well-nourished, and in no distress.  HENT:  Head: Normocephalic and atraumatic.  Right Ear: Tympanic membrane, external ear and ear canal normal.  Left Ear: Tympanic membrane, external ear and ear canal normal.  Nose: Nose normal.  Mouth/Throat: Uvula is midline, oropharynx is clear and moist and mucous membranes are normal.  Eyes: Conjunctivae, EOM and lids are normal. Pupils are equal, round, and reactive to light. Right eye exhibits no discharge. Left eye exhibits no discharge. No scleral icterus.  Fundoscopic exam:      The right eye shows no arteriolar narrowing and no AV nicking.       The left eye shows no arteriolar narrowing and no AV nicking.  Neck: Trachea normal and normal range of motion. Neck supple. Normal carotid pulses, no hepatojugular reflux and no JVD present. No tracheal tenderness present. Carotid bruit is not present. No tracheal deviation present. No thyroid mass and no thyromegaly present.  Cardiovascular: Normal rate, regular rhythm, S1 normal, S2 normal, normal heart sounds, intact distal pulses and normal pulses.  PMI is not displaced.  Exam reveals no gallop, no S3,  no S4 and no friction rub.   No murmur heard. Pulses:      Carotid pulses are 2+ on the right side, and 2+ on the left side.      Radial pulses are 2+ on the right side, and 2+ on the left side.       Femoral pulses are 2+ on the right side, and 2+ on the left side.      Popliteal pulses are 2+ on the right side, and 2+ on the left side.       Dorsalis pedis pulses are 2+ on the right side, and 2+ on the left side.       Posterior tibial pulses are 2+ on the right side, and 2+ on the left side.  Pulmonary/Chest: Effort normal and breath sounds normal. No stridor. No respiratory distress. He has no wheezes. He has no rhonchi. He has no rales. Right breast exhibits no mass. Left breast exhibits no mass.  Abdominal: Soft. Normal aorta and bowel sounds are normal. He exhibits no mass. There is no hepatosplenomegaly. There is no tenderness. There is no rebound, no guarding and no CVA tenderness.  Genitourinary: Rectum normal, prostate normal and testes/scrotum normal.  Musculoskeletal: Normal range of motion. He exhibits no edema or tenderness.       Cervical back: Normal.       Thoracic back: Normal.       Lumbar back: Normal.  Lymphadenopathy:       Head (right side): No submental and no submandibular adenopathy present.       Head (left side): No submental and no submandibular adenopathy present.    He has no cervical adenopathy.  Neurological: He is alert and oriented to person, place, and time. He has normal motor skills, normal sensation, normal strength, normal reflexes and intact cranial nerves. No cranial nerve deficit.  Skin: Skin is warm and intact. No rash noted.  Psychiatric: Mood and affect normal.  Nursing note and vitals reviewed.     Assessment & Plan  Problem List Items Addressed This Visit      Cardiovascular and Mediastinum   Essential (primary) hypertension   Relevant Medications   lisinopril-hydrochlorothiazide (PRINZIDE,ZESTORETIC) 10-12.5 MG tablet    atorvastatin (LIPITOR) 40 MG tablet   Other Relevant Orders   Renal Function Panel     Respiratory   Asthma, chronic, severe persistent, uncomplicated   Relevant Medications   montelukast (SINGULAIR) 10 MG tablet   albuterol (PROVENTIL HFA;VENTOLIN HFA) 108 (90 Base) MCG/ACT inhaler   fluticasone furoate-vilanterol (BREO ELLIPTA) 100-25 MCG/INH AEPB   Seasonal allergic rhinitis due to pollen   Relevant Medications   montelukast (SINGULAIR) 10 MG tablet   fexofenadine (ALLEGRA)  180 MG tablet     Other   Familial multiple lipoprotein-type hyperlipidemia   Relevant Medications   lisinopril-hydrochlorothiazide (PRINZIDE,ZESTORETIC) 10-12.5 MG tablet   atorvastatin (LIPITOR) 40 MG tablet   Other Relevant Orders   Lipid Profile    Other Visit Diagnoses    Annual physical exam    -  Primary   Relevant Orders   POCT occult blood stool (Completed)   Elevated transaminase level       Relevant Orders   Hepatic Function Panel (6)      Meds ordered this encounter  Medications  . lisinopril-hydrochlorothiazide (PRINZIDE,ZESTORETIC) 10-12.5 MG tablet    Sig: TAKE ONE (1) TABLET BY MOUTH ONCE DAILY    Dispense:  90 tablet    Refill:  1    sched appt  . montelukast (SINGULAIR) 10 MG tablet    Sig: Take 1 tablet (10 mg total) by mouth daily.    Dispense:  90 tablet    Refill:  1  . albuterol (PROVENTIL HFA;VENTOLIN HFA) 108 (90 Base) MCG/ACT inhaler    Sig: Inhale 2 puffs into the lungs every 6 (six) hours as needed for wheezing or shortness of breath.    Dispense:  1 Inhaler    Refill:  11  . atorvastatin (LIPITOR) 40 MG tablet    Sig: Take 1 tablet (40 mg total) by mouth daily.    Dispense:  90 tablet    Refill:  1  . fluticasone furoate-vilanterol (BREO ELLIPTA) 100-25 MCG/INH AEPB    Sig: Inhale 3 puffs into the lungs daily.    Dispense:  1 each    Refill:  3  . fexofenadine (ALLEGRA) 180 MG tablet    Sig: Take 1 tablet (180 mg total) by mouth daily.    Dispense:  90  tablet    Refill:  3  pt was seen for both annual physical and med refills he wanted to "get it all over with in one visit." Was advised that he would be receiving a bill for a copay in mail    Dr. [facility]  08/18/16

## 2016-08-19 LAB — HEPATIC FUNCTION PANEL (6)
ALT: 20 IU/L (ref 0–44)
AST: 44 IU/L — ABNORMAL HIGH (ref 0–40)
Alkaline Phosphatase: 74 IU/L (ref 39–117)
Bilirubin Total: 0.5 mg/dL (ref 0.0–1.2)
Bilirubin, Direct: 0.14 mg/dL (ref 0.00–0.40)

## 2016-08-19 LAB — LIPID PANEL
CHOLESTEROL TOTAL: 179 mg/dL (ref 100–199)
Chol/HDL Ratio: 3.1 ratio (ref 0.0–5.0)
HDL: 58 mg/dL (ref >39)
LDL Calculated: 103 mg/dL — ABNORMAL HIGH (ref 0–99)
TRIGLYCERIDES: 92 mg/dL (ref 0–149)
VLDL Cholesterol Cal: 18 mg/dL (ref 5–40)

## 2016-08-19 LAB — RENAL FUNCTION PANEL
ALBUMIN: 4.7 g/dL (ref 3.5–4.8)
BUN/Creatinine Ratio: 14 (ref 10–24)
BUN: 15 mg/dL (ref 8–27)
CALCIUM: 9.4 mg/dL (ref 8.6–10.2)
CO2: 24 mmol/L (ref 20–29)
Chloride: 100 mmol/L (ref 96–106)
Creatinine, Ser: 1.07 mg/dL (ref 0.76–1.27)
GFR calc Af Amer: 79 mL/min/{1.73_m2} (ref >59)
GFR calc non Af Amer: 68 mL/min/{1.73_m2} (ref >59)
GLUCOSE: 95 mg/dL (ref 65–99)
PHOSPHORUS: 3.3 mg/dL (ref 2.5–4.5)
POTASSIUM: 4.3 mmol/L (ref 3.5–5.2)
Sodium: 141 mmol/L (ref 134–144)

## 2016-08-20 ENCOUNTER — Other Ambulatory Visit: Payer: Self-pay

## 2016-08-21 DIAGNOSIS — J301 Allergic rhinitis due to pollen: Secondary | ICD-10-CM | POA: Diagnosis not present

## 2016-08-25 DIAGNOSIS — J301 Allergic rhinitis due to pollen: Secondary | ICD-10-CM | POA: Diagnosis not present

## 2016-09-04 DIAGNOSIS — J301 Allergic rhinitis due to pollen: Secondary | ICD-10-CM | POA: Diagnosis not present

## 2016-09-15 ENCOUNTER — Ambulatory Visit: Payer: Medicare PPO

## 2016-09-22 DIAGNOSIS — J301 Allergic rhinitis due to pollen: Secondary | ICD-10-CM | POA: Diagnosis not present

## 2016-09-30 DIAGNOSIS — J301 Allergic rhinitis due to pollen: Secondary | ICD-10-CM | POA: Diagnosis not present

## 2016-10-02 ENCOUNTER — Other Ambulatory Visit: Payer: Self-pay | Admitting: Family Medicine

## 2016-10-02 DIAGNOSIS — M5136 Other intervertebral disc degeneration, lumbar region: Secondary | ICD-10-CM

## 2016-10-16 DIAGNOSIS — J301 Allergic rhinitis due to pollen: Secondary | ICD-10-CM | POA: Diagnosis not present

## 2016-10-20 ENCOUNTER — Ambulatory Visit: Payer: Medicare PPO

## 2016-10-20 DIAGNOSIS — J301 Allergic rhinitis due to pollen: Secondary | ICD-10-CM | POA: Diagnosis not present

## 2016-10-27 DIAGNOSIS — J301 Allergic rhinitis due to pollen: Secondary | ICD-10-CM | POA: Diagnosis not present

## 2016-11-18 DIAGNOSIS — J301 Allergic rhinitis due to pollen: Secondary | ICD-10-CM | POA: Diagnosis not present

## 2016-11-20 DIAGNOSIS — G4733 Obstructive sleep apnea (adult) (pediatric): Secondary | ICD-10-CM | POA: Diagnosis not present

## 2016-11-20 DIAGNOSIS — R0602 Shortness of breath: Secondary | ICD-10-CM | POA: Diagnosis not present

## 2016-11-20 DIAGNOSIS — R0683 Snoring: Secondary | ICD-10-CM | POA: Diagnosis not present

## 2016-11-20 DIAGNOSIS — J449 Chronic obstructive pulmonary disease, unspecified: Secondary | ICD-10-CM | POA: Diagnosis not present

## 2016-11-21 ENCOUNTER — Ambulatory Visit: Payer: Medicare PPO | Admitting: Urology

## 2016-11-25 DIAGNOSIS — J301 Allergic rhinitis due to pollen: Secondary | ICD-10-CM | POA: Diagnosis not present

## 2016-11-28 ENCOUNTER — Ambulatory Visit: Payer: Medicare PPO | Admitting: Urology

## 2016-12-11 DIAGNOSIS — G4733 Obstructive sleep apnea (adult) (pediatric): Secondary | ICD-10-CM | POA: Diagnosis not present

## 2016-12-15 DIAGNOSIS — J301 Allergic rhinitis due to pollen: Secondary | ICD-10-CM | POA: Diagnosis not present

## 2016-12-19 ENCOUNTER — Ambulatory Visit: Payer: Medicare PPO | Admitting: Urology

## 2016-12-22 DIAGNOSIS — J301 Allergic rhinitis due to pollen: Secondary | ICD-10-CM | POA: Diagnosis not present

## 2017-01-08 DIAGNOSIS — J301 Allergic rhinitis due to pollen: Secondary | ICD-10-CM | POA: Diagnosis not present

## 2017-01-22 NOTE — Progress Notes (Signed)
01/23/2017 9:56 AM   Marcus Delacruz 1940/04/10 [id number]  Referring provider: Juline Patch, MD [id number] [place], [place] [address]  Chief Complaint  Patient presents with  . Benign Prostatic Hypertrophy    HPI: Patient is a 76 year old Caucasian male with BPH with LUTS who presents today for a yearly follow up.  BPH WITH LUTS His IPSS score today is 0, which is no lower urinary tract symptomatology. He is pleased with his quality life due to his urinary symptoms.  His I PSS score was 1/0.  His major complaint today is nocturia x 1, but it is not bothersome to him.  He has had these symptoms for several years.  He denies any dysuria, hematuria or suprapubic pain.  He currently taking finasteride 5 mg daily.  He also denies any recent fevers, chills, nausea or vomiting.   He does not have a family history of PCa.  IPSS    Row Name 01/23/17 0900         International Prostate Symptom Score   How often have you had the sensation of not emptying your bladder?  Not at All     How often have you had to urinate less than every two hours?  Not at All     How often have you found you stopped and started again several times when you urinated?  Not at All     How often have you found it difficult to postpone urination?  Not at All     How often have you had a weak urinary stream?  Not at All     How often have you had to strain to start urination?  Not at All     How many times did you typically get up at night to urinate?  None     Total IPSS Score  0       Quality of Life due to urinary symptoms   If you were to spend the rest of your life with your urinary condition just the way it is now how would you feel about that?  Pleased        Score:  1-7 Mild 8-19 Moderate 20-35 Severe    PMH: Past Medical History:  Diagnosis Date  . Asthma   . BPH (benign prostatic hyperplasia)   . Hypercholesteremia   . Hypertension     Surgical History: Past  Surgical History:  Procedure Laterality Date  . ARTERY REPAIR    . BACK SURGERY    . CHOLECYSTECTOMY    . COLONOSCOPY  2013   cleared  . HERNIA REPAIR      Home Medications:  Allergies as of 01/23/2017      Reactions   Codeine Itching      Medication List        Accurate as of 01/23/17  9:56 AM. Always use your most recent med list.          albuterol (2.5 MG/3ML) 0.083% nebulizer solution Commonly known as:  PROVENTIL USE ONE VIAL BY NEBULIZER ONCE DAILY AS DIRECTED BY PHYSICIAN.   albuterol 108 (90 Base) MCG/ACT inhaler Commonly known as:  PROVENTIL HFA;VENTOLIN HFA Inhale 2 puffs into the lungs every 6 (six) hours as needed for wheezing or shortness of breath.   ANORO ELLIPTA IN Inhale 1 puff daily at 6 (six) AM into the lungs.   aspirin 81 MG tablet Take 81 mg by mouth daily.   atorvastatin 40 MG  tablet Commonly known as:  LIPITOR Take 1 tablet (40 mg total) by mouth daily.   fexofenadine 180 MG tablet Commonly known as:  ALLEGRA Take 1 tablet (180 mg total) by mouth daily.   finasteride 5 MG tablet Commonly known as:  PROSCAR Take 1 tablet (5 mg total) daily by mouth.   ibuprofen 800 MG tablet Commonly known as:  ADVIL,MOTRIN TAKE (1) TABLET BY MOUTH THREE TIMES A DAY   lisinopril-hydrochlorothiazide 10-12.5 MG tablet Commonly known as:  PRINZIDE,ZESTORETIC TAKE ONE (1) TABLET BY MOUTH ONCE DAILY   montelukast 10 MG tablet Commonly known as:  SINGULAIR Take 1 tablet (10 mg total) by mouth daily.       Allergies:  Allergies  Allergen Reactions  . Codeine Itching    Family History: Family History  Problem Relation Age of Onset  . COPD Mother   . Asthma Son   . Cancer Father        lung  . Prostate cancer Neg Hx   . Kidney disease Neg Hx     Social History:  reports that he quit smoking about 33 years ago. His smoking use included cigarettes. He has a 20.00 pack-year smoking history. he has never used smokeless tobacco. He reports  that he drinks alcohol. He reports that he does not use drugs.  ROS: UROLOGY Frequent Urination?: No Hard to postpone urination?: No Burning/pain with urination?: No Get up at night to urinate?: No Leakage of urine?: No Urine stream starts and stops?: No Trouble starting stream?: No Do you have to strain to urinate?: No Blood in urine?: No Urinary tract infection?: No Sexually transmitted disease?: No Injury to kidneys or bladder?: No Painful intercourse?: No Weak stream?: No Erection problems?: No Penile pain?: No  Gastrointestinal Nausea?: No Vomiting?: No Indigestion/heartburn?: No Diarrhea?: No Constipation?: No  Constitutional Fever: No Night sweats?: No Weight loss?: No Fatigue?: No  Skin Skin rash/lesions?: No Itching?: No  Eyes Blurred vision?: No Double vision?: No  Ears/Nose/Throat Sore throat?: No Sinus problems?: No  Hematologic/Lymphatic Swollen glands?: No Easy bruising?: No  Cardiovascular Leg swelling?: No Chest pain?: No  Respiratory Cough?: No Shortness of breath?: No  Endocrine Excessive thirst?: No  Musculoskeletal Back pain?: No Joint pain?: No  Neurological Headaches?: No Dizziness?: No  Psychologic Depression?: No Anxiety?: No  Physical Exam: BP (!) 141/97   Pulse 74   Ht 5\' 10"  (1.778 m)   Wt 186 lb (84.4 kg)   BMI 26.69 kg/m   Constitutional: Well nourished. Alert and oriented, No acute distress. HEENT: [place] AT, moist mucus membranes. Trachea midline, no masses. Cardiovascular: No clubbing, cyanosis, or edema. Respiratory: Normal respiratory effort, no increased work of breathing. GI: Abdomen is soft, non tender, non distended, no abdominal masses. Liver and spleen not palpable.  No hernias appreciated.  Stool sample for occult testing is not indicated.   GU: No CVA tenderness.  No bladder fullness or masses.  Patient with circumcised phallus.   Urethral meatus is patent.  No penile discharge. No penile  lesions or rashes. Scrotum without lesions, cysts, rashes and/or edema.  Testicles are located scrotally bilaterally. No masses are appreciated in the testicles. Left and right epididymis are normal. Rectal: Patient with  normal sphincter tone. Anus and perineum without scarring or rashes. No rectal masses are appreciated. Prostate is approximately 50 grams, no nodules are appreciated. Seminal vesicles are normal. Skin: No rashes, bruises or suspicious lesions. Lymph: No cervical or inguinal adenopathy. Neurologic: Grossly intact, no focal  deficits, moving all 4 extremities. Psychiatric: Normal mood and affect.  Laboratory Data: Lab Results  Component Value Date   CREATININE 1.07 08/18/2016   PSA  0.5 ng/mL on 10/31/2015  I have reviewed the labs.  Assessment & Plan:    1. BPH with LUTS  - IPSS score is 0/1, it is stable  - Continue conservative management, avoiding bladder irritants and timed voiding's  - Continue finasteride 5 mg daily; refills given  - RTC in 12 months for IPSS and exam   Return in about 1 year (around 01/23/2018) for IPSS and PVR.  Zara Council, [facility] [address] [place], [place] [address] [phone]

## 2017-01-23 ENCOUNTER — Encounter: Payer: Self-pay | Admitting: Urology

## 2017-01-23 ENCOUNTER — Ambulatory Visit (INDEPENDENT_AMBULATORY_CARE_PROVIDER_SITE_OTHER): Payer: Medicare PPO | Admitting: Urology

## 2017-01-23 VITALS — BP 141/97 | HR 74 | Ht 70.0 in | Wt 186.0 lb

## 2017-01-23 DIAGNOSIS — N401 Enlarged prostate with lower urinary tract symptoms: Secondary | ICD-10-CM

## 2017-01-23 DIAGNOSIS — N138 Other obstructive and reflux uropathy: Secondary | ICD-10-CM

## 2017-01-23 MED ORDER — FINASTERIDE 5 MG PO TABS: 5.0000 mg | ORAL_TABLET | Freq: Every day | ORAL | 4 refills | Status: DC

## 2017-02-24 ENCOUNTER — Other Ambulatory Visit: Payer: Self-pay | Admitting: Family Medicine

## 2017-02-24 DIAGNOSIS — E7849 Other hyperlipidemia: Secondary | ICD-10-CM

## 2017-02-24 DIAGNOSIS — I10 Essential (primary) hypertension: Secondary | ICD-10-CM

## 2017-02-24 DIAGNOSIS — J4550 Severe persistent asthma, uncomplicated: Secondary | ICD-10-CM

## 2017-02-24 DIAGNOSIS — J301 Allergic rhinitis due to pollen: Secondary | ICD-10-CM

## 2017-03-12 DIAGNOSIS — G4733 Obstructive sleep apnea (adult) (pediatric): Secondary | ICD-10-CM | POA: Diagnosis not present

## 2017-03-18 ENCOUNTER — Ambulatory Visit: Payer: Medicare PPO

## 2017-03-18 DIAGNOSIS — G4733 Obstructive sleep apnea (adult) (pediatric): Secondary | ICD-10-CM | POA: Diagnosis not present

## 2017-03-23 ENCOUNTER — Encounter: Payer: Self-pay | Admitting: Internal Medicine

## 2017-03-23 ENCOUNTER — Ambulatory Visit: Payer: Medicare PPO | Admitting: Internal Medicine

## 2017-03-23 VITALS — BP 115/48 | HR 63 | Resp 16 | Ht 70.0 in | Wt 185.8 lb

## 2017-03-23 DIAGNOSIS — R0602 Shortness of breath: Secondary | ICD-10-CM

## 2017-03-23 DIAGNOSIS — J449 Chronic obstructive pulmonary disease, unspecified: Secondary | ICD-10-CM

## 2017-03-23 DIAGNOSIS — G4733 Obstructive sleep apnea (adult) (pediatric): Secondary | ICD-10-CM | POA: Diagnosis not present

## 2017-03-23 DIAGNOSIS — Z9989 Dependence on other enabling machines and devices: Secondary | ICD-10-CM | POA: Diagnosis not present

## 2017-03-23 MED ORDER — UMECLIDINIUM-VILANTEROL 62.5-25 MCG/INH IN AEPB: 1.0000 | INHALATION_SPRAY | Freq: Every day | RESPIRATORY_TRACT | 4 refills | Status: DC

## 2017-03-23 NOTE — Patient Instructions (Signed)

## 2017-03-23 NOTE — Progress Notes (Signed)
[facility] [place], [place] [address]  Pulmonary Sleep Medicine  Office Visit Note  Patient Name: Marcus Delacruz DOB: 1940/03/15 MRN [id number]  Date of Service: 03/23/2017     Complaints/HPI: overall doing well. No admissions. He states he is using his CPAP with good results. No EDS noted. Snoring is improved. He states no headaches. No nocturnal arousals noted. His spiro today looks a bit reduced but is better than 2017  Current Medication: Outpatient Encounter Medications as of 03/23/2017  Medication Sig  . albuterol (PROVENTIL HFA;VENTOLIN HFA) 108 (90 Base) MCG/ACT inhaler Inhale 2 puffs into the lungs every 6 (six) hours as needed for wheezing or shortness of breath.  Marland Kitchen albuterol (PROVENTIL) (2.5 MG/3ML) 0.083% nebulizer solution USE ONE VIAL BY NEBULIZER ONCE DAILY AS DIRECTED BY PHYSICIAN.  Marland Kitchen aspirin 81 MG tablet Take 81 mg by mouth daily.  Marland Kitchen atorvastatin (LIPITOR) 40 MG tablet TAKE (1) TABLET BY MOUTH EVERY DAY  . fexofenadine (ALLEGRA) 180 MG tablet Take 1 tablet (180 mg total) by mouth daily.  . finasteride (PROSCAR) 5 MG tablet Take 1 tablet (5 mg total) daily by mouth.  Marland Kitchen ibuprofen (ADVIL,MOTRIN) 800 MG tablet TAKE (1) TABLET BY MOUTH THREE TIMES A DAY  . lisinopril-hydrochlorothiazide (PRINZIDE,ZESTORETIC) 10-12.5 MG tablet TAKE (1) TABLET BY MOUTH EVERY DAY  . montelukast (SINGULAIR) 10 MG tablet TAKE (1) TABLET BY MOUTH EVERY DAY  . rosuvastatin (CRESTOR) 20 MG tablet Take 20 mg by mouth daily.  Marland Kitchen Umeclidinium-Vilanterol (ANORO ELLIPTA IN) Inhale 1 puff daily at 6 (six) AM into the lungs.   No facility-administered encounter medications on file as of 03/23/2017.     Surgical History: Past Surgical History:  Procedure Laterality Date  . ARTERY REPAIR    . BACK SURGERY    . CHOLECYSTECTOMY    . COLONOSCOPY  2013   cleared  . HERNIA REPAIR      Medical History: Past Medical History:  Diagnosis Date  . Allergic rhinitis due  to pollen   . Asthma   . BPH (benign prostatic hyperplasia)   . Cardiomegaly   . Chronic obstructive pulmonary disease (COPD) ([place])   . Hypercholesteremia   . Hypersomnia, unspecified   . Hypertension   . Obstructive sleep apnea (adult) (pediatric)   . Shortness of breath   . Snoring   . Solitary pulmonary nodule     Family History: Family History  Problem Relation Age of Onset  . COPD Mother   . Asthma Son   . Cancer Father        lung  . Prostate cancer Neg Hx   . Kidney disease Neg Hx     Social History: Social History   Socioeconomic History  . Marital status: Married    Spouse name: Not on file  . Number of children: Not on file  . Years of education: Not on file  . Highest education level: Not on file  Social Needs  . Financial resource strain: Not on file  . Food insecurity - worry: Not on file  . Food insecurity - inability: Not on file  . Transportation needs - medical: Not on file  . Transportation needs - non-medical: Not on file  Occupational History  . Not on file  Tobacco Use  . Smoking status: Former Smoker    Packs/day: 1.00    Years: 20.00    Pack years: 20.00    Types: Cigarettes    Last attempt to quit: 08/09/1983  Years since quitting: 33.6  . Smokeless tobacco: Never Used  Substance and Sexual Activity  . Alcohol use: Yes    Alcohol/week: 0.0 oz    Comment: once monthly  . Drug use: No  . Sexual activity: Not on file  Other Topics Concern  . Not on file  Social History Narrative  . Not on file     ROS  General: (-) fever, (-) chills, (-) night sweats, (-) weakness, (-) changes in appetite. Skin: (-) rashes, (-) itching,. Eyes: (-) visual changes, (-) redness, (-) itching, (-) double or blurred vision. Nose and Sinuses: (-) nasal stuffiness or itchiness, (-) postnasal drip, (-) nosebleeds, (-) sinus trouble. Mouth and Throat: (-) sore throat, (-) hoarseness. Neck: (-) swollen glands, (-) enlarged thyroid, (-) neck  pain. Respiratory: (-) cough, (-) bloody sputum, (-) shortness of breath, (-) wheezing. Cardiovascular: (-) ankle swelling, (-) chest pain. Lymphatic: (-) lymph node enlargement, (-) lymph node tenderness. Neurologic: (-) numbness, (-) tingling,(-) dizziness. Psychiatric: (-) anxiety, (-) depression.  Vital Signs: Blood pressure (!) 115/48, pulse 63, resp. rate 16, height 5\' 10"  (1.778 m), weight 185 lb 12.8 oz (84.3 kg), SpO2 95 %.  Examination: General Appearance: The patient is well-developed, well-nourished, and in no distress. Skin: Gross inspection of skin demonstrates no evidence of abnormality. Head: Patient's head is normocephalic, no gross deformities. Eyes: no gross deformities noted. ENT: ears appear grossly normal. Nasopharynx appears to be normal. Neck: Supple. No thyromegaly. No LAD. Respiratory: Lungs are clear to auscultation with no adventitious sounds. Cardiovascular: Normal S1 and S2 without murmur or rub. Extremities: No cyanosis. pulses are equal. Neurologic: Alert and oriented. No involuntary movements.  LABS: No results found for this or any previous visit (from the past 2160 hour(s)).  Radiology: US Scrotum  Result Date: 10/10/2014 CLINICAL DATA:  77 year old male with right testicle swelling. Initial encounter. EXAM: SCROTAL ULTRASOUND DOPPLER ULTRASOUND OF THE TESTICLES TECHNIQUE: Complete ultrasound examination of the testicles, epididymis, and other scrotal structures was performed. Color and spectral Doppler ultrasound were also utilized to evaluate blood flow to the testicles. COMPARISON:  None. FINDINGS: Right testicle Measurements: 4.0 x 1.6 x 2.5 cm. No mass or microlithiasis visualized. Left testicle Measurements: 3.5 x 1.5 x 2.6 cm. No mass or microlithiasis visualized. Right epididymis:  Within normal limits in size and appearance. Left epididymis:  Within normal limits in size and appearance. Hydrocele: Small and relatively symmetric simple appearing  bilateral hydroceles Varicocele:  None visualized. Pulsed Doppler interrogation of both testes demonstrates normal low resistance arterial and venous waveforms bilaterally. IMPRESSION: No evidence of testicular torsion and negative scrotal ultrasound aside from small simple appearing bilateral hydroceles. Electronically Signed   By: [name] M.D.   On: 10/10/2014 15:30   [place] Art/ven Flow Abd Pelv Doppler  Result Date: 10/10/2014 CLINICAL DATA:  77 year old male with right testicle swelling. Initial encounter. EXAM: SCROTAL ULTRASOUND DOPPLER ULTRASOUND OF THE TESTICLES TECHNIQUE: Complete ultrasound examination of the testicles, epididymis, and other scrotal structures was performed. Color and spectral Doppler ultrasound were also utilized to evaluate blood flow to the testicles. COMPARISON:  None. FINDINGS: Right testicle Measurements: 4.0 x 1.6 x 2.5 cm. No mass or microlithiasis visualized. Left testicle Measurements: 3.5 x 1.5 x 2.6 cm. No mass or microlithiasis visualized. Right epididymis:  Within normal limits in size and appearance. Left epididymis:  Within normal limits in size and appearance. Hydrocele: Small and relatively symmetric simple appearing bilateral hydroceles Varicocele:  None visualized. Pulsed Doppler interrogation of both testes  demonstrates normal low resistance arterial and venous waveforms bilaterally. IMPRESSION: No evidence of testicular torsion and negative scrotal ultrasound aside from small simple appearing bilateral hydroceles. Electronically Signed   By: [name] M.D.   On: 10/10/2014 15:30    No results found.  No results found.    Assessment and Plan: Patient Active Problem List   Diagnosis Date Noted  . Asthma, chronic, severe persistent, uncomplicated 40/98/1191  . Seasonal allergic rhinitis due to pollen 08/18/2016  . Bilateral carotid artery stenosis 04/03/2016  . Bilateral hydrocele 11/22/2014  . BPH with obstruction/lower urinary tract symptoms 10/05/2014   . Epididymal cyst 10/05/2014  . Familial multiple lipoprotein-type hyperlipidemia 07/14/2014  . Laboratory animal allergy 07/14/2014  . Acute arthropathy 07/14/2014  . Routine general medical examination at a health care facility 07/14/2014  . Asthma with exacerbation 07/14/2014  . Essential (primary) hypertension 07/14/2014  . Screening for depression 07/14/2014  . Noncompliance w/medication treatment due to intermit use of medication 11/23/2013  . Asthma, chronic 08/08/2013    1. OSA Much improved will continue with CPAP device as orderd  2. COPD Will get follow up PFT full Will contionue with inhalers as needed on anora and rescue  3. Shortness of breath He is on baseline therapy will continje   General Counseling: I have discussed the findings of the evaluation and examination with Leontae.  I have also discussed any further diagnostic evaluation thatmay be needed or ordered today. Ubaldo verbalizes understanding of the findings of todays visit. We also reviewed his medications today and discussed drug interactions and side effects including but not limited excessive drowsiness and altered mental states. We also discussed that there is always a risk not just to him but also people around him. he has been encouraged to call the office with any questions or concerns that should arise related to todays visit.    Time spent: 20min  I have personally obtained a history, examined the patient, evaluated laboratory and imaging results, formulated the assessment and plan and placed orders.    Allyne Gee, MD [facility] Pulmonary and Critical Care Sleep medicine

## 2017-04-06 ENCOUNTER — Ambulatory Visit (INDEPENDENT_AMBULATORY_CARE_PROVIDER_SITE_OTHER): Payer: Medicare Other | Admitting: Vascular Surgery

## 2017-04-06 ENCOUNTER — Encounter (INDEPENDENT_AMBULATORY_CARE_PROVIDER_SITE_OTHER): Payer: Medicare Other

## 2017-04-16 ENCOUNTER — Encounter: Payer: Self-pay | Admitting: Family Medicine

## 2017-04-16 ENCOUNTER — Ambulatory Visit: Payer: Medicare PPO | Admitting: Family Medicine

## 2017-04-16 VITALS — BP 140/86 | HR 80 | Temp 98.7°F | Ht 70.0 in | Wt 183.0 lb

## 2017-04-16 DIAGNOSIS — J4521 Mild intermittent asthma with (acute) exacerbation: Secondary | ICD-10-CM | POA: Diagnosis not present

## 2017-04-16 DIAGNOSIS — J40 Bronchitis, not specified as acute or chronic: Secondary | ICD-10-CM

## 2017-04-16 MED ORDER — GUAIFENESIN-CODEINE 100-10 MG/5ML PO SYRP: 5.0000 mL | ORAL_SOLUTION | Freq: Three times a day (TID) | ORAL | 0 refills | Status: DC | PRN

## 2017-04-16 MED ORDER — IPRATROPIUM-ALBUTEROL 0.5-2.5 (3) MG/3ML IN SOLN: 3.0000 mL | Freq: Once | RESPIRATORY_TRACT | Status: DC

## 2017-04-16 MED ORDER — AZITHROMYCIN 250 MG PO TABS: ORAL_TABLET | ORAL | 0 refills | Status: DC

## 2017-04-16 NOTE — Progress Notes (Signed)
Name: Marcus Delacruz   MRN: [id number]    DOB: 1940/12/20   Date:04/16/2017       Progress Note  Subjective  Chief Complaint  Chief Complaint  Patient presents with  . Cough    sob- used neb twice yesterday    Cough  This is a new problem. The current episode started yesterday. The problem has been gradually worsening. The problem occurs every few minutes. The cough is productive of purulent sputum (yellow/green). Associated symptoms include myalgias, nasal congestion, rhinorrhea, shortness of breath and wheezing. Pertinent negatives include no chest pain, chills, ear congestion, ear pain, fever, headaches, heartburn, hemoptysis, postnasal drip, rash, sore throat, sweats or weight loss. The symptoms are aggravated by dust. He has tried a beta-agonist inhaler for the symptoms. The treatment provided no relief. His past medical history is significant for asthma and bronchitis. There is no history of bronchiectasis, COPD, emphysema or environmental allergies.  Sinusitis  This is a new problem. The current episode started yesterday. The problem has been gradually worsening since onset. There has been no fever. Associated symptoms include coughing and shortness of breath. Pertinent negatives include no chills, ear pain, headaches, neck pain or sore throat. Past treatments include nothing.    No problem-specific Assessment & Plan notes found for this encounter.   Past Medical History:  Diagnosis Date  . Allergic rhinitis due to pollen   . Asthma   . BPH (benign prostatic hyperplasia)   . Cardiomegaly   . Chronic obstructive pulmonary disease (COPD) ([place])   . Hypercholesteremia   . Hypersomnia, unspecified   . Hypertension   . Obstructive sleep apnea (adult) (pediatric)   . Shortness of breath   . Snoring   . Solitary pulmonary nodule     Past Surgical History:  Procedure Laterality Date  . ARTERY REPAIR    . BACK SURGERY    . CHOLECYSTECTOMY    . COLONOSCOPY  2013   cleared   . HERNIA REPAIR      Family History  Problem Relation Age of Onset  . COPD Mother   . Asthma Son   . Cancer Father        lung  . Prostate cancer Neg Hx   . Kidney disease Neg Hx     Social History   Socioeconomic History  . Marital status: Married    Spouse name: Not on file  . Number of children: Not on file  . Years of education: Not on file  . Highest education level: Not on file  Social Needs  . Financial resource strain: Not on file  . Food insecurity - worry: Not on file  . Food insecurity - inability: Not on file  . Transportation needs - medical: Not on file  . Transportation needs - non-medical: Not on file  Occupational History  . Not on file  Tobacco Use  . Smoking status: Former Smoker    Packs/day: 1.00    Years: 20.00    Pack years: 20.00    Types: Cigarettes    Last attempt to quit: 08/09/1983    Years since quitting: 33.7  . Smokeless tobacco: Never Used  Substance and Sexual Activity  . Alcohol use: Yes    Alcohol/week: 0.0 oz    Comment: once monthly  . Drug use: No  . Sexual activity: Not on file  Other Topics Concern  . Not on file  Social History Narrative  . Not on file    Allergies  Allergen Reactions  .  Codeine Itching    Outpatient Medications Prior to Visit  Medication Sig Dispense Refill  . albuterol (PROVENTIL HFA;VENTOLIN HFA) 108 (90 Base) MCG/ACT inhaler Inhale 2 puffs into the lungs every 6 (six) hours as needed for wheezing or shortness of breath. 1 Inhaler 11  . albuterol (PROVENTIL) (2.5 MG/3ML) 0.083% nebulizer solution USE ONE VIAL BY NEBULIZER ONCE DAILY AS DIRECTED BY PHYSICIAN. 75 mL 1  . aspirin 81 MG tablet Take 81 mg by mouth daily.    Marland Kitchen atorvastatin (LIPITOR) 40 MG tablet TAKE (1) TABLET BY MOUTH EVERY DAY 90 tablet 0  . fexofenadine (ALLEGRA) 180 MG tablet Take 1 tablet (180 mg total) by mouth daily. 90 tablet 3  . finasteride (PROSCAR) 5 MG tablet Take 1 tablet (5 mg total) daily by mouth. (Patient taking  differently: Take 5 mg by mouth daily. Shannon Mcgowan) 90 tablet 4  . ibuprofen (ADVIL,MOTRIN) 800 MG tablet TAKE (1) TABLET BY MOUTH THREE TIMES A DAY 270 tablet 0  . lisinopril-hydrochlorothiazide (PRINZIDE,ZESTORETIC) 10-12.5 MG tablet TAKE (1) TABLET BY MOUTH EVERY DAY 90 tablet 0  . montelukast (SINGULAIR) 10 MG tablet TAKE (1) TABLET BY MOUTH EVERY DAY 90 tablet 0  . umeclidinium-vilanterol (ANORO ELLIPTA) 62.5-25 MCG/INH AEPB Inhale 1 puff into the lungs daily. (Patient taking differently: Inhale 1 puff into the lungs daily. Dr Chancy Milroy) 1 each 4  . rosuvastatin (CRESTOR) 20 MG tablet Take 20 mg by mouth daily.     No facility-administered medications prior to visit.     Review of Systems  Constitutional: Negative for chills, fever, malaise/fatigue and weight loss.  HENT: Positive for rhinorrhea. Negative for ear discharge, ear pain, postnasal drip and sore throat.   Eyes: Negative for blurred vision.  Respiratory: Positive for cough, shortness of breath and wheezing. Negative for hemoptysis and sputum production.   Cardiovascular: Negative for chest pain, palpitations and leg swelling.  Gastrointestinal: Negative for abdominal pain, blood in stool, constipation, diarrhea, heartburn, melena and nausea.  Genitourinary: Negative for dysuria, frequency, hematuria and urgency.  Musculoskeletal: Positive for myalgias. Negative for back pain, joint pain and neck pain.  Skin: Negative for rash.  Neurological: Negative for dizziness, tingling, sensory change, focal weakness and headaches.  Endo/Heme/Allergies: Negative for environmental allergies and polydipsia. Does not bruise/bleed easily.  Psychiatric/Behavioral: Negative for depression and suicidal ideas. The patient is not nervous/anxious and does not have insomnia.      Objective  Vitals:   04/16/17 0815  BP: 140/86  Pulse: 80  Temp: 98.7 F (37.1 C)  TempSrc: Oral  SpO2: 95%  Weight: 183 lb (83 kg)  Height: 5\' 10"  (1.778 m)     Physical Exam  Constitutional: He is oriented to person, place, and time and well-developed, well-nourished, and in no distress.  HENT:  Head: Normocephalic.  Right Ear: External ear normal.  Left Ear: External ear normal.  Nose: Nose normal.  Mouth/Throat: Oropharynx is clear and moist.  Eyes: Conjunctivae and EOM are normal. Pupils are equal, round, and reactive to light. Right eye exhibits no discharge. Left eye exhibits no discharge. No scleral icterus.  Neck: Normal range of motion. Neck supple. No JVD present. No tracheal deviation present. No thyromegaly present.  Cardiovascular: Normal rate, regular rhythm, normal heart sounds and intact distal pulses. Exam reveals no gallop and no friction rub.  No murmur heard. Pulmonary/Chest: No respiratory distress. He has wheezes. He has no rales.  Abdominal: Soft. Bowel sounds are normal. He exhibits no mass. There is no hepatosplenomegaly. There  is no tenderness. There is no rebound, no guarding and no CVA tenderness.  Musculoskeletal: Normal range of motion. He exhibits no edema or tenderness.  Lymphadenopathy:    He has no cervical adenopathy.  Neurological: He is alert and oriented to person, place, and time. He has normal sensation, normal strength, normal reflexes and intact cranial nerves. No cranial nerve deficit.  Skin: Skin is warm. No rash noted.  Psychiatric: Mood and affect normal.  Nursing note and vitals reviewed.     Assessment & Plan  Problem List Items Addressed This Visit      Respiratory   Asthma with exacerbation - Primary   Relevant Medications   ipratropium-albuterol (DUONEB) 0.5-2.5 (3) MG/3ML nebulizer solution 3 mL    Other Visit Diagnoses    Bronchitis       Relevant Medications   azithromycin (ZITHROMAX) 250 MG tablet   guaiFENesin-codeine (ROBITUSSIN AC) 100-10 MG/5ML syrup      Meds ordered this encounter  Medications  . ipratropium-albuterol (DUONEB) 0.5-2.5 (3) MG/3ML nebulizer solution  3 mL  . azithromycin (ZITHROMAX) 250 MG tablet    Sig: 2 today then 1 a day for 4 days    Dispense:  6 tablet    Refill:  0  . guaiFENesin-codeine (ROBITUSSIN AC) 100-10 MG/5ML syrup    Sig: Take 5 mLs by mouth 3 (three) times daily as needed for cough.    Dispense:  150 mL    Refill:  0      Dr. Otilio Miu [facility]  04/16/17

## 2017-04-16 NOTE — Progress Notes (Deleted)
Name: Marcus Delacruz   MRN: [id number]    DOB: 10/13/1940   Date:04/16/2017       Progress Note  Subjective  Chief Complaint  Chief Complaint  Patient presents with  . Cough    sob- used neb twice yesterday    Cough     No problem-specific Assessment & Plan notes found for this encounter.   Past Medical History:  Diagnosis Date  . Allergic rhinitis due to pollen   . Asthma   . BPH (benign prostatic hyperplasia)   . Cardiomegaly   . Chronic obstructive pulmonary disease (COPD) ([place])   . Hypercholesteremia   . Hypersomnia, unspecified   . Hypertension   . Obstructive sleep apnea (adult) (pediatric)   . Shortness of breath   . Snoring   . Solitary pulmonary nodule     Past Surgical History:  Procedure Laterality Date  . ARTERY REPAIR    . BACK SURGERY    . CHOLECYSTECTOMY    . COLONOSCOPY  2013   cleared  . HERNIA REPAIR      Family History  Problem Relation Age of Onset  . COPD Mother   . Asthma Son   . Cancer Father        lung  . Prostate cancer Neg Hx   . Kidney disease Neg Hx     Social History   Socioeconomic History  . Marital status: Married    Spouse name: Not on file  . Number of children: Not on file  . Years of education: Not on file  . Highest education level: Not on file  Social Needs  . Financial resource strain: Not on file  . Food insecurity - worry: Not on file  . Food insecurity - inability: Not on file  . Transportation needs - medical: Not on file  . Transportation needs - non-medical: Not on file  Occupational History  . Not on file  Tobacco Use  . Smoking status: Former Smoker    Packs/day: 1.00    Years: 20.00    Pack years: 20.00    Types: Cigarettes    Last attempt to quit: 08/09/1983    Years since quitting: 33.7  . Smokeless tobacco: Never Used  Substance and Sexual Activity  . Alcohol use: Yes    Alcohol/week: 0.0 oz    Comment: once monthly  . Drug use: No  . Sexual activity: Not on file  Other Topics  Concern  . Not on file  Social History Narrative  . Not on file    Allergies  Allergen Reactions  . Codeine Itching    Outpatient Medications Prior to Visit  Medication Sig Dispense Refill  . albuterol (PROVENTIL HFA;VENTOLIN HFA) 108 (90 Base) MCG/ACT inhaler Inhale 2 puffs into the lungs every 6 (six) hours as needed for wheezing or shortness of breath. 1 Inhaler 11  . albuterol (PROVENTIL) (2.5 MG/3ML) 0.083% nebulizer solution USE ONE VIAL BY NEBULIZER ONCE DAILY AS DIRECTED BY PHYSICIAN. 75 mL 1  . aspirin 81 MG tablet Take 81 mg by mouth daily.    Marland Kitchen atorvastatin (LIPITOR) 40 MG tablet TAKE (1) TABLET BY MOUTH EVERY DAY 90 tablet 0  . fexofenadine (ALLEGRA) 180 MG tablet Take 1 tablet (180 mg total) by mouth daily. 90 tablet 3  . finasteride (PROSCAR) 5 MG tablet Take 1 tablet (5 mg total) daily by mouth. (Patient taking differently: Take 5 mg by mouth daily. Shannon Mcgowan) 90 tablet 4  . ibuprofen (ADVIL,MOTRIN) 800 MG  tablet TAKE (1) TABLET BY MOUTH THREE TIMES A DAY 270 tablet 0  . lisinopril-hydrochlorothiazide (PRINZIDE,ZESTORETIC) 10-12.5 MG tablet TAKE (1) TABLET BY MOUTH EVERY DAY 90 tablet 0  . montelukast (SINGULAIR) 10 MG tablet TAKE (1) TABLET BY MOUTH EVERY DAY 90 tablet 0  . umeclidinium-vilanterol (ANORO ELLIPTA) 62.5-25 MCG/INH AEPB Inhale 1 puff into the lungs daily. (Patient taking differently: Inhale 1 puff into the lungs daily. Dr Chancy Milroy) 1 each 4  . rosuvastatin (CRESTOR) 20 MG tablet Take 20 mg by mouth daily.     No facility-administered medications prior to visit.     Review of Systems  Respiratory: Positive for cough.      Objective  Vitals:   04/16/17 0815  BP: 140/86  Pulse: 80  Temp: 98.7 F (37.1 C)  TempSrc: Oral  SpO2: 95%  Weight: 183 lb (83 kg)  Height: 5\' 10"  (1.778 m)    Physical Exam    Assessment & Plan  Problem List Items Addressed This Visit    None      No orders of the defined types were placed in this  encounter.     Dr. Macon [facility]  04/16/17

## 2017-04-20 ENCOUNTER — Encounter (INDEPENDENT_AMBULATORY_CARE_PROVIDER_SITE_OTHER): Payer: Medicare Other

## 2017-04-20 ENCOUNTER — Ambulatory Visit (INDEPENDENT_AMBULATORY_CARE_PROVIDER_SITE_OTHER): Payer: Medicare Other | Admitting: Vascular Surgery

## 2017-04-22 NOTE — Progress Notes (Signed)
95 percentile pressure 11   95th percentile leak 7.2   apnea index 0.4 /hr  apnea-hypopnea index  1.0 /hr   total days used  >4 hr 30 days  total days used <4 hr 0 days  Total compliance 100 percent  Pt machine stopped reading on Jan 15, 2017. He will now have to bring the card in for the readings.

## 2017-04-29 ENCOUNTER — Ambulatory Visit: Payer: Medicare PPO | Admitting: Internal Medicine

## 2017-04-29 DIAGNOSIS — R0602 Shortness of breath: Secondary | ICD-10-CM | POA: Diagnosis not present

## 2017-04-30 ENCOUNTER — Ambulatory Visit (INDEPENDENT_AMBULATORY_CARE_PROVIDER_SITE_OTHER): Payer: Medicare PPO

## 2017-04-30 DIAGNOSIS — I6523 Occlusion and stenosis of bilateral carotid arteries: Secondary | ICD-10-CM | POA: Diagnosis not present

## 2017-05-14 ENCOUNTER — Encounter: Payer: Self-pay | Admitting: Internal Medicine

## 2017-05-14 NOTE — Patient Instructions (Signed)

## 2017-05-14 NOTE — Progress Notes (Signed)
[place], [address]  DATE OF SERVICE:  April 29, 2017  Complete Pulmonary Function Testing Interpretation:  FINDINGS:   forced vital capacity is mildly decreased the FEV1 is 2.05 L which is 65% predicted and is mildly decreased.  FEV1 FVC ratio is mildly decreased.  Total lung capacity is within normal limits by body box.  Residual volume is normal residual volume total lung capacity ratio is increased FRC is increased.  DLCO is within normal limits.  Post bronchodilator there is no significant improvement in the FEV1 however clinical improvement may occur in the absence of spirometric improvement and does not preclude the use of bronchodilators  IMPRESSION:  .  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  There is no significant improvement in bronchodilator performance however clinical improvement may currently absence of spirometric improvement and does not preclude the use of bronchodilators  Allyne Gee, MD [facility] Pulmonary Critical Care Medicine Sleep Medicine

## 2017-05-19 ENCOUNTER — Other Ambulatory Visit: Payer: Self-pay | Admitting: Family Medicine

## 2017-05-19 DIAGNOSIS — I10 Essential (primary) hypertension: Secondary | ICD-10-CM

## 2017-05-19 DIAGNOSIS — M5136 Other intervertebral disc degeneration, lumbar region: Secondary | ICD-10-CM

## 2017-05-19 DIAGNOSIS — M51369 Other intervertebral disc degeneration, lumbar region without mention of lumbar back pain or lower extremity pain: Secondary | ICD-10-CM

## 2017-06-03 DIAGNOSIS — L218 Other seborrheic dermatitis: Secondary | ICD-10-CM | POA: Diagnosis not present

## 2017-06-03 DIAGNOSIS — Z85828 Personal history of other malignant neoplasm of skin: Secondary | ICD-10-CM | POA: Diagnosis not present

## 2017-06-03 DIAGNOSIS — Z872 Personal history of diseases of the skin and subcutaneous tissue: Secondary | ICD-10-CM | POA: Diagnosis not present

## 2017-06-03 DIAGNOSIS — L570 Actinic keratosis: Secondary | ICD-10-CM | POA: Diagnosis not present

## 2017-06-03 DIAGNOSIS — L578 Other skin changes due to chronic exposure to nonionizing radiation: Secondary | ICD-10-CM | POA: Diagnosis not present

## 2017-06-13 DIAGNOSIS — J449 Chronic obstructive pulmonary disease, unspecified: Secondary | ICD-10-CM | POA: Diagnosis not present

## 2017-06-13 DIAGNOSIS — N400 Enlarged prostate without lower urinary tract symptoms: Secondary | ICD-10-CM | POA: Diagnosis not present

## 2017-06-13 DIAGNOSIS — I10 Essential (primary) hypertension: Secondary | ICD-10-CM | POA: Diagnosis not present

## 2017-06-13 DIAGNOSIS — Z8673 Personal history of transient ischemic attack (TIA), and cerebral infarction without residual deficits: Secondary | ICD-10-CM | POA: Diagnosis not present

## 2017-06-13 DIAGNOSIS — E785 Hyperlipidemia, unspecified: Secondary | ICD-10-CM | POA: Diagnosis not present

## 2017-06-13 DIAGNOSIS — M545 Low back pain: Secondary | ICD-10-CM | POA: Diagnosis not present

## 2017-06-13 DIAGNOSIS — H269 Unspecified cataract: Secondary | ICD-10-CM | POA: Diagnosis not present

## 2017-06-13 DIAGNOSIS — Z6826 Body mass index (BMI) 26.0-26.9, adult: Secondary | ICD-10-CM | POA: Diagnosis not present

## 2017-06-13 DIAGNOSIS — E663 Overweight: Secondary | ICD-10-CM | POA: Diagnosis not present

## 2017-06-16 DIAGNOSIS — G4733 Obstructive sleep apnea (adult) (pediatric): Secondary | ICD-10-CM | POA: Diagnosis not present

## 2017-07-07 ENCOUNTER — Encounter: Payer: Self-pay | Admitting: Family Medicine

## 2017-07-07 ENCOUNTER — Ambulatory Visit: Payer: Medicare PPO | Admitting: Family Medicine

## 2017-07-07 VITALS — BP 130/74 | HR 80 | Ht 70.0 in | Wt 181.0 lb

## 2017-07-07 DIAGNOSIS — E7849 Other hyperlipidemia: Secondary | ICD-10-CM | POA: Diagnosis not present

## 2017-07-07 DIAGNOSIS — J301 Allergic rhinitis due to pollen: Secondary | ICD-10-CM

## 2017-07-07 DIAGNOSIS — M7542 Impingement syndrome of left shoulder: Secondary | ICD-10-CM | POA: Diagnosis not present

## 2017-07-07 DIAGNOSIS — I10 Essential (primary) hypertension: Secondary | ICD-10-CM | POA: Diagnosis not present

## 2017-07-07 DIAGNOSIS — J441 Chronic obstructive pulmonary disease with (acute) exacerbation: Secondary | ICD-10-CM

## 2017-07-07 DIAGNOSIS — R69 Illness, unspecified: Secondary | ICD-10-CM

## 2017-07-07 DIAGNOSIS — J4550 Severe persistent asthma, uncomplicated: Secondary | ICD-10-CM | POA: Diagnosis not present

## 2017-07-07 MED ORDER — ALBUTEROL SULFATE HFA 108 (90 BASE) MCG/ACT IN AERS: 2.0000 | INHALATION_SPRAY | Freq: Four times a day (QID) | RESPIRATORY_TRACT | 11 refills | Status: DC | PRN

## 2017-07-07 MED ORDER — LISINOPRIL-HYDROCHLOROTHIAZIDE 10-12.5 MG PO TABS: ORAL_TABLET | ORAL | 1 refills | Status: DC

## 2017-07-07 MED ORDER — ATORVASTATIN CALCIUM 40 MG PO TABS: ORAL_TABLET | ORAL | 1 refills | Status: DC

## 2017-07-07 MED ORDER — IPRATROPIUM-ALBUTEROL 0.5-2.5 (3) MG/3ML IN SOLN: 3.0000 mL | Freq: Four times a day (QID) | RESPIRATORY_TRACT | 1 refills | Status: DC | PRN

## 2017-07-07 MED ORDER — MONTELUKAST SODIUM 10 MG PO TABS: ORAL_TABLET | ORAL | 1 refills | Status: DC

## 2017-07-07 NOTE — Progress Notes (Signed)
Name: [name]   MRN: [id number]    DOB: 19-Feb-1941   Date:07/07/2017       Progress Note  Subjective  Chief Complaint  Chief Complaint  Patient presents with  . COPD  . Hyperlipidemia  . Hypertension    COPD  He complains of chest tightness, cough, difficulty breathing, shortness of breath and wheezing. There is no frequent throat clearing, hemoptysis, hoarse voice or sputum production. This is a new problem. The current episode started more than 1 year ago. The cough is productive of sputum. Associated symptoms include dyspnea on exertion. Pertinent negatives include no chest pain, ear congestion, ear pain, fever, headaches, heartburn, malaise/fatigue, myalgias, nasal congestion, orthopnea, PND, postnasal drip, rhinorrhea, sneezing, sore throat, sweats, trouble swallowing or weight loss. His symptoms are aggravated by change in weather and pollen. His symptoms are alleviated by beta-agonist, ipratropium and steroid inhaler. He reports moderate improvement on treatment. His past medical history is significant for COPD.  Hyperlipidemia  This is a chronic problem. The current episode started more than 1 year ago. He has no history of chronic renal disease. Associated symptoms include shortness of breath. Pertinent negatives include no chest pain, focal weakness or myalgias. The current treatment provides mild improvement of lipids. There are no compliance problems.  Risk factors for coronary artery disease include dyslipidemia, hypertension, male sex and post-menopausal.  Hypertension  This is a chronic problem. The current episode started more than 1 year ago. The problem has been waxing and waning since onset. The problem is controlled. Associated symptoms include shortness of breath. Pertinent negatives include no blurred vision, chest pain, headaches, malaise/fatigue, neck pain, orthopnea, palpitations, peripheral edema, PND or sweats. Past treatments include ACE inhibitors and  diuretics. The current treatment provides moderate improvement. There are no compliance problems.  There is no history of angina, kidney disease, CAD/MI, CVA, heart failure, left ventricular hypertrophy, PVD or retinopathy. There is no history of chronic renal disease, a hypertension causing med or renovascular disease.  Shoulder Pain   The pain is present in the left shoulder. This is a new problem. The current episode started more than 1 month ago. There has been a history of trauma (fell out of truck). The problem has been waxing and waning. The quality of the pain is described as aching and sharp. The pain is at a severity of 5/10. The pain is moderate. Associated symptoms include a limited range of motion and stiffness. Pertinent negatives include no fever, inability to bear weight, numbness or tingling. Treatments tried: "some kind of cream" The treatment provided no relief.    No problem-specific Assessment & Plan notes found for this encounter.   Past Medical History:  Diagnosis Date  . Allergic rhinitis due to pollen   . Asthma   . BPH (benign prostatic hyperplasia)   . Cardiomegaly   . Chronic obstructive pulmonary disease (COPD) ([place])   . Hypercholesteremia   . Hypersomnia, unspecified   . Hypertension   . Obstructive sleep apnea (adult) (pediatric)   . Shortness of breath   . Snoring   . Solitary pulmonary nodule     Past Surgical History:  Procedure Laterality Date  . ARTERY REPAIR    . BACK SURGERY    . CHOLECYSTECTOMY    . COLONOSCOPY  2013   cleared  . HERNIA REPAIR      Family History  Problem Relation Age of Onset  . COPD Mother   . Asthma Son   . Cancer Father  lung  . Prostate cancer Neg Hx   . Kidney disease Neg Hx     Social History   Socioeconomic History  . Marital status: Married    Spouse name: Not on file  . Number of children: Not on file  . Years of education: Not on file  . Highest education level: Not on file  Occupational  History  . Not on file  Social Needs  . Financial resource strain: Not on file  . Food insecurity:    Worry: Not on file    Inability: Not on file  . Transportation needs:    Medical: Not on file    Non-medical: Not on file  Tobacco Use  . Smoking status: Former Smoker    Packs/day: 1.00    Years: 20.00    Pack years: 20.00    Types: Cigarettes    Last attempt to quit: 08/09/1983    Years since quitting: 33.9  . Smokeless tobacco: Never Used  Substance and Sexual Activity  . Alcohol use: Yes    Alcohol/week: 0.0 oz    Comment: once monthly  . Drug use: No  . Sexual activity: Not on file  Lifestyle  . Physical activity:    Days per week: Not on file    Minutes per session: Not on file  . Stress: Not on file  Relationships  . Social connections:    Talks on phone: Not on file    Gets together: Not on file    Attends religious service: Not on file    Active member of club or organization: Not on file    Attends meetings of clubs or organizations: Not on file    Relationship status: Not on file  . Intimate partner violence:    Fear of current or ex partner: Not on file    Emotionally abused: Not on file    Physically abused: Not on file    Forced sexual activity: Not on file  Other Topics Concern  . Not on file  Social History Narrative  . Not on file    Allergies  Allergen Reactions  . Codeine Itching    Outpatient Medications Prior to Visit  Medication Sig Dispense Refill  . albuterol (PROVENTIL) (2.5 MG/3ML) 0.083% nebulizer solution USE ONE VIAL BY NEBULIZER ONCE DAILY AS DIRECTED BY PHYSICIAN. 75 mL 1  . aspirin 81 MG tablet Take 81 mg by mouth daily.    . fexofenadine (ALLEGRA) 180 MG tablet Take 1 tablet (180 mg total) by mouth daily. 90 tablet 3  . finasteride (PROSCAR) 5 MG tablet Take 1 tablet (5 mg total) daily by mouth. (Patient taking differently: Take 5 mg by mouth daily. Marcus Delacruz) 90 tablet 4  . ibuprofen (ADVIL,MOTRIN) 800 MG tablet TAKE  (1) TABLET BY MOUTH THREE TIMES A DAY 270 tablet 0  . umeclidinium-vilanterol (ANORO ELLIPTA) 62.5-25 MCG/INH AEPB Inhale 1 puff into the lungs daily. (Patient taking differently: Inhale 1 puff into the lungs daily. Dr Chancy Milroy) 1 each 4  . albuterol (PROVENTIL HFA;VENTOLIN HFA) 108 (90 Base) MCG/ACT inhaler Inhale 2 puffs into the lungs every 6 (six) hours as needed for wheezing or shortness of breath. 1 Inhaler 11  . atorvastatin (LIPITOR) 40 MG tablet TAKE (1) TABLET BY MOUTH EVERY DAY 90 tablet 0  . lisinopril-hydrochlorothiazide (PRINZIDE,ZESTORETIC) 10-12.5 MG tablet TAKE (1) TABLET BY MOUTH EVERY DAY 90 tablet 0  . montelukast (SINGULAIR) 10 MG tablet TAKE (1) TABLET BY MOUTH EVERY DAY 90 tablet 0  .  azithromycin (ZITHROMAX) 250 MG tablet 2 today then 1 a day for 4 days 6 tablet 0  . guaiFENesin-codeine (ROBITUSSIN AC) 100-10 MG/5ML syrup Take 5 mLs by mouth 3 (three) times daily as needed for cough. 150 mL 0   Facility-Administered Medications Prior to Visit  Medication Dose Route Frequency Provider Last Rate Last Dose  . ipratropium-albuterol (DUONEB) 0.5-2.5 (3) MG/3ML nebulizer solution 3 mL  3 mL Nebulization Once Juline Patch, MD        Review of Systems  Constitutional: Negative for chills, fever, malaise/fatigue and weight loss.  HENT: Negative for ear discharge, ear pain, hoarse voice, postnasal drip, rhinorrhea, sneezing, sore throat and trouble swallowing.   Eyes: Negative for blurred vision.  Respiratory: Positive for cough, shortness of breath and wheezing. Negative for hemoptysis and sputum production.   Cardiovascular: Positive for dyspnea on exertion. Negative for chest pain, palpitations, orthopnea, leg swelling and PND.  Gastrointestinal: Negative for abdominal pain, blood in stool, constipation, diarrhea, heartburn, melena and nausea.  Genitourinary: Negative for dysuria, frequency, hematuria and urgency.  Musculoskeletal: Positive for stiffness. Negative for back pain,  joint pain, myalgias and neck pain.  Skin: Negative for rash.  Neurological: Negative for dizziness, tingling, sensory change, focal weakness, numbness and headaches.  Endo/Heme/Allergies: Negative for environmental allergies and polydipsia. Does not bruise/bleed easily.  Psychiatric/Behavioral: Negative for depression and suicidal ideas. The patient is not nervous/anxious and does not have insomnia.      Objective  Vitals:   07/07/17 0813  BP: 130/74  Pulse: 80  SpO2: 98%  Weight: 181 lb (82.1 kg)  Height: 5\' 10"  (1.778 m)    Physical Exam  Constitutional: He is oriented to person, place, and time. He appears well-nourished.  HENT:  Head: Normocephalic.  Right Ear: External ear normal.  Left Ear: External ear normal.  Nose: Nose normal.  Mouth/Throat: Oropharynx is clear and moist.  Eyes: Pupils are equal, round, and reactive to light. Conjunctivae and EOM are normal. Right eye exhibits no discharge. Left eye exhibits no discharge. No scleral icterus.  Neck: Normal range of motion. Neck supple. No JVD present. No tracheal deviation present. No thyromegaly present.  Cardiovascular: Normal rate, regular rhythm, normal heart sounds and intact distal pulses. Exam reveals no gallop and no friction rub.  No murmur heard. Pulmonary/Chest: No stridor. No respiratory distress. He has wheezes. He has no rales. He exhibits no tenderness.  Abdominal: Soft. Bowel sounds are normal. He exhibits no mass. There is no hepatosplenomegaly. There is no tenderness. There is no rebound, no guarding and no CVA tenderness.  Musculoskeletal: He exhibits no edema.       Left shoulder: He exhibits decreased range of motion, tenderness and pain.  S/p fall tender supraspinatis  Lymphadenopathy:    He has no cervical adenopathy.  Neurological: He is alert and oriented to person, place, and time. He has normal strength and normal reflexes. No cranial nerve deficit.  Skin: Skin is warm. No rash noted.   Nursing note and vitals reviewed.     Assessment & Plan  Problem List Items Addressed This Visit      Cardiovascular and Mediastinum   Essential (primary) hypertension   Relevant Medications   lisinopril-hydrochlorothiazide (PRINZIDE,ZESTORETIC) 10-12.5 MG tablet   atorvastatin (LIPITOR) 40 MG tablet   Other Relevant Orders   Renal Function Panel     Respiratory   Asthma, chronic, severe persistent, uncomplicated   Relevant Medications   albuterol (PROVENTIL HFA;VENTOLIN HFA) 108 (90 Base) MCG/ACT inhaler  montelukast (SINGULAIR) 10 MG tablet   ipratropium-albuterol (DUONEB) 0.5-2.5 (3) MG/3ML SOLN   Seasonal allergic rhinitis due to pollen   Relevant Medications   montelukast (SINGULAIR) 10 MG tablet     Other   Familial multiple lipoprotein-type hyperlipidemia   Relevant Medications   lisinopril-hydrochlorothiazide (PRINZIDE,ZESTORETIC) 10-12.5 MG tablet   atorvastatin (LIPITOR) 40 MG tablet   Other Relevant Orders   Lipid Panel With LDL/HDL Ratio    Other Visit Diagnoses    COPD with acute exacerbation ([place])    -  Primary   significant improvement with duobneb   Relevant Medications   albuterol (PROVENTIL HFA;VENTOLIN HFA) 108 (90 Base) MCG/ACT inhaler   montelukast (SINGULAIR) 10 MG tablet   ipratropium-albuterol (DUONEB) 0.5-2.5 (3) MG/3ML SOLN   Impingement syndrome of left shoulder       Relevant Orders   Ambulatory referral to Orthopedic Surgery   Taking medication for chronic disease       Relevant Orders   Hepatic function panel      Meds ordered this encounter  Medications  . albuterol (PROVENTIL HFA;VENTOLIN HFA) 108 (90 Base) MCG/ACT inhaler    Sig: Inhale 2 puffs into the lungs every 6 (six) hours as needed for wheezing or shortness of breath.    Dispense:  1 Inhaler    Refill:  11  . montelukast (SINGULAIR) 10 MG tablet    Sig: TAKE (1) TABLET BY MOUTH EVERY DAY    Dispense:  90 tablet    Refill:  1    Call and sched appt  .  lisinopril-hydrochlorothiazide (PRINZIDE,ZESTORETIC) 10-12.5 MG tablet    Sig: 1 a day    Dispense:  90 tablet    Refill:  1  . atorvastatin (LIPITOR) 40 MG tablet    Sig: TAKE (1) TABLET BY MOUTH EVERY DAY    Dispense:  90 tablet    Refill:  1  . ipratropium-albuterol (DUONEB) 0.5-2.5 (3) MG/3ML SOLN    Sig: Take 3 mLs by nebulization every 6 (six) hours as needed.    Dispense:  360 mL    Refill:  1      Dr. Mac[facility]  07/07/17

## 2017-07-09 ENCOUNTER — Other Ambulatory Visit: Payer: Self-pay

## 2017-07-09 ENCOUNTER — Telehealth: Payer: Self-pay

## 2017-07-09 ENCOUNTER — Other Ambulatory Visit: Payer: Self-pay | Admitting: Orthopedic Surgery

## 2017-07-09 DIAGNOSIS — I10 Essential (primary) hypertension: Secondary | ICD-10-CM | POA: Diagnosis not present

## 2017-07-09 DIAGNOSIS — S46002A Unspecified injury of muscle(s) and tendon(s) of the rotator cuff of left shoulder, initial encounter: Secondary | ICD-10-CM | POA: Diagnosis not present

## 2017-07-09 DIAGNOSIS — J441 Chronic obstructive pulmonary disease with (acute) exacerbation: Secondary | ICD-10-CM

## 2017-07-09 DIAGNOSIS — M25512 Pain in left shoulder: Secondary | ICD-10-CM

## 2017-07-09 DIAGNOSIS — G8929 Other chronic pain: Secondary | ICD-10-CM | POA: Diagnosis not present

## 2017-07-09 DIAGNOSIS — R69 Illness, unspecified: Secondary | ICD-10-CM | POA: Diagnosis not present

## 2017-07-09 DIAGNOSIS — E7849 Other hyperlipidemia: Secondary | ICD-10-CM | POA: Diagnosis not present

## 2017-07-09 DIAGNOSIS — W010XXA Fall on same level from slipping, tripping and stumbling without subsequent striking against object, initial encounter: Secondary | ICD-10-CM | POA: Diagnosis not present

## 2017-07-09 DIAGNOSIS — M25312 Other instability, left shoulder: Secondary | ICD-10-CM | POA: Diagnosis not present

## 2017-07-09 MED ORDER — PREDNISONE 10 MG PO TABS: 10.0000 mg | ORAL_TABLET | Freq: Every day | ORAL | 0 refills | Status: DC

## 2017-07-09 NOTE — Telephone Encounter (Signed)
Called with c/o wheezing waking him up, not getting any better. Add prednisone taper 4,4,4,3,3,3,2,2,2,1,1,1- sent to [facility]

## 2017-07-09 NOTE — Telephone Encounter (Signed)
Pt called Marcus Delacruz asking about his AWV on 08/19/17 and whether or not he should keep this appt since a nurse with his ins co completed something similar. Returned pt call as requested to provide him with an explanation about his AWV on 08/19/17 and the importance of keeping this appt. LVM requesting returned call.

## 2017-07-10 LAB — LIPID PANEL WITH LDL/HDL RATIO
CHOLESTEROL TOTAL: 151 mg/dL (ref 100–199)
HDL: 52 mg/dL (ref >39)
LDL Calculated: 78 mg/dL (ref 0–99)
LDl/HDL Ratio: 1.5 ratio (ref 0.0–3.6)
TRIGLYCERIDES: 104 mg/dL (ref 0–149)
VLDL CHOLESTEROL CAL: 21 mg/dL (ref 5–40)

## 2017-07-10 LAB — RENAL FUNCTION PANEL
Albumin: 4.5 g/dL (ref 3.5–4.8)
BUN / CREAT RATIO: 11 (ref 10–24)
BUN: 10 mg/dL (ref 8–27)
CALCIUM: 9.3 mg/dL (ref 8.6–10.2)
CHLORIDE: 100 mmol/L (ref 96–106)
CO2: 23 mmol/L (ref 20–29)
Creatinine, Ser: 0.95 mg/dL (ref 0.76–1.27)
GFR calc Af Amer: 90 mL/min/{1.73_m2} (ref >59)
GFR calc non Af Amer: 78 mL/min/{1.73_m2} (ref >59)
GLUCOSE: 73 mg/dL (ref 65–99)
Phosphorus: 2.9 mg/dL (ref 2.5–4.5)
Potassium: 4.4 mmol/L (ref 3.5–5.2)
SODIUM: 140 mmol/L (ref 134–144)

## 2017-07-10 LAB — HEPATIC FUNCTION PANEL
ALK PHOS: 83 IU/L (ref 39–117)
ALT: 25 IU/L (ref 0–44)
AST: 51 IU/L — AB (ref 0–40)
Bilirubin Total: 0.5 mg/dL (ref 0.0–1.2)
Bilirubin, Direct: 0.14 mg/dL (ref 0.00–0.40)
TOTAL PROTEIN: 7.3 g/dL (ref 6.0–8.5)

## 2017-07-21 ENCOUNTER — Ambulatory Visit
Admission: RE | Admit: 2017-07-21 | Discharge: 2017-07-21 | Disposition: A | Discharge status: Home / Self Care | Payer: Medicare PPO | Source: Ambulatory Visit | Attending: Orthopedic Surgery | Admitting: Orthopedic Surgery

## 2017-07-21 DIAGNOSIS — M7552 Bursitis of left shoulder: Secondary | ICD-10-CM | POA: Insufficient documentation

## 2017-07-21 DIAGNOSIS — M25512 Pain in left shoulder: Secondary | ICD-10-CM | POA: Diagnosis not present

## 2017-07-21 DIAGNOSIS — S4992XA Unspecified injury of left shoulder and upper arm, initial encounter: Secondary | ICD-10-CM | POA: Diagnosis not present

## 2017-07-21 DIAGNOSIS — M25312 Other instability, left shoulder: Secondary | ICD-10-CM

## 2017-07-21 DIAGNOSIS — S46002A Unspecified injury of muscle(s) and tendon(s) of the rotator cuff of left shoulder, initial encounter: Secondary | ICD-10-CM

## 2017-07-27 ENCOUNTER — Ambulatory Visit: Payer: Medicare PPO | Admitting: Family Medicine

## 2017-07-27 ENCOUNTER — Ambulatory Visit
Admission: RE | Admit: 2017-07-27 | Discharge: 2017-07-27 | Disposition: A | Discharge status: Home / Self Care | Payer: Medicare PPO | Source: Ambulatory Visit | Attending: Family Medicine | Admitting: Family Medicine

## 2017-07-27 ENCOUNTER — Encounter: Payer: Self-pay | Admitting: Family Medicine

## 2017-07-27 VITALS — BP 124/80 | HR 80 | Ht 70.0 in | Wt 179.0 lb

## 2017-07-27 DIAGNOSIS — J40 Bronchitis, not specified as acute or chronic: Secondary | ICD-10-CM | POA: Diagnosis not present

## 2017-07-27 DIAGNOSIS — J441 Chronic obstructive pulmonary disease with (acute) exacerbation: Secondary | ICD-10-CM | POA: Insufficient documentation

## 2017-07-27 DIAGNOSIS — J449 Chronic obstructive pulmonary disease, unspecified: Secondary | ICD-10-CM | POA: Insufficient documentation

## 2017-07-27 DIAGNOSIS — R05 Cough: Secondary | ICD-10-CM | POA: Diagnosis not present

## 2017-07-27 DIAGNOSIS — J4541 Moderate persistent asthma with (acute) exacerbation: Secondary | ICD-10-CM

## 2017-07-27 DIAGNOSIS — R0602 Shortness of breath: Secondary | ICD-10-CM | POA: Diagnosis not present

## 2017-07-27 MED ORDER — EPINEPHRINE 0.3 MG/0.3ML IJ SOAJ: 0.3000 mg | Freq: Once | INTRAMUSCULAR | 1 refills | Status: AC

## 2017-07-27 MED ORDER — PREDNISONE 10 MG PO TABS: ORAL_TABLET | ORAL | 1 refills | Status: DC

## 2017-07-27 MED ORDER — AZITHROMYCIN 250 MG PO TABS: ORAL_TABLET | ORAL | 0 refills | Status: DC

## 2017-07-27 MED ORDER — IPRATROPIUM-ALBUTEROL 0.5-2.5 (3) MG/3ML IN SOLN: 3.0000 mL | Freq: Once | RESPIRATORY_TRACT | Status: DC

## 2017-07-27 NOTE — Patient Instructions (Addendum)
Epinephrine injection (Auto-injector) What is this medicine? EPINEPHRINE (ep i NEF rin) is used for the emergency treatment of severe allergic reactions. You should keep this medicine with you at all times. This medicine may be used for other purposes; ask your health care provider or pharmacist if you have questions. COMMON BRAND NAME(S): Adrenaclick, Auvi-Q, epinephrinesnap, epinephrinesnap-v, EpiPen, EPIsnap Epinephrine, SYMJEPI, Twinject What should I tell my health care provider before I take this medicine? They need to know if you have any of the following conditions: -diabetes -heart disease -high blood pressure -lung or breathing disease, like asthma -Parkinson's disease -thyroid disease -an unusual or allergic reaction to epinephrine, sulfites, other medicines, foods, dyes, or preservatives -pregnant or trying to get pregnant -breast-feeding How should I use this medicine? This medicine is for injection into the outer thigh. Your doctor or health care professional will instruct you on the proper use of the device during an emergency. Read all directions carefully and make sure you understand them. Do not use more often than directed. Talk to your pediatrician regarding the use of this medicine in children. Special care may be needed. This drug is commonly used in children. A special device is available for use in children. If you are giving this medicine to a young child, hold their leg firmly in place before and during the injection to prevent injury. Overdosage: If you think you have taken too much of this medicine contact a poison control center or emergency room at once. NOTE: This medicine is only for you. Do not share this medicine with others. What if I miss a dose? This does not apply. You should only use this medicine for an allergic reaction. What may interact with this medicine? This medicine is only used during an emergency. Significant drug interactions are not likely  during emergency use. This list may not describe all possible interactions. Give your health care provider a list of all the medicines, herbs, non-prescription drugs, or dietary supplements you use. Also tell them if you smoke, drink alcohol, or use illegal drugs. Some items may interact with your medicine. What should I watch for while using this medicine? Keep this medicine ready for use in the case of a severe allergic reaction. Make sure that you have the phone number of your doctor or health care professional and local hospital ready. Remember to check the expiration date of your medicine regularly. You may need to have additional units of this medicine with you at work, school, or other places. Talk to your doctor or health care professional about your need for extra units. Some emergencies may require an additional dose. Check with your doctor or a health care professional before using an extra dose. After use, go to the nearest hospital or call 911. Avoid physical activity. Make sure the treating health care professional knows you have received an injection of this medicine. You will receive additional instructions on what to do during and after use of this medicine before a medical emergency occurs. What side effects may I notice from receiving this medicine? Side effects that you should report to your doctor or health care professional as soon as possible: -allergic reactions like skin rash, itching or hives, swelling of the face, lips, or tongue -breathing problems -chest pain -fast, irregular heartbeat -pain, tingling, numbness in the hands or feet -pain, redness, or irritation at site where injected -vomiting Side effects that usually do not require medical attention (report to your doctor or health care professional if they continue  or are bothersome): -anxious -dizziness -dry mouth -headache -increased sweating -nausea -unusually weak or tired This list may not describe all  possible side effects. Call your doctor for medical advice about side effects. You may report side effects to FDA at 1-800-FDA-1088. Where should I keep my medicine? Keep out of the reach of children. Store at room temperature between 15 and 30 degrees C (59 and 86 degrees F). Protect from light and heat. The solution should be clear in color. If the solution is discolored or contains particles it must be replaced. Throw away any unused medicine after the expiration date. Ask your doctor or pharmacist about proper disposal of the injector if it is expired or has been used. Always replace your auto-injector before it expires. NOTE: This sheet is a summary. It may not cover all possible information. If you have questions about this medicine, talk to your doctor, pharmacist, or health care provider.  2018 [employer]vier/Gold Standard (2014-07-31 12:24:50)

## 2017-07-27 NOTE — Progress Notes (Signed)
Name: Marcus Delacruz   MRN: [id number]    DOB: 1940-03-16   Date:07/27/2017       Progress Note  Subjective  Chief Complaint  Chief Complaint  Patient presents with  . Asthma    finished pred x 7 days ago- taking Duoneb 4 times qday and inhalers.    Asthma  He complains of chest tightness, difficulty breathing, frequent throat clearing, hoarse voice, shortness of breath and wheezing. There is no cough, hemoptysis or sputum production. This is a chronic (acute episode/ 3 wksago grad improve on po steroids/duaneb /worsen over past 72 hrs) problem. The current episode started more than 1 year ago. The problem occurs constantly. The problem has been gradually worsening. Pertinent negatives include no chest pain, ear pain, fever, headaches, heartburn, malaise/fatigue, myalgias, sore throat or weight loss. His symptoms are aggravated by pollen and change in weather. His symptoms are alleviated by beta-agonist, leukotriene antagonist, ipratropium and oral steroids. He reports moderate improvement on treatment. His past medical history is significant for asthma. There is no history of bronchiectasis, bronchitis, COPD, emphysema or pneumonia.    No problem-specific Assessment & Plan notes found for this encounter.   Past Medical History:  Diagnosis Date  . Allergic rhinitis due to pollen   . Asthma   . BPH (benign prostatic hyperplasia)   . Cardiomegaly   . Chronic obstructive pulmonary disease (COPD) ([place])   . Hypercholesteremia   . Hypersomnia, unspecified   . Hypertension   . Obstructive sleep apnea (adult) (pediatric)   . Shortness of breath   . Snoring   . Solitary pulmonary nodule     Past Surgical History:  Procedure Laterality Date  . ARTERY REPAIR    . BACK SURGERY    . CHOLECYSTECTOMY    . COLONOSCOPY  2013   cleared  . HERNIA REPAIR      Family History  Problem Relation Age of Onset  . COPD Mother   . Asthma Son   . Cancer Father        lung  . Prostate  cancer Neg Hx   . Kidney disease Neg Hx     Social History   Socioeconomic History  . Marital status: Married    Spouse name: Not on file  . Number of children: Not on file  . Years of education: Not on file  . Highest education level: Not on file  Occupational History  . Not on file  Social Needs  . Financial resource strain: Not on file  . Food insecurity:    Worry: Not on file    Inability: Not on file  . Transportation needs:    Medical: Not on file    Non-medical: Not on file  Tobacco Use  . Smoking status: Former Smoker    Packs/day: 1.00    Years: 20.00    Pack years: 20.00    Types: Cigarettes    Last attempt to quit: 08/09/1983    Years since quitting: 33.9  . Smokeless tobacco: Never Used  Substance and Sexual Activity  . Alcohol use: Yes    Alcohol/week: 0.0 oz    Comment: once monthly  . Drug use: No  . Sexual activity: Not on file  Lifestyle  . Physical activity:    Days per week: Not on file    Minutes per session: Not on file  . Stress: Not on file  Relationships  . Social connections:    Talks on phone: Not on file  Gets together: Not on file    Attends religious service: Not on file    Active member of club or organization: Not on file    Attends meetings of clubs or organizations: Not on file    Relationship status: Not on file  . Intimate partner violence:    Fear of current or ex partner: Not on file    Emotionally abused: Not on file    Physically abused: Not on file    Forced sexual activity: Not on file  Other Topics Concern  . Not on file  Social History Narrative  . Not on file    Allergies  Allergen Reactions  . Codeine Itching    Outpatient Medications Prior to Visit  Medication Sig Dispense Refill  . albuterol (PROVENTIL HFA;VENTOLIN HFA) 108 (90 Base) MCG/ACT inhaler Inhale 2 puffs into the lungs every 6 (six) hours as needed for wheezing or shortness of breath. 1 Inhaler 11  . aspirin 81 MG tablet Take 81 mg by mouth  daily.    Marland Kitchen atorvastatin (LIPITOR) 40 MG tablet TAKE (1) TABLET BY MOUTH EVERY DAY 90 tablet 1  . fexofenadine (ALLEGRA) 180 MG tablet Take 1 tablet (180 mg total) by mouth daily. 90 tablet 3  . finasteride (PROSCAR) 5 MG tablet Take 1 tablet (5 mg total) daily by mouth. (Patient taking differently: Take 5 mg by mouth daily. Shannon Mcgowan) 90 tablet 4  . ibuprofen (ADVIL,MOTRIN) 800 MG tablet TAKE (1) TABLET BY MOUTH THREE TIMES A DAY 270 tablet 0  . ipratropium-albuterol (DUONEB) 0.5-2.5 (3) MG/3ML SOLN Take 3 mLs by nebulization every 6 (six) hours as needed. 360 mL 1  . lisinopril-hydrochlorothiazide (PRINZIDE,ZESTORETIC) 10-12.5 MG tablet 1 a day 90 tablet 1  . montelukast (SINGULAIR) 10 MG tablet TAKE (1) TABLET BY MOUTH EVERY DAY 90 tablet 1  . umeclidinium-vilanterol (ANORO ELLIPTA) 62.5-25 MCG/INH AEPB Inhale 1 puff into the lungs daily. (Patient taking differently: Inhale 1 puff into the lungs daily. Dr Chancy Milroy) 1 each 4  . albuterol (PROVENTIL) (2.5 MG/3ML) 0.083% nebulizer solution USE ONE VIAL BY NEBULIZER ONCE DAILY AS DIRECTED BY PHYSICIAN. (Patient not taking: Reported on 07/27/2017) 75 mL 1  . predniSONE (DELTASONE) 10 MG tablet Take 1 tablet (10 mg total) by mouth daily with breakfast. 30 tablet 0   Facility-Administered Medications Prior to Visit  Medication Dose Route Frequency Provider Last Rate Last Dose  . ipratropium-albuterol (DUONEB) 0.5-2.5 (3) MG/3ML nebulizer solution 3 mL  3 mL Nebulization Once Juline Patch, MD        Review of Systems  Constitutional: Negative for chills, fever, malaise/fatigue and weight loss.  HENT: Positive for hoarse voice. Negative for ear discharge, ear pain and sore throat.   Eyes: Negative for blurred vision.  Respiratory: Positive for shortness of breath and wheezing. Negative for cough, hemoptysis and sputum production.   Cardiovascular: Negative for chest pain, palpitations and leg swelling.  Gastrointestinal: Negative for abdominal  pain, blood in stool, constipation, diarrhea, heartburn, melena and nausea.  Genitourinary: Negative for dysuria, frequency, hematuria and urgency.  Musculoskeletal: Negative for back pain, joint pain, myalgias and neck pain.  Skin: Negative for rash.  Neurological: Negative for dizziness, tingling, sensory change, focal weakness and headaches.  Endo/Heme/Allergies: Negative for environmental allergies and polydipsia. Does not bruise/bleed easily.  Psychiatric/Behavioral: Negative for depression and suicidal ideas. The patient is not nervous/anxious and does not have insomnia.      Objective  Vitals:   07/27/17 1030  BP: 124/80  Pulse: 80  SpO2: 94%  Weight: 179 lb (81.2 kg)  Height: 5\' 10"  (1.778 m)    Physical Exam  Constitutional: He is oriented to person, place, and time.  HENT:  Head: Normocephalic.  Right Ear: External ear normal.  Left Ear: External ear normal.  Nose: Nose normal.  Mouth/Throat: Oropharynx is clear and moist.  Eyes: Pupils are equal, round, and reactive to light. Conjunctivae and EOM are normal. Right eye exhibits no discharge. Left eye exhibits no discharge. No scleral icterus.  Neck: Normal range of motion. Neck supple. No JVD present. No tracheal deviation present. No thyromegaly present.  Cardiovascular: Normal rate, regular rhythm, normal heart sounds and intact distal pulses. Exam reveals no gallop and no friction rub.  No murmur heard. Pulmonary/Chest: Stridor present. No respiratory distress. He has wheezes. He has no rales. He exhibits no tenderness.  Abdominal: Soft. Bowel sounds are normal. He exhibits no mass. There is no hepatosplenomegaly. There is no tenderness. There is no rebound, no guarding and no CVA tenderness.  Musculoskeletal: Normal range of motion. He exhibits no edema or tenderness.  Lymphadenopathy:    He has no cervical adenopathy.  Neurological: He is alert and oriented to person, place, and time. He has normal strength and  normal reflexes. No cranial nerve deficit.  Skin: Skin is warm. No rash noted.  Nursing note and vitals reviewed.     Assessment & Plan  Problem List Items Addressed This Visit      Respiratory   Asthma with exacerbation - Primary   Relevant Medications   predniSONE (DELTASONE) 10 MG tablet   EPINEPHrine 0.3 mg/0.3 mL IJ SOAJ injection   ipratropium-albuterol (DUONEB) 0.5-2.5 (3) MG/3ML nebulizer solution 3 mL   Chronic obstructive pulmonary disease with acute exacerbation (HCC)   Relevant Medications   predniSONE (DELTASONE) 10 MG tablet   azithromycin (ZITHROMAX) 250 MG tablet   ipratropium-albuterol (DUONEB) 0.5-2.5 (3) MG/3ML nebulizer solution 3 mL   Other Relevant Orders   DG Chest 2 View    Other Visit Diagnoses    Bronchitis       cough pesistent for 3 weeks/azithromycin initiated   Relevant Medications   predniSONE (DELTASONE) 10 MG tablet   azithromycin (ZITHROMAX) 250 MG tablet   Other Relevant Orders   DG Chest 2 View      Meds ordered this encounter  Medications  . predniSONE (DELTASONE) 10 MG tablet    Sig: Taper 6,6,6,5,5,5,4,4,3,3,2,2,1,1    Dispense:  60 tablet    Refill:  1  . azithromycin (ZITHROMAX) 250 MG tablet    Sig: 2 today then 1 a day for 4 days    Dispense:  6 tablet    Refill:  0  . EPINEPHrine 0.3 mg/0.3 mL IJ SOAJ injection    Sig: Inject 0.3 mLs (0.3 mg total) into the muscle once for 1 dose.    Dispense:  1 Device    Refill:  1  . ipratropium-albuterol (DUONEB) 0.5-2.5 (3) MG/3ML nebulizer solution 3 mL  Patient underwent nebulization with 0.5/2.5/3 ml  duoneb for persistent asthma/acute exacerbation. Pretreatment 94% to 95% after treatment. Auscultration noted improved ventilation after treament. Sample trelegy/exacerbation after pred/ran out/ for copd sample trelegy/ given /to followup with Dr Humphrey Rolls on Thursday.   Dr. [facility]  07/27/17

## 2017-07-29 ENCOUNTER — Ambulatory Visit: Payer: Medicare PPO | Admitting: Family Medicine

## 2017-07-30 ENCOUNTER — Ambulatory Visit: Payer: Medicare PPO | Admitting: Internal Medicine

## 2017-07-30 ENCOUNTER — Encounter: Payer: Self-pay | Admitting: Internal Medicine

## 2017-07-30 VITALS — BP 130/70 | HR 81 | Resp 16 | Ht 70.0 in | Wt 183.6 lb

## 2017-07-30 DIAGNOSIS — G4733 Obstructive sleep apnea (adult) (pediatric): Secondary | ICD-10-CM

## 2017-07-30 DIAGNOSIS — J449 Chronic obstructive pulmonary disease, unspecified: Secondary | ICD-10-CM | POA: Diagnosis not present

## 2017-07-30 DIAGNOSIS — R0602 Shortness of breath: Secondary | ICD-10-CM | POA: Diagnosis not present

## 2017-07-30 DIAGNOSIS — Z9989 Dependence on other enabling machines and devices: Secondary | ICD-10-CM

## 2017-07-30 DIAGNOSIS — R05 Cough: Secondary | ICD-10-CM | POA: Diagnosis not present

## 2017-07-30 DIAGNOSIS — R059 Cough, unspecified: Secondary | ICD-10-CM

## 2017-07-30 MED ORDER — FLUTICASONE-UMECLIDIN-VILANT 100-62.5-25 MCG/INH IN AEPB: 1.0000 | INHALATION_SPRAY | Freq: Every day | RESPIRATORY_TRACT | 4 refills | Status: DC

## 2017-07-30 NOTE — Progress Notes (Signed)
[facility] [place], [place] [address]  Pulmonary Sleep Medicine   Office Visit Note  Patient Name: Marcus Delacruz DOB: [birth year] MRN [id number]  Date of Service: 07/30/2017  Complaints/HPI:   Patient has been having cough and congestion.  Was seen by his primary care physician was given a script for prednisone also given script for azithromycin.  He also had a chest x-ray done which did not show any acute pneumonia.  I reviewed the film today.  In addition we also reviewed his pulmonary function studies which were done on the last visit patient has actually an improvement since the previous studies which were done 2 years ago.  States he is having congestion and cough no hemoptysis no chest pain.  No fevers or chills are noted at this time.  ROS  General: (-) fever, (-) chills, (-) night sweats, (-) weakness Skin: (-) rashes, (-) itching,. Eyes: (-) visual changes, (-) redness, (-) itching. Nose and Sinuses: (-) nasal stuffiness or itchiness, (-) postnasal drip, (-) nosebleeds, (-) sinus trouble. Mouth and Throat: (-) sore throat, (-) hoarseness. Neck: (-) swollen glands, (-) enlarged thyroid, (-) neck pain. Respiratory: + cough, (-) bloody sputum, - shortness of breath, - wheezing. Cardiovascular: - ankle swelling, (-) chest pain. Lymphatic: (-) lymph node enlargement. Neurologic: (-) numbness, (-) tingling. Psychiatric: (-) anxiety, (-) depression   Current Medication: Outpatient Encounter Medications as of 07/30/2017  Medication Sig  . albuterol (PROVENTIL HFA;VENTOLIN HFA) 108 (90 Base) MCG/ACT inhaler Inhale 2 puffs into the lungs every 6 (six) hours as needed for wheezing or shortness of breath.  Marland Kitchen albuterol (PROVENTIL) (2.5 MG/3ML) 0.083% nebulizer solution USE ONE VIAL BY NEBULIZER ONCE DAILY AS DIRECTED BY PHYSICIAN. (Patient not taking: Reported on 07/27/2017)  . aspirin 81 MG tablet Take 81 mg by mouth daily.  Marland Kitchen atorvastatin (LIPITOR) 40  MG tablet TAKE (1) TABLET BY MOUTH EVERY DAY  . azithromycin (ZITHROMAX) 250 MG tablet 2 today then 1 a day for 4 days  . fexofenadine (ALLEGRA) 180 MG tablet Take 1 tablet (180 mg total) by mouth daily.  . finasteride (PROSCAR) 5 MG tablet Take 1 tablet (5 mg total) daily by mouth. (Patient taking differently: Take 5 mg by mouth daily. Zara Council)  . ibuprofen (ADVIL,MOTRIN) 800 MG tablet TAKE (1) TABLET BY MOUTH THREE TIMES A DAY  . ipratropium-albuterol (DUONEB) 0.5-2.5 (3) MG/3ML SOLN Take 3 mLs by nebulization every 6 (six) hours as needed.  Marland Kitchen lisinopril-hydrochlorothiazide (PRINZIDE,ZESTORETIC) 10-12.5 MG tablet 1 a day  . montelukast (SINGULAIR) 10 MG tablet TAKE (1) TABLET BY MOUTH EVERY DAY  . predniSONE (DELTASONE) 10 MG tablet Taper 6,6,6,5,5,5,4,4,3,3,2,2,1,1  . umeclidinium-vilanterol (ANORO ELLIPTA) 62.5-25 MCG/INH AEPB Inhale 1 puff into the lungs daily. (Patient not taking: Reported on 07/30/2017)   Facility-Administered Encounter Medications as of 07/30/2017  Medication  . ipratropium-albuterol (DUONEB) 0.5-2.5 (3) MG/3ML nebulizer solution 3 mL  . ipratropium-albuterol (DUONEB) 0.5-2.5 (3) MG/3ML nebulizer solution 3 mL    Surgical History: Past Surgical History:  Procedure Laterality Date  . ARTERY REPAIR    . BACK SURGERY    . CHOLECYSTECTOMY    . COLONOSCOPY  2013   cleared  . HERNIA REPAIR      Medical History: Past Medical History:  Diagnosis Date  . Allergic rhinitis due to pollen   . Asthma   . BPH (benign prostatic hyperplasia)   . Cardiomegaly   . Chronic obstructive pulmonary disease (COPD) ([place])   . Hypercholesteremia   .  Hypersomnia, unspecified   . Hypertension   . Obstructive sleep apnea (adult) (pediatric)   . Shortness of breath   . Snoring   . Solitary pulmonary nodule     Family History: Family History  Problem Relation Age of Onset  . COPD Mother   . Asthma Son   . Cancer Father        lung  . Prostate cancer Neg Hx   .  Kidney disease Neg Hx     Social History: Social History   Socioeconomic History  . Marital status: Married    Spouse name: Not on file  . Number of children: Not on file  . Years of education: Not on file  . Highest education level: Not on file  Occupational History  . Not on file  Social Needs  . Financial resource strain: Not on file  . Food insecurity:    Worry: Not on file    Inability: Not on file  . Transportation needs:    Medical: Not on file    Non-medical: Not on file  Tobacco Use  . Smoking status: Former Smoker    Packs/day: 1.00    Years: 20.00    Pack years: 20.00    Types: Cigarettes    Last attempt to quit: 08/09/1983    Years since quitting: 33.9  . Smokeless tobacco: Never Used  Substance and Sexual Activity  . Alcohol use: Yes    Alcohol/week: 0.0 oz    Comment: once monthly  . Drug use: No  . Sexual activity: Not on file  Lifestyle  . Physical activity:    Days per week: Not on file    Minutes per session: Not on file  . Stress: Not on file  Relationships  . Social connections:    Talks on phone: Not on file    Gets together: Not on file    Attends religious service: Not on file    Active member of club or organization: Not on file    Attends meetings of clubs or organizations: Not on file    Relationship status: Not on file  . Intimate partner violence:    Fear of current or ex partner: Not on file    Emotionally abused: Not on file    Physically abused: Not on file    Forced sexual activity: Not on file  Other Topics Concern  . Not on file  Social History Narrative  . Not on file    Vital Signs: Blood pressure 130/70, pulse 81, resp. rate 16, height 5\' 10"  (1.778 m), weight 183 lb 9.6 oz (83.3 kg), SpO2 96 %.  Examination: General Appearance: The patient is well-developed, well-nourished, and in no distress. Skin: Gross inspection of skin unremarkable. Head: normocephalic, no gross deformities. Eyes: no gross deformities  noted. ENT: ears appear grossly normal no exudates. Neck: Supple. No thyromegaly. No LAD. Respiratory: scattered ronchi. Cardiovascular: Normal S1 and S2 without murmur or rub. Extremities: No cyanosis. pulses are equal. Neurologic: Alert and oriented. No involuntary movements.  LABS: Recent Results (from the past 2160 hour(s))  Lipid Panel With LDL/HDL Ratio     Status: None   Collection Time: 07/09/17 11:25 AM  Result Value Ref Range   Cholesterol, Total 151 100 - 199 mg/dL   Triglycerides 104 0 - 149 mg/dL   HDL 52 >39 mg/dL   VLDL Cholesterol Cal 21 5 - 40 mg/dL   LDL Calculated 78 0 - 99 mg/dL   LDl/HDL Ratio 1.5 0.0 -  3.6 ratio    Comment:                                     LDL/HDL Ratio                                             Men  Women                               1/2 Avg.Risk  1.0    1.5                                   Avg.Risk  3.6    3.2                                2X Avg.Risk  6.2    5.0                                3X Avg.Risk  8.0    6.1   Hepatic function panel     Status: Abnormal   Collection Time: 07/09/17 11:25 AM  Result Value Ref Range   Total Protein 7.3 6.0 - 8.5 g/dL   Bilirubin Total 0.5 0.0 - 1.2 mg/dL   Bilirubin, Direct 0.14 0.00 - 0.40 mg/dL   Alkaline Phosphatase 83 39 - 117 IU/L   AST 51 (H) 0 - 40 IU/L   ALT 25 0 - 44 IU/L  Renal Function Panel     Status: None   Collection Time: 07/09/17 11:25 AM  Result Value Ref Range   Glucose 73 65 - 99 mg/dL   BUN 10 8 - 27 mg/dL   Creatinine, Ser 0.95 0.76 - 1.27 mg/dL   GFR calc non Af Amer 78 >59 mL/min/1.73   GFR calc Af Amer 90 >59 mL/min/1.73   BUN/Creatinine Ratio 11 10 - 24   Sodium 140 134 - 144 mmol/L   Potassium 4.4 3.5 - 5.2 mmol/L   Chloride 100 96 - 106 mmol/L   CO2 23 20 - 29 mmol/L   Calcium 9.3 8.6 - 10.2 mg/dL   Phosphorus 2.9 2.5 - 4.5 mg/dL   Albumin 4.5 3.5 - 4.8 g/dL    Radiology: Dg Chest 2 View  Result Date: 07/27/2017 CLINICAL DATA:  Nonproductive  cough.  Shortness of breath. EXAM: CHEST - 2 VIEW COMPARISON:  November 23, 2013 FINDINGS: The heart size and mediastinal contours are within normal limits. Both lungs are clear. The visualized skeletal structures are unremarkable. IMPRESSION: No active cardiopulmonary disease. Electronically Signed   By: Dorise Bullion III M.D   On: 07/27/2017 16:33    Dg Chest 2 View  Result Date: 07/27/2017 CLINICAL DATA:  Nonproductive cough.  Shortness of breath. EXAM: CHEST - 2 VIEW COMPARISON:  November 23, 2013 FINDINGS: The heart size and mediastinal contours are within normal limits. Both lungs are clear. The visualized skeletal structures are unremarkable. IMPRESSION: No active cardiopulmonary disease. Electronically Signed   By: Dorise Bullion III M.D   On: 07/27/2017 16:33  Dg Chest 2 View  Result Date: 07/27/2017 CLINICAL DATA:  Nonproductive cough.  Shortness of breath. EXAM: CHEST - 2 VIEW COMPARISON:  November 23, 2013 FINDINGS: The heart size and mediastinal contours are within normal limits. Both lungs are clear. The visualized skeletal structures are unremarkable. IMPRESSION: No active cardiopulmonary disease. Electronically Signed   By: Dorise Bullion III M.D   On: 07/27/2017 16:33   Mr Shoulder Left Wo Contrast  Result Date: 07/21/2017 CLINICAL DATA:  Left shoulder pain and limited range of motion since a fall out of a truck 1 month ago. EXAM: MRI OF THE LEFT SHOULDER WITHOUT CONTRAST TECHNIQUE: Multiplanar, multisequence MR imaging of the shoulder was performed. No intravenous contrast was administered. COMPARISON:  None. FINDINGS: Rotator cuff: There is fraying of the bursal surface of the anterior aspect of the distal supraspinatus tendon without a discrete tear. The rotator cuff is otherwise intact. Muscles: No atrophy or abnormal signal of the muscles of the rotator cuff. Biceps long head:  Properly located and intact. Acromioclavicular Joint: Normal. Type 1 acromion. Mild  subacromial/subdeltoid bursitis. Glenohumeral Joint: Normal. Labrum:  Normal. Bones:  Normal. Other: None IMPRESSION: 1. Slight fraying of the bursal surface of the anterior aspect of the distal supraspinatus tendon. 2. Slight subacromial/subdeltoid bursitis. Electronically Signed   By: Lorriane Shire M.D.   On: 07/21/2017 10:24      Assessment and Plan: Patient Active Problem List   Diagnosis Date Noted  . Chronic obstructive pulmonary disease with acute exacerbation ([place]) 07/27/2017  . Asthma, chronic, severe persistent, uncomplicated 59/93/5701  . Seasonal allergic rhinitis due to pollen 08/18/2016  . Bilateral carotid artery stenosis 04/03/2016  . Bilateral hydrocele 11/22/2014  . BPH with obstruction/lower urinary tract symptoms 10/05/2014  . Epididymal cyst 10/05/2014  . Familial multiple lipoprotein-type hyperlipidemia 07/14/2014  . Laboratory animal allergy 07/14/2014  . Acute arthropathy 07/14/2014  . Routine general medical examination at a health care facility 07/14/2014  . Asthma with exacerbation 07/14/2014  . Essential (primary) hypertension 07/14/2014  . Screening for depression 07/14/2014  . Noncompliance w/medication treatment due to intermit use of medication 11/23/2013  . Asthma, chronic 08/08/2013    1. Cough increased symptoms had been given a script for zpak just started taking it. Patient states he has also had a script for steroids will switch to trelogy from anoro 2. COPD MILD disease based on the last PFT.  3. OSA Using CPAP will be continued 4. SOB increased symptoms due to acute flare up  General Counseling: I have discussed the findings of the evaluation and examination with Claris.  I have also discussed any further diagnostic evaluation thatmay be needed or ordered today. Saif verbalizes understanding of the findings of todays visit. We also reviewed his medications today and discussed drug interactions and side effects including but not limited  excessive drowsiness and altered mental states. We also discussed that there is always a risk not just to him but also people around him. he has been encouraged to call the office with any questions or concerns that should arise related to todays visit.    Time spent: 6min  I have personally obtained a history, examined the patient, evaluated laboratory and imaging results, formulated the assessment and plan and placed orders.    Allyne Gee, MD [facility] Pulmonary and Critical Care Sleep medicine

## 2017-07-30 NOTE — Patient Instructions (Signed)

## 2017-08-17 ENCOUNTER — Ambulatory Visit: Payer: Medicare PPO

## 2017-08-19 ENCOUNTER — Ambulatory Visit: Payer: Medicare PPO

## 2017-08-19 ENCOUNTER — Other Ambulatory Visit: Payer: Self-pay | Admitting: Family Medicine

## 2017-08-19 ENCOUNTER — Encounter: Payer: Medicare PPO | Admitting: Family Medicine

## 2017-08-19 DIAGNOSIS — M5136 Other intervertebral disc degeneration, lumbar region: Secondary | ICD-10-CM

## 2017-08-19 DIAGNOSIS — J301 Allergic rhinitis due to pollen: Secondary | ICD-10-CM

## 2017-08-20 ENCOUNTER — Ambulatory Visit (INDEPENDENT_AMBULATORY_CARE_PROVIDER_SITE_OTHER): Payer: Medicare PPO | Admitting: Family Medicine

## 2017-08-20 ENCOUNTER — Encounter: Payer: Self-pay | Admitting: Family Medicine

## 2017-08-20 VITALS — BP 138/70 | HR 86 | Ht 70.0 in | Wt 181.0 lb

## 2017-08-20 DIAGNOSIS — Z0000 Encounter for general adult medical examination without abnormal findings: Secondary | ICD-10-CM | POA: Diagnosis not present

## 2017-08-20 DIAGNOSIS — J4550 Severe persistent asthma, uncomplicated: Secondary | ICD-10-CM | POA: Diagnosis not present

## 2017-08-20 DIAGNOSIS — I6523 Occlusion and stenosis of bilateral carotid arteries: Secondary | ICD-10-CM | POA: Diagnosis not present

## 2017-08-20 DIAGNOSIS — E785 Hyperlipidemia, unspecified: Secondary | ICD-10-CM | POA: Diagnosis not present

## 2017-08-20 DIAGNOSIS — N138 Other obstructive and reflux uropathy: Secondary | ICD-10-CM

## 2017-08-20 DIAGNOSIS — N401 Enlarged prostate with lower urinary tract symptoms: Secondary | ICD-10-CM

## 2017-08-20 DIAGNOSIS — Z1211 Encounter for screening for malignant neoplasm of colon: Secondary | ICD-10-CM

## 2017-08-20 LAB — HEMOCCULT GUIAC POC 1CARD (OFFICE): Fecal Occult Blood, POC: NEGATIVE

## 2017-08-20 NOTE — Progress Notes (Signed)
Name: Marcus Delacruz   MRN: [id number]    DOB: Jul 22, 1940   Date:08/20/2017       Progress Note  Subjective  Chief Complaint  Chief Complaint  Patient presents with  . Annual Exam    Patient is a 77 year old male who presents for a comprehensive physical exam. The patient reports the following problems: discussed asthma concerns. Health maintenance has been reviewed and up to date on indices.   No problem-specific Assessment & Plan notes found for this encounter.   Past Medical History:  Diagnosis Date  . Allergic rhinitis due to pollen   . Asthma   . BPH (benign prostatic hyperplasia)   . Cardiomegaly   . Chronic obstructive pulmonary disease (COPD) ([place])   . Hypercholesteremia   . Hypersomnia, unspecified   . Hypertension   . Obstructive sleep apnea (adult) (pediatric)   . Shortness of breath   . Snoring   . Solitary pulmonary nodule     Past Surgical History:  Procedure Laterality Date  . ARTERY REPAIR    . BACK SURGERY    . CHOLECYSTECTOMY    . COLONOSCOPY  2013   cleared  . HERNIA REPAIR      Family History  Problem Relation Age of Onset  . COPD Mother   . Asthma Son   . Cancer Father        lung  . Prostate cancer Neg Hx   . Kidney disease Neg Hx     Social History   Socioeconomic History  . Marital status: Married    Spouse name: Not on file  . Number of children: Not on file  . Years of education: Not on file  . Highest education level: Not on file  Occupational History  . Not on file  Social Needs  . Financial resource strain: Not on file  . Food insecurity:    Worry: Not on file    Inability: Not on file  . Transportation needs:    Medical: Not on file    Non-medical: Not on file  Tobacco Use  . Smoking status: Former Smoker    Packs/day: 1.00    Years: 20.00    Pack years: 20.00    Types: Cigarettes    Last attempt to quit: 08/09/1983    Years since quitting: 34.0  . Smokeless tobacco: Never Used  Substance and Sexual  Activity  . Alcohol use: Yes    Alcohol/week: 0.0 oz    Comment: once monthly  . Drug use: No  . Sexual activity: Not on file  Lifestyle  . Physical activity:    Days per week: Not on file    Minutes per session: Not on file  . Stress: Not on file  Relationships  . Social connections:    Talks on phone: Not on file    Gets together: Not on file    Attends religious service: Not on file    Active member of club or organization: Not on file    Attends meetings of clubs or organizations: Not on file    Relationship status: Not on file  . Intimate partner violence:    Fear of current or ex partner: Not on file    Emotionally abused: Not on file    Physically abused: Not on file    Forced sexual activity: Not on file  Other Topics Concern  . Not on file  Social History Narrative  . Not on file    Allergies  Allergen Reactions  . Codeine Itching    Outpatient Medications Prior to Visit  Medication Sig Dispense Refill  . albuterol (PROVENTIL HFA;VENTOLIN HFA) 108 (90 Base) MCG/ACT inhaler Inhale 2 puffs into the lungs every 6 (six) hours as needed for wheezing or shortness of breath. 1 Inhaler 11  . aspirin 81 MG tablet Take 81 mg by mouth daily.    Marland Kitchen atorvastatin (LIPITOR) 40 MG tablet TAKE (1) TABLET BY MOUTH EVERY DAY 90 tablet 1  . fexofenadine (ALLEGRA) 180 MG tablet Take 1 tablet (180 mg total) by mouth daily. 90 tablet 3  . finasteride (PROSCAR) 5 MG tablet Take 1 tablet (5 mg total) daily by mouth. (Patient taking differently: Take 5 mg by mouth daily. Shannon Mcgowan) 90 tablet 4  . Fluticasone-Umeclidin-Vilant (TRELEGY ELLIPTA) 100-62.5-25 MCG/INH AEPB Inhale 1 puff into the lungs daily. 1 each 4  . ibuprofen (ADVIL,MOTRIN) 800 MG tablet TAKE (1) TABLET BY MOUTH THREE TIMES A DAY 270 tablet 0  . ipratropium-albuterol (DUONEB) 0.5-2.5 (3) MG/3ML SOLN Take 3 mLs by nebulization every 6 (six) hours as needed. 360 mL 1  . lisinopril-hydrochlorothiazide  (PRINZIDE,ZESTORETIC) 10-12.5 MG tablet 1 a day 90 tablet 1  . montelukast (SINGULAIR) 10 MG tablet TAKE (1) TABLET BY MOUTH EVERY DAY 90 tablet 1  . albuterol (PROVENTIL) (2.5 MG/3ML) 0.083% nebulizer solution USE ONE VIAL BY NEBULIZER ONCE DAILY AS DIRECTED BY PHYSICIAN. 75 mL 1  . azithromycin (ZITHROMAX) 250 MG tablet 2 today then 1 a day for 4 days 6 tablet 0  . umeclidinium-vilanterol (ANORO ELLIPTA) 62.5-25 MCG/INH AEPB Inhale 1 puff into the lungs daily. (Patient not taking: Reported on 07/30/2017) 1 each 4   Facility-Administered Medications Prior to Visit  Medication Dose Route Frequency Provider Last Rate Last Dose  . ipratropium-albuterol (DUONEB) 0.5-2.5 (3) MG/3ML nebulizer solution 3 mL  3 mL Nebulization Once Jones, Deanna C, MD      . ipratropium-albuterol (DUONEB) 0.5-2.5 (3) MG/3ML nebulizer solution 3 mL  3 mL Nebulization Once Juline Patch, MD        Review of Systems  Constitutional: Negative for chills, fever, malaise/fatigue and weight loss.  HENT: Negative for ear discharge, ear pain and sore throat.   Eyes: Negative for blurred vision.  Respiratory: Positive for wheezing. Negative for cough, hemoptysis, sputum production and shortness of breath.   Cardiovascular: Negative for chest pain, palpitations and leg swelling.  Gastrointestinal: Negative for abdominal pain, blood in stool, constipation, diarrhea, heartburn, melena and nausea.  Genitourinary: Negative for dysuria, frequency, hematuria and urgency.  Musculoskeletal: Negative for back pain, joint pain, myalgias and neck pain.  Skin: Negative for rash.  Neurological: Negative for dizziness, tingling, sensory change, focal weakness and headaches.  Endo/Heme/Allergies: Negative for environmental allergies and polydipsia. Does not bruise/bleed easily.  Psychiatric/Behavioral: Negative for depression and suicidal ideas. The patient is not nervous/anxious and does not have insomnia.      Objective  Vitals:    08/20/17 0850  BP: 138/70  Pulse: 86  Weight: 181 lb (82.1 kg)  Height: 5\' 10"  (1.778 m)    Physical Exam  Constitutional: He is oriented to person, place, and time. Vital signs are normal. He appears well-developed and well-nourished. No distress.  HENT:  Head: Normocephalic.  Right Ear: Hearing, tympanic membrane, external ear and ear canal normal.  Left Ear: Hearing, tympanic membrane, external ear and ear canal normal.  Nose: Nose normal. No mucosal edema.  Mouth/Throat: Uvula is midline and oropharynx is clear and moist. No  oropharyngeal exudate, posterior oropharyngeal edema or posterior oropharyngeal erythema.  Eyes: Pupils are equal, round, and reactive to light. Conjunctivae, EOM and lids are normal. Right eye exhibits no discharge. Left eye exhibits no discharge. Right conjunctiva is not injected. Left conjunctiva is not injected. No scleral icterus.  Neck: Normal range of motion. Neck supple. Decreased carotid pulses present. No hepatojugular reflux and no JVD present. Carotid bruit is not present. No tracheal deviation present. No thyromegaly present.  Cardiovascular: Normal rate, regular rhythm, S1 normal, S2 normal, normal heart sounds and intact distal pulses. PMI is not displaced. Exam reveals no gallop, no S3, no S4 and no friction rub.  No murmur heard.  No systolic murmur is present.  No diastolic murmur is present. Pulses:      Carotid pulses are 1+ on the right side, and 1+ on the left side.      Radial pulses are 2+ on the right side, and 2+ on the left side.       Femoral pulses are 2+ on the right side, and 2+ on the left side.      Popliteal pulses are 2+ on the right side, and 2+ on the left side.       Dorsalis pedis pulses are 2+ on the right side, and 2+ on the left side.       Posterior tibial pulses are 2+ on the right side, and 2+ on the left side.  Pulmonary/Chest: Breath sounds normal. No stridor. No respiratory distress. He has no decreased breath  sounds. He has no wheezes. He has no rhonchi. He has no rales. Right breast exhibits no mass. Left breast exhibits no mass.  Abdominal: Soft. Normal appearance and bowel sounds are normal. He exhibits no mass. There is no hepatosplenomegaly. There is no tenderness. There is no rebound, no guarding and no CVA tenderness.  Genitourinary: Rectum normal, prostate normal, testes normal and penis normal. Prostate is not enlarged and not tender. Right testis shows no mass and no tenderness. Left testis shows no mass and no tenderness. Circumcised.  Musculoskeletal: Normal range of motion. He exhibits no edema or tenderness.  Lymphadenopathy:    He has no cervical adenopathy.    He has no axillary adenopathy.  Neurological: He is alert and oriented to person, place, and time. He has normal strength and normal reflexes. A cranial nerve deficit is present.  Reflex Scores:      Tricep reflexes are 2+ on the right side and 2+ on the left side.      Bicep reflexes are 2+ on the right side and 2+ on the left side.      Brachioradialis reflexes are 2+ on the right side and 2+ on the left side.      Patellar reflexes are 2+ on the right side and 2+ on the left side.      Achilles reflexes are 2+ on the right side and 2+ on the left side. Asymmetrical smile/mild ptosis left noted by neurologist  Skin: Skin is warm, dry and intact. No rash noted.  Nursing note and vitals reviewed.     Assessment & Plan  Problem List Items Addressed This Visit      Cardiovascular and Mediastinum   Bilateral carotid artery stenosis     Respiratory   Asthma, chronic, severe persistent, uncomplicated   Relevant Orders   Ambulatory referral to Pulmonology     Genitourinary   BPH with obstruction/lower urinary tract symptoms   Relevant Orders  PSA    Other Visit Diagnoses    Annual physical exam    -  Primary   Relevant Orders   Lipid panel   Renal Function Panel   Colon cancer screening       Relevant Orders    POCT occult blood stool (Completed)      No orders of the defined types were placed in this encounter.     Dr. [facility]  08/20/17

## 2017-08-21 LAB — RENAL FUNCTION PANEL
Albumin: 4 g/dL (ref 3.5–4.8)
BUN / CREAT RATIO: 15 (ref 10–24)
BUN: 15 mg/dL (ref 8–27)
CO2: 23 mmol/L (ref 20–29)
Calcium: 8.9 mg/dL (ref 8.6–10.2)
Chloride: 102 mmol/L (ref 96–106)
Creatinine, Ser: 0.99 mg/dL (ref 0.76–1.27)
GFR calc non Af Amer: 74 mL/min/{1.73_m2} (ref >59)
GFR, EST AFRICAN AMERICAN: 86 mL/min/{1.73_m2} (ref >59)
GLUCOSE: 78 mg/dL (ref 65–99)
POTASSIUM: 4.2 mmol/L (ref 3.5–5.2)
Phosphorus: 2.3 mg/dL — ABNORMAL LOW (ref 2.5–4.5)
Sodium: 141 mmol/L (ref 134–144)

## 2017-08-21 LAB — LIPID PANEL
Chol/HDL Ratio: 3.5 ratio (ref 0.0–5.0)
Cholesterol, Total: 194 mg/dL (ref 100–199)
HDL: 56 mg/dL (ref >39)
LDL CALC: 119 mg/dL — AB (ref 0–99)
Triglycerides: 95 mg/dL (ref 0–149)
VLDL CHOLESTEROL CAL: 19 mg/dL (ref 5–40)

## 2017-08-21 LAB — PSA: Prostate Specific Ag, Serum: 0.8 ng/mL (ref 0.0–4.0)

## 2017-09-15 DIAGNOSIS — G4733 Obstructive sleep apnea (adult) (pediatric): Secondary | ICD-10-CM | POA: Diagnosis not present

## 2017-09-16 ENCOUNTER — Ambulatory Visit: Payer: Self-pay

## 2017-10-21 ENCOUNTER — Ambulatory Visit: Payer: Medicare PPO

## 2017-10-21 DIAGNOSIS — G4733 Obstructive sleep apnea (adult) (pediatric): Secondary | ICD-10-CM | POA: Diagnosis not present

## 2017-10-21 NOTE — Progress Notes (Signed)
95 percentile pressure 11   95th percentile leak 9.9   apnea index 0.2 /hr  apnea-hypopnea index  0.8 /hr   total days used  >4 hr 87 days  total days used <4 hr 2 days  Total compliance 97 percent  Patient doing very good.

## 2017-11-13 ENCOUNTER — Other Ambulatory Visit: Payer: Self-pay | Admitting: Family Medicine

## 2017-12-07 ENCOUNTER — Ambulatory Visit (INDEPENDENT_AMBULATORY_CARE_PROVIDER_SITE_OTHER): Payer: Medicare PPO | Admitting: Family Medicine

## 2017-12-07 ENCOUNTER — Encounter: Payer: Self-pay | Admitting: Family Medicine

## 2017-12-07 VITALS — BP 136/62 | HR 72 | Ht 70.0 in | Wt 183.0 lb

## 2017-12-07 DIAGNOSIS — M7521 Bicipital tendinitis, right shoulder: Secondary | ICD-10-CM

## 2017-12-07 DIAGNOSIS — Z23 Encounter for immunization: Secondary | ICD-10-CM

## 2017-12-07 DIAGNOSIS — M25511 Pain in right shoulder: Secondary | ICD-10-CM | POA: Diagnosis not present

## 2017-12-07 MED ORDER — MELOXICAM 15 MG PO TABS: 15.0000 mg | ORAL_TABLET | Freq: Every day | ORAL | 0 refills | Status: DC

## 2017-12-07 NOTE — Progress Notes (Signed)
Date:  12/07/2017   Name:  Marcus Delacruz   DOB:  May 20, 1940   MRN:  [id number]   Chief Complaint: Shoulder Pain (R) shoulder pain x 6 weeks ago- doesn't know of anything he did to it) Shoulder Pain   The pain is present in the right shoulder. This is a new problem. The current episode started more than 1 month ago (6 weeks ago). There has been no history of extremity trauma. The problem occurs intermittently. The problem has been waxing and waning. The quality of the pain is described as aching and sharp (sharp pain wakes me up/ baseline ache). The pain is at a severity of 7/10. The pain is moderate. Pertinent negatives include no fever, inability to bear weight, joint locking, joint swelling, numbness, stiffness or tingling. The symptoms are aggravated by lying down. He has tried NSAIDS for the symptoms. The treatment provided mild relief.     Review of Systems  Constitutional: Negative for chills and fever.  HENT: Negative for drooling, ear discharge, ear pain and sore throat.   Respiratory: Negative for cough, shortness of breath and wheezing.   Cardiovascular: Negative for chest pain, palpitations and leg swelling.  Gastrointestinal: Negative for abdominal pain, blood in stool, constipation, diarrhea and nausea.  Endocrine: Negative for polydipsia.  Genitourinary: Negative for dysuria, frequency, hematuria and urgency.  Musculoskeletal: Negative for back pain, myalgias, neck pain and stiffness.  Skin: Negative for rash.  Allergic/Immunologic: Negative for environmental allergies.  Neurological: Negative for dizziness, tingling, numbness and headaches.  Hematological: Does not bruise/bleed easily.  Psychiatric/Behavioral: Negative for suicidal ideas. The patient is not nervous/anxious.     Patient Active Problem List   Diagnosis Date Noted  . Chronic obstructive pulmonary disease with acute exacerbation ([place]) 07/27/2017  . Asthma, chronic, severe persistent, uncomplicated  42/59/5638  . Seasonal allergic rhinitis due to pollen 08/18/2016  . Bilateral carotid artery stenosis 04/03/2016  . Bilateral hydrocele 11/22/2014  . BPH with obstruction/lower urinary tract symptoms 10/05/2014  . Epididymal cyst 10/05/2014  . Familial multiple lipoprotein-type hyperlipidemia 07/14/2014  . Laboratory animal allergy 07/14/2014  . Acute arthropathy 07/14/2014  . Routine general medical examination at a health care facility 07/14/2014  . Asthma with exacerbation 07/14/2014  . Essential (primary) hypertension 07/14/2014  . Screening for depression 07/14/2014  . Noncompliance w/medication treatment due to intermit use of medication 11/23/2013  . Asthma, chronic 08/08/2013    Allergies  Allergen Reactions  . Codeine Itching    Past Surgical History:  Procedure Laterality Date  . ARTERY REPAIR    . BACK SURGERY    . CHOLECYSTECTOMY    . COLONOSCOPY  2013   cleared  . HERNIA REPAIR      Social History   Tobacco Use  . Smoking status: Former Smoker    Packs/day: 1.00    Years: 20.00    Pack years: 20.00    Types: Cigarettes    Last attempt to quit: 08/09/1983    Years since quitting: 34.3  . Smokeless tobacco: Never Used  Substance Use Topics  . Alcohol use: Yes    Alcohol/week: 0.0 standard drinks    Comment: once monthly  . Drug use: No     Medication list has been reviewed and updated.  Current Meds  Medication Sig  . albuterol (PROVENTIL HFA;VENTOLIN HFA) 108 (90 Base) MCG/ACT inhaler Inhale 2 puffs into the lungs every 6 (six) hours as needed for wheezing or shortness of breath.  Marland Kitchen aspirin 81 MG  tablet Take 81 mg by mouth daily.  Marland Kitchen atorvastatin (LIPITOR) 40 MG tablet TAKE (1) TABLET BY MOUTH EVERY DAY  . fexofenadine (ALLEGRA) 180 MG tablet TAKE (1) TABLET BY MOUTH EVERY DAY  . finasteride (PROSCAR) 5 MG tablet Take 1 tablet (5 mg total) daily by mouth. (Patient taking differently: Take 5 mg by mouth daily. Zara Council)  .  Fluticasone-Umeclidin-Vilant (TRELEGY ELLIPTA) 100-62.5-25 MCG/INH AEPB Inhale 1 puff into the lungs daily.  Marland Kitchen lisinopril-hydrochlorothiazide (PRINZIDE,ZESTORETIC) 10-12.5 MG tablet 1 a day  . montelukast (SINGULAIR) 10 MG tablet TAKE (1) TABLET BY MOUTH EVERY DAY  . [DISCONTINUED] ibuprofen (ADVIL,MOTRIN) 800 MG tablet TAKE (1) TABLET BY MOUTH THREE TIMES A DAY   Current Facility-Administered Medications for the 12/07/17 encounter (Office Visit) with Juline Patch, MD  Medication  . ipratropium-albuterol (DUONEB) 0.5-2.5 (3) MG/3ML nebulizer solution 3 mL  . ipratropium-albuterol (DUONEB) 0.5-2.5 (3) MG/3ML nebulizer solution 3 mL    PHQ 2/9 Scores 03/23/2017 08/18/2016  PHQ - 2 Score 0 0    Physical Exam  Constitutional: He is oriented to person, place, and time.  HENT:  Head: Normocephalic.  Right Ear: External ear normal.  Left Ear: External ear normal.  Nose: Nose normal.  Mouth/Throat: Oropharynx is clear and moist.  Eyes: Pupils are equal, round, and reactive to light. Conjunctivae and EOM are normal. Right eye exhibits no discharge. Left eye exhibits no discharge. No scleral icterus.  Neck: Normal range of motion. Neck supple. No JVD present. No tracheal deviation present. No thyromegaly present.  Cardiovascular: Normal rate, regular rhythm, normal heart sounds and intact distal pulses. Exam reveals no gallop and no friction rub.  No murmur heard. Pulmonary/Chest: Breath sounds normal. No respiratory distress. He has no wheezes. He has no rales.  Abdominal: Soft. Bowel sounds are normal. He exhibits no mass. There is no hepatosplenomegaly. There is no tenderness. There is no rebound, no guarding and no CVA tenderness.  Musculoskeletal: He exhibits no edema.       Right shoulder: He exhibits decreased range of motion, tenderness and abnormal pulse. He exhibits no bony tenderness, no swelling and normal strength.  Tender bicep tendon  Lymphadenopathy:    He has no cervical  adenopathy.  Neurological: He is alert and oriented to person, place, and time. He has normal strength and normal reflexes. No cranial nerve deficit.  Skin: Skin is warm. No rash noted.  Nursing note and vitals reviewed.   BP 136/62   Pulse 72   Ht 5\' 10"  (1.778 m)   Wt 183 lb (83 kg)   BMI 26.26 kg/m   Assessment and Plan:  .1. Acute pain of right shoulder Recent onset while lying supine. Trial meloxicam if persists next step ortho referral. - meloxicam (MOBIC) 15 MG tablet; Take 1 tablet (15 mg total) by mouth daily.  Dispense: 30 tablet; Refill: 0  2. Biceps tendinitis of right upper extremity Trial  meloxicam 15 mg daily - meloxicam (MOBIC) 15 MG tablet; Take 1 tablet (15 mg total) by mouth daily.  Dispense: 30 tablet; Refill: 0  3. Need for shingles vaccine Shingrex prescription provided.  4. Influenza vaccine needed Discussed and administered Recent onset when lying supine.    Dr. [facility]  12/07/2017

## 2017-12-10 ENCOUNTER — Other Ambulatory Visit: Payer: Self-pay | Admitting: Family Medicine

## 2017-12-10 DIAGNOSIS — J301 Allergic rhinitis due to pollen: Secondary | ICD-10-CM

## 2017-12-15 DIAGNOSIS — G4733 Obstructive sleep apnea (adult) (pediatric): Secondary | ICD-10-CM | POA: Diagnosis not present

## 2017-12-16 ENCOUNTER — Ambulatory Visit: Payer: Self-pay

## 2018-01-07 ENCOUNTER — Ambulatory Visit
Admission: RE | Admit: 2018-01-07 | Discharge: 2018-01-07 | Disposition: A | Discharge status: Home / Self Care | Payer: Medicare PPO | Source: Ambulatory Visit | Attending: Family Medicine | Admitting: Family Medicine

## 2018-01-07 ENCOUNTER — Encounter: Payer: Self-pay | Admitting: Family Medicine

## 2018-01-07 ENCOUNTER — Ambulatory Visit (INDEPENDENT_AMBULATORY_CARE_PROVIDER_SITE_OTHER): Payer: Medicare PPO | Admitting: Family Medicine

## 2018-01-07 VITALS — BP 120/60 | HR 80 | Ht 70.0 in | Wt 183.0 lb

## 2018-01-07 DIAGNOSIS — M25511 Pain in right shoulder: Secondary | ICD-10-CM

## 2018-01-07 NOTE — Progress Notes (Signed)
Date:  01/07/2018   Name:  Marcus Delacruz   DOB:  December 30, 1940   MRN:  [id number]   Chief Complaint: Shoulder Pain (R) shoulder pain- used all Meloxicam/ shoulder still hurting. Gets worse when laying down at night or to take a nap. No problems with ROM) Shoulder Pain   The pain is present in the right shoulder. This is a new problem. The current episode started more than 1 month ago. There has been no history of extremity trauma. The problem occurs constantly. The problem has been unchanged. The quality of the pain is described as aching. The pain is at a severity of 8/10. The pain is moderate. Pertinent negatives include no fever, inability to bear weight, itching, joint locking, joint swelling, limited range of motion, numbness, stiffness or tingling. The symptoms are aggravated by activity. He has tried acetaminophen and NSAIDS for the symptoms. The treatment provided moderate relief.     Review of Systems  Constitutional: Negative for chills and fever.  HENT: Negative for drooling, ear discharge, ear pain and sore throat.   Respiratory: Negative for cough, shortness of breath and wheezing.   Cardiovascular: Negative for chest pain, palpitations and leg swelling.  Gastrointestinal: Negative for abdominal pain, blood in stool, constipation, diarrhea and nausea.  Endocrine: Negative for polydipsia.  Genitourinary: Negative for dysuria, frequency, hematuria and urgency.  Musculoskeletal: Negative for back pain, myalgias, neck pain and stiffness.  Skin: Negative for itching and rash.  Allergic/Immunologic: Negative for environmental allergies.  Neurological: Negative for dizziness, tingling, numbness and headaches.  Hematological: Does not bruise/bleed easily.  Psychiatric/Behavioral: Negative for suicidal ideas. The patient is not nervous/anxious.     Patient Active Problem List   Diagnosis Date Noted  . Chronic obstructive pulmonary disease with acute exacerbation ([place])  07/27/2017  . Asthma, chronic, severe persistent, uncomplicated 26/83/4196  . Seasonal allergic rhinitis due to pollen 08/18/2016  . Bilateral carotid artery stenosis 04/03/2016  . Bilateral hydrocele 11/22/2014  . BPH with obstruction/lower urinary tract symptoms 10/05/2014  . Epididymal cyst 10/05/2014  . Familial multiple lipoprotein-type hyperlipidemia 07/14/2014  . Laboratory animal allergy 07/14/2014  . Acute arthropathy 07/14/2014  . Routine general medical examination at a health care facility 07/14/2014  . Asthma with exacerbation 07/14/2014  . Essential (primary) hypertension 07/14/2014  . Screening for depression 07/14/2014  . Noncompliance w/medication treatment due to intermit use of medication 11/23/2013  . Asthma, chronic 08/08/2013    Allergies  Allergen Reactions  . Codeine Itching    Past Surgical History:  Procedure Laterality Date  . ARTERY REPAIR    . BACK SURGERY    . CHOLECYSTECTOMY    . COLONOSCOPY  2013   cleared  . HERNIA REPAIR      Social History   Tobacco Use  . Smoking status: Former Smoker    Packs/day: 1.00    Years: 20.00    Pack years: 20.00    Types: Cigarettes    Last attempt to quit: 08/09/1983    Years since quitting: 34.4  . Smokeless tobacco: Never Used  Substance Use Topics  . Alcohol use: Yes    Alcohol/week: 0.0 standard drinks    Comment: once monthly  . Drug use: No     Medication list has been reviewed and updated.  Current Meds  Medication Sig  . albuterol (PROVENTIL HFA;VENTOLIN HFA) 108 (90 Base) MCG/ACT inhaler Inhale 2 puffs into the lungs every 6 (six) hours as needed for wheezing or shortness of breath.  Marland Kitchen  ALLERGY RELIEF 180 MG tablet TAKE (1) TABLET BY MOUTH EVERY DAY  . aspirin 81 MG tablet Take 81 mg by mouth daily.  Marland Kitchen atorvastatin (LIPITOR) 40 MG tablet TAKE (1) TABLET BY MOUTH EVERY DAY  . finasteride (PROSCAR) 5 MG tablet Take 1 tablet (5 mg total) daily by mouth. (Patient taking differently: Take 5  mg by mouth daily. Zara Council)  . Fluticasone-Umeclidin-Vilant (TRELEGY ELLIPTA) 100-62.5-25 MCG/INH AEPB Inhale 1 puff into the lungs daily.  Marland Kitchen lisinopril-hydrochlorothiazide (PRINZIDE,ZESTORETIC) 10-12.5 MG tablet 1 a day  . meloxicam (MOBIC) 15 MG tablet Take 1 tablet (15 mg total) by mouth daily.  . montelukast (SINGULAIR) 10 MG tablet TAKE (1) TABLET BY MOUTH EVERY DAY   Current Facility-Administered Medications for the 01/07/18 encounter (Office Visit) with Juline Patch, MD  Medication  . ipratropium-albuterol (DUONEB) 0.5-2.5 (3) MG/3ML nebulizer solution 3 mL  . ipratropium-albuterol (DUONEB) 0.5-2.5 (3) MG/3ML nebulizer solution 3 mL    PHQ 2/9 Scores 03/23/2017 08/18/2016  PHQ - 2 Score 0 0    Physical Exam  Constitutional: He is oriented to person, place, and time. He appears well-developed and well-nourished.  HENT:  Head: Normocephalic.  Right Ear: External ear normal.  Left Ear: External ear normal.  Nose: Nose normal.  Mouth/Throat: Oropharynx is clear and moist.  Eyes: Pupils are equal, round, and reactive to light. Conjunctivae and EOM are normal. Right eye exhibits no discharge. Left eye exhibits no discharge. No scleral icterus.  Neck: Normal range of motion. Neck supple. No JVD present. No tracheal deviation present. No thyromegaly present.  Cardiovascular: Normal rate, regular rhythm, normal heart sounds and intact distal pulses. Exam reveals no gallop and no friction rub.  No murmur heard. Pulmonary/Chest: Breath sounds normal. No respiratory distress. He has no wheezes. He has no rales.  Abdominal: Soft. Bowel sounds are normal. He exhibits no mass. There is no hepatosplenomegaly. There is no tenderness. There is no rebound, no guarding and no CVA tenderness.  Musculoskeletal: Normal range of motion. He exhibits no edema.       Right shoulder: He exhibits tenderness and pain. He exhibits no bony tenderness, no swelling, no effusion, no crepitus, no spasm,  normal pulse and normal strength.  Lymphadenopathy:    He has no cervical adenopathy.  Neurological: He is alert and oriented to person, place, and time. He has normal strength and normal reflexes. No cranial nerve deficit.  Skin: Skin is warm. No rash noted.  Nursing note and vitals reviewed.   BP 120/60   Pulse 80   Ht 5\' 10"  (1.778 m)   Wt 183 lb (83 kg)   BMI 26.26 kg/m   Assessment and Plan:  1. Acute pain of right shoulder Xray of Right shoulder ordered/ referral to ortho/ take Tylenol for pain - DG Shoulder Right; Future - Ambulatory referral to Orthopedic Surgery   Dr. Otilio Miu [facility]  01/07/2018

## 2018-01-08 ENCOUNTER — Other Ambulatory Visit: Payer: Self-pay

## 2018-01-08 DIAGNOSIS — M25511 Pain in right shoulder: Secondary | ICD-10-CM

## 2018-01-21 DIAGNOSIS — M25511 Pain in right shoulder: Secondary | ICD-10-CM | POA: Diagnosis not present

## 2018-01-22 ENCOUNTER — Ambulatory Visit: Payer: Medicare PPO | Admitting: Urology

## 2018-02-01 ENCOUNTER — Ambulatory Visit (INDEPENDENT_AMBULATORY_CARE_PROVIDER_SITE_OTHER): Payer: Medicare PPO | Admitting: Urology

## 2018-02-01 ENCOUNTER — Encounter: Payer: Self-pay | Admitting: Urology

## 2018-02-01 VITALS — BP 151/80 | HR 71 | Wt 182.0 lb

## 2018-02-01 DIAGNOSIS — N401 Enlarged prostate with lower urinary tract symptoms: Secondary | ICD-10-CM | POA: Diagnosis not present

## 2018-02-01 DIAGNOSIS — N138 Other obstructive and reflux uropathy: Secondary | ICD-10-CM | POA: Diagnosis not present

## 2018-02-01 LAB — BLADDER SCAN AMB NON-IMAGING

## 2018-02-01 NOTE — Progress Notes (Signed)
02/01/2018 9:51 AM   Marcus Delacruz August 22, 1940 [id number]  Referring provider: Juline Patch, MD [address] [place], [place] [address]  Chief Complaint  Patient presents with  . Benign Prostatic Hypertrophy    HPI: Patient is a 77 year old Caucasian male with BPH with LUTS who presents today for a yearly follow up.  BPH WITH LUTS His IPSS score today is 1, which is mild lower urinary tract symptomatology. He is pleased with his quality life due to his urinary symptoms.  His I PSS score was 1/0.  He has no major complaints today.  He denies any dysuria, hematuria or suprapubic pain.  He currently taking finasteride 5 mg daily.  He also denies any recent fevers, chills, nausea or vomiting.   He does not have a family history of PCa.  He would like to stop the finasteride at this time and see if his symptoms worsened.   IPSS    Row Name 02/01/18 0900         International Prostate Symptom Score   How often have you had the sensation of not emptying your bladder?  Not at All     How often have you had to urinate less than every two hours?  Not at All     How often have you found you stopped and started again several times when you urinated?  Not at All     How often have you found it difficult to postpone urination?  Not at All     How often have you had a weak urinary stream?  Not at All     How often have you had to strain to start urination?  Not at All     How many times did you typically get up at night to urinate?  1 Time     Total IPSS Score  1       Quality of Life due to urinary symptoms   If you were to spend the rest of your life with your urinary condition just the way it is now how would you feel about that?  Pleased        Score:  1-7 Mild 8-19 Moderate 20-35 Severe   PMH: Past Medical History:  Diagnosis Date  . Allergic rhinitis due to pollen   . Asthma   . BPH (benign prostatic hyperplasia)   . Cardiomegaly   . Chronic obstructive  pulmonary disease (COPD) ([place])   . Hypercholesteremia   . Hypersomnia, unspecified   . Hypertension   . Obstructive sleep apnea (adult) (pediatric)   . Shortness of breath   . Snoring   . Solitary pulmonary nodule     Surgical History: Past Surgical History:  Procedure Laterality Date  . ARTERY REPAIR    . BACK SURGERY    . CHOLECYSTECTOMY    . COLONOSCOPY  2013   cleared  . HERNIA REPAIR      Home Medications:  Allergies as of 02/01/2018      Reactions   Codeine Itching      Medication List        Accurate as of 02/01/18  9:51 AM. Always use your most recent med list.          albuterol 108 (90 Base) MCG/ACT inhaler Commonly known as:  PROVENTIL HFA;VENTOLIN HFA Inhale 2 puffs into the lungs every 6 (six) hours as needed for wheezing or shortness of breath.   ALLERGY RELIEF 180 MG tablet  Generic drug:  fexofenadine TAKE (1) TABLET BY MOUTH EVERY DAY   aspirin 81 MG tablet Take 81 mg by mouth daily.   atorvastatin 40 MG tablet Commonly known as:  LIPITOR TAKE (1) TABLET BY MOUTH EVERY DAY   finasteride 5 MG tablet Commonly known as:  PROSCAR Take 1 tablet (5 mg total) daily by mouth.   Fluticasone-Umeclidin-Vilant 100-62.5-25 MCG/INH Aepb Inhale 1 puff into the lungs daily.   ibuprofen 800 MG tablet Commonly known as:  ADVIL,MOTRIN TAKE (1) TABLET BY MOUTH THREE TIMES A DAY   ipratropium-albuterol 0.5-2.5 (3) MG/3ML Soln Commonly known as:  DUONEB TAKE 3MLS BY NEBULIZATION EVERY 6 HOURS AS NEEDED   lisinopril-hydrochlorothiazide 10-12.5 MG tablet Commonly known as:  PRINZIDE,ZESTORETIC 1 a day   meloxicam 15 MG tablet Commonly known as:  MOBIC Take 1 tablet (15 mg total) by mouth daily.   montelukast 10 MG tablet Commonly known as:  SINGULAIR TAKE (1) TABLET BY MOUTH EVERY DAY       Allergies:  Allergies  Allergen Reactions  . Codeine Itching    Family History: Family History  Problem Relation Age of Onset  . COPD Mother   .  Asthma Son   . Cancer Father        lung  . Prostate cancer Neg Hx   . Kidney disease Neg Hx     Social History:  reports that he quit smoking about 34 years ago. His smoking use included cigarettes. He has a 20.00 pack-year smoking history. He has never used smokeless tobacco. He reports that he drinks alcohol. He reports that he does not use drugs.  ROS: UROLOGY Frequent Urination?: No Hard to postpone urination?: No Burning/pain with urination?: No Get up at night to urinate?: No Leakage of urine?: No Urine stream starts and stops?: No Trouble starting stream?: No Do you have to strain to urinate?: No Blood in urine?: No Urinary tract infection?: No Sexually transmitted disease?: No Injury to kidneys or bladder?: No Painful intercourse?: No Weak stream?: No Erection problems?: No Penile pain?: No  Gastrointestinal Nausea?: No Vomiting?: No Indigestion/heartburn?: No Diarrhea?: No Constipation?: No  Constitutional Fever: No Night sweats?: No Weight loss?: No Fatigue?: No  Skin Skin rash/lesions?: No Itching?: No  Eyes Blurred vision?: No Double vision?: No  Ears/Nose/Throat Sore throat?: No Sinus problems?: No  Hematologic/Lymphatic Swollen glands?: No Easy bruising?: No  Cardiovascular Leg swelling?: No Chest pain?: No  Respiratory Cough?: No Shortness of breath?: No  Endocrine Excessive thirst?: No  Musculoskeletal Back pain?: No Joint pain?: No  Neurological Headaches?: No Dizziness?: No  Psychologic Depression?: No Anxiety?: No  Physical Exam: BP (!) 151/80   Pulse 71   Wt 182 lb (82.6 kg)   BMI 26.11 kg/m   Constitutional: Well nourished. Alert and oriented, No acute distress. HEENT: [place] AT, moist mucus membranes. Trachea midline, no masses. Cardiovascular: No clubbing, cyanosis, or edema. Respiratory: Normal respiratory effort, no increased work of breathing. GI: Abdomen is soft, non tender, non distended, no abdominal  masses. Liver and spleen not palpable.  No hernias appreciated.  Stool sample for occult testing is not indicated.   GU: No CVA tenderness.  No bladder fullness or masses.  Patient with circumcised phallus.  Urethral meatus is patent.  No penile discharge. No penile lesions or rashes. Scrotum without lesions, cysts, rashes and/or edema.  Testicles are located scrotally bilaterally. No masses are appreciated in the testicles. Left and right epididymis are normal. Rectal: Patient with  normal  sphincter tone. Anus and perineum without scarring or rashes. No rectal masses are appreciated. Prostate is approximately 35 grams, no nodules are appreciated. Seminal vesicles are normal. Skin: No rashes, bruises or suspicious lesions. Lymph: No cervical or inguinal adenopathy. Neurologic: Grossly intact, no focal deficits, moving all 4 extremities. Psychiatric: Normal mood and affect.  Laboratory Data: Lab Results  Component Value Date   CREATININE 0.99 08/20/2017   PSA  0.5 ng/mL on 10/31/2015          0.8 ng/mL on 08/2017  I have reviewed the labs.  Assessment & Plan:    1. BPH with LUTS  - IPSS score is 1/1, it is stable  - Continue conservative management, avoiding bladder irritants and timed voiding's  - He will discontinue finasteride 5 mg daily  - RTC in 12 months for IPSS and exam   Return in about 1 year (around 02/02/2019) for I PSS and exam .  Zara Council, [facility]  [place] [address] [place], [place] [address] [phone]

## 2018-02-03 ENCOUNTER — Telehealth: Payer: Self-pay | Admitting: Family Medicine

## 2018-02-03 NOTE — Telephone Encounter (Signed)
Left Message - AWV-I.EPIC shows last AWV as 08/18/16, but OV notes doesn't show it was done.Only showed CPE done. Pt. called back at 11:20 AM and scheduled appt. for 02/08/18 @ 2:00 PM. [facility]

## 2018-02-03 NOTE — Telephone Encounter (Signed)
Left Message - AWV-I.EPIC shows last AWV as 08/18/16, but OV notes doesn't show it was done.Only showed CPE done. Pt. called back at 11:20 AM and scheduled appt. for 02/08/18 @ 2:00 PM. [facility]    By Sherren Kerns

## 2018-02-08 ENCOUNTER — Ambulatory Visit (INDEPENDENT_AMBULATORY_CARE_PROVIDER_SITE_OTHER): Payer: Medicare PPO

## 2018-02-08 ENCOUNTER — Ambulatory Visit: Payer: Medicare PPO

## 2018-02-08 VITALS — BP 138/78 | HR 64 | Temp 97.5°F | Ht 70.0 in | Wt 183.0 lb

## 2018-02-08 DIAGNOSIS — Z0000 Encounter for general adult medical examination without abnormal findings: Secondary | ICD-10-CM

## 2018-02-08 DIAGNOSIS — R29898 Other symptoms and signs involving the musculoskeletal system: Secondary | ICD-10-CM | POA: Diagnosis not present

## 2018-02-08 DIAGNOSIS — M6281 Muscle weakness (generalized): Secondary | ICD-10-CM | POA: Diagnosis not present

## 2018-02-08 DIAGNOSIS — M25611 Stiffness of right shoulder, not elsewhere classified: Secondary | ICD-10-CM | POA: Diagnosis not present

## 2018-02-08 DIAGNOSIS — M25511 Pain in right shoulder: Secondary | ICD-10-CM | POA: Diagnosis not present

## 2018-02-08 NOTE — Patient Instructions (Signed)
[name] , Thank you for taking time to come for your Medicare Wellness Visit. I appreciate your ongoing commitment to your health goals. Please review the following plan we discussed and let me know if I can assist you in the future.   Screening recommendations/referrals: Recommended yearly ophthalmology/optometry visit for glaucoma screening and checkup Recommended yearly dental visit for hygiene and checkup  Vaccinations: Influenza vaccine: done 12/07/17 Pneumococcal vaccine: done 01/17/15 Tdap vaccine: done 06/27/10 Shingles vaccine: Shingrix discussed. Please contact your insurance company for coverage information.     Advanced directives: Please bring a copy of your health care power of attorney and living will to the office at your convenience.  Conditions/risks identified: Recommend continue healthy eating habits with 3-4 servings of fruits and vegetables per day.   Next appointment: Please follow up in one year for your Medicare Annual Wellness visit.    Preventive Care 76 Years and Older, Male Preventive care refers to lifestyle choices and visits with your health care provider that can promote health and wellness. What does preventive care include?  A yearly physical exam. This is also called an annual well check.  Dental exams once or twice a year.  Routine eye exams. Ask your health care provider how often you should have your eyes checked.  Personal lifestyle choices, including:  Daily care of your teeth and gums.  Regular physical activity.  Eating a healthy diet.  Avoiding tobacco and drug use.  Limiting alcohol use.  Practicing safe sex.  Taking low doses of aspirin every day.  Taking vitamin and mineral supplements as recommended by your health care provider. What happens during an annual well check? The services and screenings done by your health care provider during your annual well check will depend on your age, overall health, lifestyle risk  factors, and family history of disease. Counseling  Your health care provider may ask you questions about your:  Alcohol use.  Tobacco use.  Drug use.  Emotional well-being.  Home and relationship well-being.  Sexual activity.  Eating habits.  History of falls.  Memory and ability to understand (cognition).  Work and work Statistician. Screening  You may have the following tests or measurements:  Height, weight, and BMI.  Blood pressure.  Lipid and cholesterol levels. These may be checked every 5 years, or more frequently if you are over 59 years old.  Skin check.  Lung cancer screening. You may have this screening every year starting at age 39 if you have a 30-pack-year history of smoking and currently smoke or have quit within the past 15 years.  Fecal occult blood test (FOBT) of the stool. You may have this test every year starting at age 60.  Flexible sigmoidoscopy or colonoscopy. You may have a sigmoidoscopy every 5 years or a colonoscopy every 10 years starting at age 55.  Prostate cancer screening. Recommendations will vary depending on your family history and other risks.  Hepatitis C blood test.  Hepatitis B blood test.  Sexually transmitted disease (STD) testing.  Diabetes screening. This is done by checking your blood sugar (glucose) after you have not eaten for a while (fasting). You may have this done every 1-3 years.  Abdominal aortic aneurysm (AAA) screening. You may need this if you are a current or former smoker.  Osteoporosis. You may be screened starting at age 42 if you are at high risk. Talk with your health care provider about your test results, treatment options, and if necessary, the need for more  tests. Vaccines  Your health care provider may recommend certain vaccines, such as:  Influenza vaccine. This is recommended every year.  Tetanus, diphtheria, and acellular pertussis (Tdap, Td) vaccine. You may need a Td booster every 10  years.  Zoster vaccine. You may need this after age 66.  Pneumococcal 13-valent conjugate (PCV13) vaccine. One dose is recommended after age 62.  Pneumococcal polysaccharide (PPSV23) vaccine. One dose is recommended after age 22. Talk to your health care provider about which screenings and vaccines you need and how often you need them. This information is not intended to replace advice given to you by your health care provider. Make sure you discuss any questions you have with your health care provider. Document Released: 03/23/2015 Document Revised: 11/14/2015 Document Reviewed: 12/26/2014 Elsevier Interactive Patient Education  2017 [place] Prevention in the Home Falls can cause injuries. They can happen to people of all ages. There are many things you can do to make your home safe and to help prevent falls. What can I do on the outside of my home?  Regularly fix the edges of walkways and driveways and fix any cracks.  Remove anything that might make you trip as you walk through a door, such as a raised step or threshold.  Trim any bushes or trees on the path to your home.  Use bright outdoor lighting.  Clear any walking paths of anything that might make someone trip, such as rocks or tools.  Regularly check to see if handrails are loose or broken. Make sure that both sides of any steps have handrails.  Any raised decks and porches should have guardrails on the edges.  Have any leaves, snow, or ice cleared regularly.  Use sand or salt on walking paths during winter.  Clean up any spills in your garage right away. This includes oil or grease spills. What can I do in the bathroom?  Use night lights.  Install grab bars by the toilet and in the tub and shower. Do not use towel bars as grab bars.  Use non-skid mats or decals in the tub or shower.  If you need to sit down in the shower, use a plastic, non-slip stool.  Keep the floor dry. Clean up any water that  spills on the floor as soon as it happens.  Remove soap buildup in the tub or shower regularly.  Attach bath mats securely with double-sided non-slip rug tape.  Do not have throw rugs and other things on the floor that can make you trip. What can I do in the bedroom?  Use night lights.  Make sure that you have a light by your bed that is easy to reach.  Do not use any sheets or blankets that are too big for your bed. They should not hang down onto the floor.  Have a firm chair that has side arms. You can use this for support while you get dressed.  Do not have throw rugs and other things on the floor that can make you trip. What can I do in the kitchen?  Clean up any spills right away.  Avoid walking on wet floors.  Keep items that you use a lot in easy-to-reach places.  If you need to reach something above you, use a strong step stool that has a grab bar.  Keep electrical cords out of the way.  Do not use floor polish or wax that makes floors slippery. If you must use wax, use non-skid floor  wax.  Do not have throw rugs and other things on the floor that can make you trip. What can I do with my stairs?  Do not leave any items on the stairs.  Make sure that there are handrails on both sides of the stairs and use them. Fix handrails that are broken or loose. Make sure that handrails are as long as the stairways.  Check any carpeting to make sure that it is firmly attached to the stairs. Fix any carpet that is loose or worn.  Avoid having throw rugs at the top or bottom of the stairs. If you do have throw rugs, attach them to the floor with carpet tape.  Make sure that you have a light switch at the top of the stairs and the bottom of the stairs. If you do not have them, ask someone to add them for you. What else can I do to help prevent falls?  Wear shoes that:  Do not have high heels.  Have rubber bottoms.  Are comfortable and fit you well.  Are closed at the  toe. Do not wear sandals.  If you use a stepladder:  Make sure that it is fully opened. Do not climb a closed stepladder.  Make sure that both sides of the stepladder are locked into place.  Ask someone to hold it for you, if possible.  Clearly mark and make sure that you can see:  Any grab bars or handrails.  First and last steps.  Where the edge of each step is.  Use tools that help you move around (mobility aids) if they are needed. These include:  Canes.  Walkers.  Scooters.  Crutches.  Turn on the lights when you go into a dark area. Replace any light bulbs as soon as they burn out.  Set up your furniture so you have a clear path. Avoid moving your furniture around.  If any of your floors are uneven, fix them.  If there are any pets around you, be aware of where they are.  Review your medicines with your doctor. Some medicines can make you feel dizzy. This can increase your chance of falling. Ask your doctor what other things that you can do to help prevent falls. This information is not intended to replace advice given to you by your health care provider. Make sure you discuss any questions you have with your health care provider. Document Released: 12/21/2008 Document Revised: 08/02/2015 Document Reviewed: 03/31/2014 Elsevier Interactive Patient Education  2017 [employer].

## 2018-02-08 NOTE — Progress Notes (Addendum)
Subjective:   Marcus Delacruz is a 77 y.o. male who presents for Medicare Annual/Subsequent preventive examination.  Review of Systems:   Cardiac Risk Factors include: dyslipidemia;hypertension;male gender, advanced age male > 34     Objective:    Vitals: BP 138/78 (BP Location: Left Arm, Patient Position: Sitting, Cuff Size: Normal)   Pulse 64   Temp (!) 97.5 F (36.4 C) (Oral)   Ht 5\' 10"  (1.778 m)   Wt 183 lb (83 kg)   BMI 26.26 kg/m   Body mass index is 26.26 kg/m.  Advanced Directives 02/08/2018 04/03/2016 01/17/2015  Does Patient Have a Medical Advance Directive? Yes Yes Yes  Type of Paramedic of [place];Living will [place];Living will Living will;Healthcare Power of [place] in Chart? No - copy requested - No - copy requested    Tobacco Social History   Tobacco Use  Smoking Status Former Smoker  . Packs/day: 1.00  . Years: 20.00  . Pack years: 20.00  . Types: Cigarettes  . Last attempt to quit: 08/09/1983  . Years since quitting: 34.5  Smokeless Tobacco Never Used  Tobacco Comment   quit smoking over 40 years ago     Counseling given: Not Answered Comment: quit smoking over 40 years ago   Clinical Intake:  Pre-visit preparation completed: Yes  Pain : No/denies pain     Nutritional Status: BMI 25 -29 Overweight Nutritional Risks: None Diabetes: No  How often do you need to have someone help you when you read instructions, pamphlets, or other written materials from your doctor or pharmacy?: 1 - Never What is the last grade level you completed in school?: associates degree  Interpreter Needed?: No  Information entered by :: Clemetine Marker LPN  Past Medical History:  Diagnosis Date  . Allergic rhinitis due to pollen   . Asthma   . BPH (benign prostatic hyperplasia)   . Cardiomegaly   . Chronic obstructive pulmonary disease (COPD) ([place])   . Hypercholesteremia    . Hypersomnia, unspecified   . Hypertension   . Obstructive sleep apnea (adult) (pediatric)   . Shortness of breath   . Snoring   . Solitary pulmonary nodule    Past Surgical History:  Procedure Laterality Date  . ARTERY REPAIR    . BACK SURGERY    . CHOLECYSTECTOMY    . COLONOSCOPY  2013   cleared  . HERNIA REPAIR     Family History  Problem Relation Age of Onset  . COPD Mother   . Asthma Son   . Cancer Father        lung  . Prostate cancer Neg Hx   . Kidney disease Neg Hx    Social History   Socioeconomic History  . Marital status: Married    Spouse name: Not on file  . Number of children: 3  . Years of education: Not on file  . Highest education level: Associate degree: academic program  Occupational History  . Occupation: retired  Scientific laboratory technician  . Financial resource strain: Not hard at all  . Food insecurity:    Worry: Never true    Inability: Never true  . Transportation needs:    Medical: No    Non-medical: No  Tobacco Use  . Smoking status: Former Smoker    Packs/day: 1.00    Years: 20.00    Pack years: 20.00    Types: Cigarettes    Last attempt  to quit: 08/09/1983    Years since quitting: 34.5  . Smokeless tobacco: Never Used  . Tobacco comment: quit smoking over 40 years ago  Substance and Sexual Activity  . Alcohol use: Yes    Alcohol/week: 0.0 standard drinks    Comment: once monthly  . Drug use: No  . Sexual activity: Not on file  Lifestyle  . Physical activity:    Days per week: 7 days    Minutes per session: 30 min  . Stress: Patient refused  Relationships  . Social connections:    Talks on phone: More than three times a week    Gets together: More than three times a week    Attends religious service: More than 4 times per year    Active member of club or organization: No    Attends meetings of clubs or organizations: Never    Relationship status: Married  Other Topics Concern  . Not on file  Social History Narrative  . Not on  file    Outpatient Encounter Medications as of 02/08/2018  Medication Sig  . aspirin 81 MG tablet Take 81 mg by mouth daily.  Marland Kitchen atorvastatin (LIPITOR) 40 MG tablet TAKE (1) TABLET BY MOUTH EVERY DAY  . finasteride (PROSCAR) 5 MG tablet Take 1 tablet (5 mg total) daily by mouth. (Patient taking differently: Take 5 mg by mouth daily. Zara Council)  . Fluticasone-Umeclidin-Vilant (TRELEGY ELLIPTA) 100-62.5-25 MCG/INH AEPB Inhale 1 puff into the lungs daily.  Marland Kitchen ibuprofen (ADVIL,MOTRIN) 800 MG tablet TAKE (1) TABLET BY MOUTH THREE TIMES A DAY  . lisinopril-hydrochlorothiazide (PRINZIDE,ZESTORETIC) 10-12.5 MG tablet 1 a day  . montelukast (SINGULAIR) 10 MG tablet TAKE (1) TABLET BY MOUTH EVERY DAY  . albuterol (PROVENTIL HFA;VENTOLIN HFA) 108 (90 Base) MCG/ACT inhaler Inhale 2 puffs into the lungs every 6 (six) hours as needed for wheezing or shortness of breath. (Patient not taking: Reported on 02/08/2018)  . ALLERGY RELIEF 180 MG tablet TAKE (1) TABLET BY MOUTH EVERY DAY (Patient not taking: Reported on 02/08/2018)  . ipratropium-albuterol (DUONEB) 0.5-2.5 (3) MG/3ML SOLN TAKE 3MLS BY NEBULIZATION EVERY 6 HOURS AS NEEDED (Patient not taking: Reported on 02/08/2018)  . meloxicam (MOBIC) 15 MG tablet Take 1 tablet (15 mg total) by mouth daily. (Patient not taking: Reported on 02/08/2018)   Facility-Administered Encounter Medications as of 02/08/2018  Medication  . ipratropium-albuterol (DUONEB) 0.5-2.5 (3) MG/3ML nebulizer solution 3 mL  . ipratropium-albuterol (DUONEB) 0.5-2.5 (3) MG/3ML nebulizer solution 3 mL    Activities of Daily Living In your present state of health, do you have any difficulty performing the following activities: 02/08/2018  Hearing? N  Comment slight hearing difficulty, declines hearing aids  Vision? N  Comment wears reading glasses  Difficulty concentrating or making decisions? N  Walking or climbing stairs? N  Dressing or bathing? N  Doing errands, shopping? N    Preparing Food and eating ? N  Using the Toilet? N  In the past six months, have you accidently leaked urine? N  Do you have problems with loss of bowel control? N  Managing your Medications? N  Managing your Finances? N  Housekeeping or managing your Housekeeping? N  Some recent data might be hidden    Patient Care Team: Juline Patch, MD as PCP - General (Family Medicine)   Assessment:   This is a routine wellness examination for Deondrea.  Exercise Activities and Dietary recommendations Current Exercise Habits: Home exercise routine, Type of exercise: walking, Time (Minutes): 30,  Frequency (Times/Week): 7, Weekly Exercise (Minutes/Week): 210, Intensity: Mild, Exercise limited by: respiratory conditions(s)  Goals    . DIET - EAT MORE FRUITS AND VEGETABLES     Continue healthy eating habits with 3-4 servings of fruits and vegetables per day.       Fall Risk Fall Risk  02/08/2018 03/23/2017 08/18/2016 11/05/2015  Falls in the past year? 1 No No No  Comment pt slipped when getting out of his truck  - - [employer] Survey: data to providers prior to load  Number falls in past yr: 0 - - -  Injury with Fall? 1 - - -  Comment slight tear in shoulder, completing physical therapy - - -  Follow up Falls prevention discussed - - -   FALL RISK PREVENTION PERTAINING TO THE HOME:  Any stairs in or around the home WITH handrails? No  Home free of loose throw rugs in walkways, pet beds, electrical cords, etc? Yes  Adequate lighting in your home to reduce risk of falls? Yes   ASSISTIVE DEVICES UTILIZED TO PREVENT FALLS:  Life alert? No  Use of a cane, walker or w/c? No  Grab bars in the bathroom? No  Shower chair or bench in shower? No  Elevated toilet seat or a handicapped toilet? No   DME ORDERS:  DME order needed?  No   TIMED UP AND GO:  Was the test performed? Yes .  Length of time to ambulate 10 feet: 5 sec.   GAIT:  Appearance of gait: Gait stead-fast and without  the use of an assistive device.  Education: Fall risk prevention has been discussed.  Intervention(s) required? No    Depression Screen PHQ 2/9 Scores 02/08/2018 03/23/2017 08/18/2016  PHQ - 2 Score 0 0 0    Cognitive Function Pt declined 6CIT        Immunization History  Administered Date(s) Administered  . Influenza Split 12/08/2012  . Influenza, High Dose Seasonal PF 12/07/2017  . Influenza,inj,Quad PF,6+ Mos 01/06/2013, 11/23/2013  . Influenza,inj,quad, With Preservative 12/24/2016  . Influenza-Unspecified 01/07/2015, 01/21/2017  . Pneumococcal Conjugate-13 01/17/2015  . Pneumococcal Polysaccharide-23 06/27/2010  . Tdap 06/27/2010    Qualifies for Shingles Vaccine? Yes . Due for Shingrix. Education has been provided regarding the importance of this vaccine. Pt has been advised to call insurance company to determine out of pocket expense. Advised may also receive vaccine at local pharmacy or Health Dept. Verbalized acceptance and understanding.  Tdap: Up to date   Flu Vaccine: Up to date   Pneumococcal Vaccine: Up to date   Screening Tests Health Maintenance  Topic Date Due  . TETANUS/TDAP  06/26/2020  . INFLUENZA VACCINE  Completed  . PNA vac Low Risk Adult  Completed     Cancer Screenings:  Colorectal Screening: No longer required.   Lung Cancer Screening: (Low Dose CT Chest recommended if Age 15-80 years, 30 pack-year currently smoking OR have quit w/in 15years.) does not qualify.    Additional Screening:  Hepatitis C Screening: no longer required  Vision Screening: Recommended annual ophthalmology exams for early detection of glaucoma and other disorders of the eye. Is the patient up to date with their annual eye exam?  Yes  Who is the provider or what is the name of the office in which the pt attends annual eye exams? [facility] Screening: Recommended annual dental exams for proper oral hygiene  Community Resource Referral:  CRR  required this visit?  No  Plan:    I have personally reviewed and addressed the Medicare Annual Wellness questionnaire and have noted the following in the patient's chart:  A. Medical and social history B. Use of alcohol, tobacco or illicit drugs  C. Current medications and supplements D. Functional ability and status E.  Nutritional status F.  Physical activity G. Advance directives H. List of other physicians I.  Hospitalizations, surgeries, and ER visits in previous 12 months J.  [place] such as hearing and vision if needed, cognitive and depression L. Referrals and appointments   In addition, I have reviewed and discussed with patient certain preventive protocols, quality metrics, and best practice recommendations. A written personalized care plan for preventive services as well as general preventive health recommendations were provided to patient.   Signed,  Clemetine Marker, LPN Nurse Health Advisor   Nurse Notes: none

## 2018-02-10 DIAGNOSIS — M25611 Stiffness of right shoulder, not elsewhere classified: Secondary | ICD-10-CM | POA: Diagnosis not present

## 2018-02-10 DIAGNOSIS — M25511 Pain in right shoulder: Secondary | ICD-10-CM | POA: Diagnosis not present

## 2018-02-10 DIAGNOSIS — M6281 Muscle weakness (generalized): Secondary | ICD-10-CM | POA: Diagnosis not present

## 2018-02-10 DIAGNOSIS — R29898 Other symptoms and signs involving the musculoskeletal system: Secondary | ICD-10-CM | POA: Diagnosis not present

## 2018-02-11 ENCOUNTER — Encounter: Payer: Self-pay | Admitting: Internal Medicine

## 2018-02-11 ENCOUNTER — Ambulatory Visit: Payer: Medicare PPO | Admitting: Internal Medicine

## 2018-02-11 VITALS — BP 136/52 | HR 70 | Resp 16 | Ht 70.0 in | Wt 184.0 lb

## 2018-02-11 DIAGNOSIS — Z9989 Dependence on other enabling machines and devices: Secondary | ICD-10-CM

## 2018-02-11 DIAGNOSIS — G4733 Obstructive sleep apnea (adult) (pediatric): Secondary | ICD-10-CM | POA: Diagnosis not present

## 2018-02-11 DIAGNOSIS — R0602 Shortness of breath: Secondary | ICD-10-CM | POA: Diagnosis not present

## 2018-02-11 DIAGNOSIS — Z9114 Patient's other noncompliance with medication regimen: Secondary | ICD-10-CM | POA: Diagnosis not present

## 2018-02-11 DIAGNOSIS — I10 Essential (primary) hypertension: Secondary | ICD-10-CM | POA: Diagnosis not present

## 2018-02-11 DIAGNOSIS — J449 Chronic obstructive pulmonary disease, unspecified: Secondary | ICD-10-CM | POA: Diagnosis not present

## 2018-02-11 NOTE — Progress Notes (Signed)
[facility] [place], [place] [address]  Pulmonary Sleep Medicine   Office Visit Note  Patient Name: Marcus Delacruz DOB: 1941-01-10 MRN [id number]  Date of Service: 02/11/2018  Complaints/HPI: Patient is here for follow-up on COPD and OSA.  He denies any recent hospital admissions or illness.  Generally he is doing well and has no complaints.  He continues to wear his CPAP nightly and states he cannot sleep without it.  He reports he has a few months a year where his breathing gets worse and he has been periodically using his trilogy during those months.  Since he has bypassed that season he has stopped using his Trelegy.  We discussed medication compliance and how daily use his medication is the most appropriate therapy.  ROS  General: (-) fever, (-) chills, (-) night sweats, (-) weakness Skin: (-) rashes, (-) itching,. Eyes: (-) visual changes, (-) redness, (-) itching. Nose and Sinuses: (-) nasal stuffiness or itchiness, (-) postnasal drip, (-) nosebleeds, (-) sinus trouble. Mouth and Throat: (-) sore throat, (-) hoarseness. Neck: (-) swollen glands, (-) enlarged thyroid, (-) neck pain. Respiratory: - cough, (-) bloody sputum, - shortness of breath, - wheezing. Cardiovascular: - ankle swelling, (-) chest pain. Lymphatic: (-) lymph node enlargement. Neurologic: (-) numbness, (-) tingling. Psychiatric: (-) anxiety, (-) depression   Current Medication: Outpatient Encounter Medications as of 02/11/2018  Medication Sig  . aspirin 81 MG tablet Take 81 mg by mouth daily.  Marland Kitchen atorvastatin (LIPITOR) 40 MG tablet TAKE (1) TABLET BY MOUTH EVERY DAY  . Fluticasone-Umeclidin-Vilant (TRELEGY ELLIPTA) 100-62.5-25 MCG/INH AEPB Inhale 1 puff into the lungs daily.  Marland Kitchen ibuprofen (ADVIL,MOTRIN) 800 MG tablet TAKE (1) TABLET BY MOUTH THREE TIMES A DAY  . ipratropium-albuterol (DUONEB) 0.5-2.5 (3) MG/3ML SOLN TAKE 3MLS BY NEBULIZATION EVERY 6 HOURS AS NEEDED  .  lisinopril-hydrochlorothiazide (PRINZIDE,ZESTORETIC) 10-12.5 MG tablet 1 a day  . meloxicam (MOBIC) 15 MG tablet Take 1 tablet (15 mg total) by mouth daily.  . montelukast (SINGULAIR) 10 MG tablet TAKE (1) TABLET BY MOUTH EVERY DAY  . albuterol (PROVENTIL HFA;VENTOLIN HFA) 108 (90 Base) MCG/ACT inhaler Inhale 2 puffs into the lungs every 6 (six) hours as needed for wheezing or shortness of breath. (Patient not taking: Reported on 02/08/2018)  . ALLERGY RELIEF 180 MG tablet TAKE (1) TABLET BY MOUTH EVERY DAY (Patient not taking: Reported on 02/08/2018)  . finasteride (PROSCAR) 5 MG tablet Take 1 tablet (5 mg total) daily by mouth. (Patient not taking: Reported on 02/11/2018)   Facility-Administered Encounter Medications as of 02/11/2018  Medication  . ipratropium-albuterol (DUONEB) 0.5-2.5 (3) MG/3ML nebulizer solution 3 mL  . ipratropium-albuterol (DUONEB) 0.5-2.5 (3) MG/3ML nebulizer solution 3 mL    Surgical History: Past Surgical History:  Procedure Laterality Date  . ARTERY REPAIR    . BACK SURGERY    . CHOLECYSTECTOMY    . COLONOSCOPY  2013   cleared  . HERNIA REPAIR      Medical History: Past Medical History:  Diagnosis Date  . Allergic rhinitis due to pollen   . Asthma   . BPH (benign prostatic hyperplasia)   . Cardiomegaly   . Chronic obstructive pulmonary disease (COPD) ([place])   . Hypercholesteremia   . Hypersomnia, unspecified   . Hypertension   . Obstructive sleep apnea (adult) (pediatric)   . Shortness of breath   . Snoring   . Solitary pulmonary nodule     Family History: Family History  Problem Relation Age of  Onset  . COPD Mother   . Asthma Son   . Cancer Father        lung  . Prostate cancer Neg Hx   . Kidney disease Neg Hx     Social History: Social History   Socioeconomic History  . Marital status: Married    Spouse name: Not on file  . Number of children: 3  . Years of education: Not on file  . Highest education level: Associate degree:  academic program  Occupational History  . Occupation: retired  Scientific laboratory technician  . Financial resource strain: Not hard at all  . Food insecurity:    Worry: Never true    Inability: Never true  . Transportation needs:    Medical: No    Non-medical: No  Tobacco Use  . Smoking status: Former Smoker    Packs/day: 1.00    Years: 20.00    Pack years: 20.00    Types: Cigarettes    Last attempt to quit: 08/09/1983    Years since quitting: 34.5  . Smokeless tobacco: Never Used  . Tobacco comment: quit smoking over 40 years ago  Substance and Sexual Activity  . Alcohol use: Yes    Alcohol/week: 0.0 standard drinks    Comment: once monthly  . Drug use: No  . Sexual activity: Not on file  Lifestyle  . Physical activity:    Days per week: 7 days    Minutes per session: 30 min  . Stress: Patient refused  Relationships  . Social connections:    Talks on phone: More than three times a week    Gets together: More than three times a week    Attends religious service: More than 4 times per year    Active member of club or organization: No    Attends meetings of clubs or organizations: Never    Relationship status: Married  . Intimate partner violence:    Fear of current or ex partner: No    Emotionally abused: No    Physically abused: No    Forced sexual activity: No  Other Topics Concern  . Not on file  Social History Narrative  . Not on file    Vital Signs: Blood pressure (!) 136/52, pulse 70, resp. rate 16, height 5\' 10"  (1.778 m), weight 184 lb (83.5 kg), SpO2 97 %.  Examination: General Appearance: The patient is well-developed, well-nourished, and in no distress. Skin: Gross inspection of skin unremarkable. Head: normocephalic, no gross deformities. Eyes: no gross deformities noted. ENT: ears appear grossly normal no exudates. Neck: Supple. No thyromegaly. No LAD. Respiratory: clear to auscultation. Cardiovascular: Normal S1 and S2 without murmur or rub. Extremities: No  cyanosis. pulses are equal. Neurologic: Alert and oriented. No involuntary movements.  LABS: Recent Results (from the past 2160 hour(s))  Bladder Scan (Post Void Residual) in office     Status: None   Collection Time: 02/01/18  9:38 AM  Result Value Ref Range   Scan Result 29ml     Radiology: Dg Shoulder Right  Result Date: 01/08/2018 CLINICAL DATA:  persistent right shoulder pain EXAM: RIGHT SHOULDER - 2+ VIEW COMPARISON:  None. FINDINGS: No acute fracture. No dislocation.  Unremarkable soft tissues. IMPRESSION: No acute bony pathology. Electronically Signed   By: Marybelle Killings M.D.   On: 01/08/2018 07:16    No results found.  No results found.    Assessment and Plan: Patient Active Problem List   Diagnosis Date Noted  . Chronic obstructive pulmonary  disease with acute exacerbation ([place]) 07/27/2017  . Asthma, chronic, severe persistent, uncomplicated 68/34/1962  . Seasonal allergic rhinitis due to pollen 08/18/2016  . Bilateral carotid artery stenosis 04/03/2016  . Bilateral hydrocele 11/22/2014  . BPH with obstruction/lower urinary tract symptoms 10/05/2014  . Epididymal cyst 10/05/2014  . Familial multiple lipoprotein-type hyperlipidemia 07/14/2014  . Laboratory animal allergy 07/14/2014  . Acute arthropathy 07/14/2014  . Routine general medical examination at a health care facility 07/14/2014  . Asthma with exacerbation 07/14/2014  . Essential (primary) hypertension 07/14/2014  . Screening for depression 07/14/2014  . Noncompliance w/medication treatment due to intermit use of medication 11/23/2013  . Asthma, chronic 08/08/2013   1. Chronic obstructive pulmonary disease, unspecified COPD type ([place]) Stable, was again stressed to patient that using his Trelegy inhaler daily is the best way to control his disease symptoms.  Patient verbalized understanding.  2. OSA on CPAP Patient should continue to use CPAP nightly.  He will continue to clean his machine and change his  filter and tubing as indicated.  He reports 3 years without missing a night of wearing his CPAP.  3. Noncompliance w/medication treatment due to intermit use of medication We had a long discussion about compliance of medications specifically for his COPD.  I assured him that his Trelegy inhaler was not to used as needed but 1 puff daily and he said he would try to do better.  He states in the months that he feels well it is really hard to motivate self to use the medication.  I explained to him that once he was feeling bad it was too late in the medicine one half to catch up to his symptoms.  If he would take the medication daily it would control the symptoms around.  4. SOB (shortness of breath) - Spirometry with Graph  5. Essential (primary) hypertension Stable patient's pressure today 136/52.   General Counseling: I have discussed the findings of the evaluation and examination with Angelica.  I have also discussed any further diagnostic evaluation thatmay be needed or ordered today. Terrace verbalizes understanding of the findings of todays visit. We also reviewed his medications today and discussed drug interactions and side effects including but not limited excessive drowsiness and altered mental states. We also discussed that there is always a risk not just to him but also people around him. he has been encouraged to call the office with any questions or concerns that should arise related to todays visit.    Time spent:  25  I have personally obtained a history, examined the patient, evaluated laboratory and imaging results, formulated the assessment and plan and placed orders.    Allyne Gee, MD [facility] Pulmonary and Critical Care Sleep medicine

## 2018-02-11 NOTE — Patient Instructions (Signed)

## 2018-02-19 ENCOUNTER — Ambulatory Visit: Payer: Medicare PPO | Admitting: Family Medicine

## 2018-02-19 ENCOUNTER — Encounter: Payer: Self-pay | Admitting: Family Medicine

## 2018-02-19 VITALS — BP 124/70 | HR 72 | Ht 70.0 in | Wt 182.0 lb

## 2018-02-19 DIAGNOSIS — I10 Essential (primary) hypertension: Secondary | ICD-10-CM

## 2018-02-19 DIAGNOSIS — J4550 Severe persistent asthma, uncomplicated: Secondary | ICD-10-CM | POA: Diagnosis not present

## 2018-02-19 DIAGNOSIS — E7849 Other hyperlipidemia: Secondary | ICD-10-CM | POA: Diagnosis not present

## 2018-02-19 MED ORDER — LISINOPRIL-HYDROCHLOROTHIAZIDE 10-12.5 MG PO TABS: ORAL_TABLET | ORAL | 1 refills | Status: DC

## 2018-02-19 MED ORDER — ALBUTEROL SULFATE HFA 108 (90 BASE) MCG/ACT IN AERS: 2.0000 | INHALATION_SPRAY | Freq: Four times a day (QID) | RESPIRATORY_TRACT | 11 refills | Status: DC | PRN

## 2018-02-19 MED ORDER — ATORVASTATIN CALCIUM 40 MG PO TABS: ORAL_TABLET | ORAL | 1 refills | Status: DC

## 2018-02-19 NOTE — Progress Notes (Signed)
Date:  02/19/2018   Name:  Marcus Delacruz   DOB:  Apr 28, 1940   MRN:  [id number]   Chief Complaint: Hyperlipidemia; Hypertension; Allergic Rhinitis ; and Asthma  Hyperlipidemia  This is a chronic problem. The current episode started more than 1 year ago. The problem is controlled. Recent lipid tests were reviewed and are normal. He has no history of chronic renal disease, diabetes, hypothyroidism, liver disease, obesity or nephrotic syndrome. There are no known factors aggravating his hyperlipidemia. Pertinent negatives include no chest pain, focal sensory loss, focal weakness, leg pain, myalgias or shortness of breath. He is currently on no antihyperlipidemic treatment. The current treatment provides mild improvement of lipids. There are no compliance problems.  Risk factors for coronary artery disease include dyslipidemia, hypertension and post-menopausal.  Hypertension  This is a chronic problem. The current episode started more than 1 year ago. The problem has been waxing and waning since onset. The problem is controlled. Pertinent negatives include no anxiety, blurred vision, chest pain, headaches, malaise/fatigue, neck pain, orthopnea, palpitations, peripheral edema, PND, shortness of breath or sweats. There are no associated agents to hypertension. Risk factors for coronary artery disease include dyslipidemia. Past treatments include ACE inhibitors and diuretics. The current treatment provides moderate improvement. There are no compliance problems.  There is no history of angina, kidney disease, CAD/MI, CVA, heart failure, left ventricular hypertrophy, PVD or retinopathy. There is no history of chronic renal disease, a hypertension causing med or renovascular disease.  Asthma  There is no chest tightness, cough, difficulty breathing, frequent throat clearing, hemoptysis, hoarse voice, shortness of breath or wheezing. This is a new problem. The current episode started more than 1 year ago.  The problem has been rapidly improving. Pertinent negatives include no appetite change, chest pain, dyspnea on exertion, ear congestion, ear pain, fever, headaches, heartburn, malaise/fatigue, myalgias, nasal congestion, orthopnea, PND, postnasal drip, rhinorrhea, sneezing, sore throat, sweats, trouble swallowing or weight loss. His past medical history is significant for asthma.    Review of Systems  Constitutional: Negative for appetite change, chills, fever, malaise/fatigue and weight loss.  HENT: Negative for drooling, ear discharge, ear pain, hoarse voice, postnasal drip, rhinorrhea, sneezing, sore throat and trouble swallowing.   Eyes: Negative for blurred vision.  Respiratory: Negative for cough, hemoptysis, shortness of breath and wheezing.   Cardiovascular: Negative for chest pain, dyspnea on exertion, palpitations, orthopnea, leg swelling and PND.  Gastrointestinal: Negative for abdominal pain, blood in stool, constipation, diarrhea, heartburn and nausea.  Endocrine: Negative for polydipsia.  Genitourinary: Negative for dysuria, frequency, hematuria and urgency.  Musculoskeletal: Negative for back pain, myalgias and neck pain.  Skin: Negative for rash.  Allergic/Immunologic: Negative for environmental allergies.  Neurological: Negative for dizziness, focal weakness and headaches.  Hematological: Does not bruise/bleed easily.  Psychiatric/Behavioral: Negative for suicidal ideas. The patient is not nervous/anxious.     Patient Active Problem List   Diagnosis Date Noted  . Chronic obstructive pulmonary disease with acute exacerbation ([place]) 07/27/2017  . Asthma, chronic, severe persistent, uncomplicated 38/18/2993  . Seasonal allergic rhinitis due to pollen 08/18/2016  . Bilateral carotid artery stenosis 04/03/2016  . Bilateral hydrocele 11/22/2014  . BPH with obstruction/lower urinary tract symptoms 10/05/2014  . Epididymal cyst 10/05/2014  . Familial multiple lipoprotein-type  hyperlipidemia 07/14/2014  . Laboratory animal allergy 07/14/2014  . Acute arthropathy 07/14/2014  . Routine general medical examination at a health care facility 07/14/2014  . Asthma with exacerbation 07/14/2014  . Essential (primary) hypertension 07/14/2014  .  Screening for depression 07/14/2014  . Noncompliance w/medication treatment due to intermit use of medication 11/23/2013  . Asthma, chronic 08/08/2013    Allergies  Allergen Reactions  . Codeine Itching    Past Surgical History:  Procedure Laterality Date  . ARTERY REPAIR    . BACK SURGERY    . CHOLECYSTECTOMY    . COLONOSCOPY  2013   cleared  . HERNIA REPAIR      Social History   Tobacco Use  . Smoking status: Former Smoker    Packs/day: 1.00    Years: 20.00    Pack years: 20.00    Types: Cigarettes    Last attempt to quit: 08/09/1983    Years since quitting: 34.5  . Smokeless tobacco: Never Used  . Tobacco comment: quit smoking over 40 years ago  Substance Use Topics  . Alcohol use: Yes    Alcohol/week: 0.0 standard drinks    Comment: once monthly  . Drug use: No     Medication list has been reviewed and updated.  Current Meds  Medication Sig  . albuterol (PROVENTIL HFA;VENTOLIN HFA) 108 (90 Base) MCG/ACT inhaler Inhale 2 puffs into the lungs every 6 (six) hours as needed for wheezing or shortness of breath.  . ALLERGY RELIEF 180 MG tablet TAKE (1) TABLET BY MOUTH EVERY DAY  . aspirin 81 MG tablet Take 81 mg by mouth daily.  Marland Kitchen atorvastatin (LIPITOR) 40 MG tablet TAKE (1) TABLET BY MOUTH EVERY DAY  . finasteride (PROSCAR) 5 MG tablet Take 1 tablet (5 mg total) daily by mouth.  . Fluticasone-Umeclidin-Vilant (TRELEGY ELLIPTA) 100-62.5-25 MCG/INH AEPB Inhale 1 puff into the lungs daily.  Marland Kitchen ibuprofen (ADVIL,MOTRIN) 800 MG tablet TAKE (1) TABLET BY MOUTH THREE TIMES A DAY  . ipratropium-albuterol (DUONEB) 0.5-2.5 (3) MG/3ML SOLN TAKE 3MLS BY NEBULIZATION EVERY 6 HOURS AS NEEDED  .  lisinopril-hydrochlorothiazide (PRINZIDE,ZESTORETIC) 10-12.5 MG tablet 1 a day  . montelukast (SINGULAIR) 10 MG tablet TAKE (1) TABLET BY MOUTH EVERY DAY   Current Facility-Administered Medications for the 02/19/18 encounter (Office Visit) with Juline Patch, MD  Medication  . ipratropium-albuterol (DUONEB) 0.5-2.5 (3) MG/3ML nebulizer solution 3 mL  . ipratropium-albuterol (DUONEB) 0.5-2.5 (3) MG/3ML nebulizer solution 3 mL    PHQ 2/9 Scores 02/19/2018 02/08/2018 03/23/2017 08/18/2016  PHQ - 2 Score 0 0 0 0  PHQ- 9 Score 0 - - -    Physical Exam Vitals signs and nursing note reviewed.  Constitutional:      Appearance: Normal appearance.  HENT:     Head: Normocephalic.     Right Ear: External ear normal.     Left Ear: External ear normal.     Nose: Nose normal.  Eyes:     General: No scleral icterus.       Right eye: No discharge.        Left eye: No discharge.     Conjunctiva/sclera: Conjunctivae normal.     Pupils: Pupils are equal, round, and reactive to light.  Neck:     Musculoskeletal: Normal range of motion and neck supple.     Thyroid: No thyromegaly.     Vascular: No JVD.     Trachea: No tracheal deviation.  Cardiovascular:     Rate and Rhythm: Normal rate and regular rhythm.     Heart sounds: Normal heart sounds. No murmur. No systolic murmur. No diastolic murmur. No friction rub. No gallop. No S3 or S4 sounds.   Pulmonary:     Effort: No  respiratory distress.     Breath sounds: Normal breath sounds. No decreased air movement or transmitted upper airway sounds. No decreased breath sounds, wheezing, rhonchi or rales.  Chest:     Chest wall: No mass.     Breasts:        Right: Normal.        Left: Normal.  Abdominal:     General: Bowel sounds are normal.     Palpations: Abdomen is soft. There is no mass.     Tenderness: There is no abdominal tenderness. There is no guarding or rebound.  Musculoskeletal: Normal range of motion.        General: No tenderness.      Right lower leg: No edema.     Left lower leg: No edema.  Lymphadenopathy:     Cervical: No cervical adenopathy.  Skin:    General: Skin is warm.     Findings: No rash.  Neurological:     Mental Status: He is alert and oriented to person, place, and time.     Cranial Nerves: No cranial nerve deficit.     Deep Tendon Reflexes: Reflexes are normal and symmetric.     BP 124/70   Pulse 72   Ht 5\' 10"  (1.778 m)   Wt 182 lb (82.6 kg)   BMI 26.11 kg/m   Assessment and Plan: 1. Asthma, chronic, severe persistent, uncomplicated Chronic.  Controlled.  Remittent. - albuterol (PROVENTIL HFA;VENTOLIN HFA) 108 (90 Base) MCG/ACT inhaler; Inhale 2 puffs into the lungs every 6 (six) hours as needed for wheezing or shortness of breath.  Dispense: 1 Inhaler; Refill: 11  2. Essential (primary) hypertension Chronic.  Controlled.  Continue lisinopril HCTZ 10-12 0.5.  Renal panel - lisinopril-hydrochlorothiazide (PRINZIDE,ZESTORETIC) 10-12.5 MG tablet; 1 a day  Dispense: 90 tablet; Refill: 1 - Renal Function Panel  3. Familial multiple lipoprotein-type hyperlipidemia Chronic.  Stable previous lipid panel reviewed.  Continue atorvastatin 40 mg 1 a day. - atorvastatin (LIPITOR) 40 MG tablet; TAKE (1) TABLET BY MOUTH EVERY DAY  Dispense: 90 tablet; Refill: 1

## 2018-02-20 LAB — RENAL FUNCTION PANEL
Albumin: 4.4 g/dL (ref 3.5–4.8)
BUN/Creatinine Ratio: 16 (ref 10–24)
BUN: 17 mg/dL (ref 8–27)
CO2: 25 mmol/L (ref 20–29)
Calcium: 9.4 mg/dL (ref 8.6–10.2)
Chloride: 99 mmol/L (ref 96–106)
Creatinine, Ser: 1.06 mg/dL (ref 0.76–1.27)
GFR calc Af Amer: 78 mL/min/{1.73_m2} (ref >59)
GFR calc non Af Amer: 68 mL/min/{1.73_m2} (ref >59)
GLUCOSE: 90 mg/dL (ref 65–99)
POTASSIUM: 4.5 mmol/L (ref 3.5–5.2)
Phosphorus: 3.4 mg/dL (ref 2.5–4.5)
Sodium: 139 mmol/L (ref 134–144)

## 2018-04-21 ENCOUNTER — Ambulatory Visit: Payer: Self-pay

## 2018-05-05 ENCOUNTER — Ambulatory Visit: Payer: Medicare PPO

## 2018-05-05 DIAGNOSIS — G4733 Obstructive sleep apnea (adult) (pediatric): Secondary | ICD-10-CM

## 2018-05-05 NOTE — Progress Notes (Signed)
95 percentile pressure 11   95th percentile leak 1.8   apnea index 0.5 /hr  apnea-hypopnea index  1.1 /hr   total days used  >4 hr 86 days  total days used <4 hr 0 days  Total compliance 96 percent  Patient is doing on cpap no problems with supplies. No other problems or questions at this time

## 2018-05-10 ENCOUNTER — Other Ambulatory Visit: Payer: Self-pay | Admitting: Family Medicine

## 2018-05-10 ENCOUNTER — Other Ambulatory Visit: Payer: Self-pay | Admitting: Urology

## 2018-05-10 DIAGNOSIS — J4550 Severe persistent asthma, uncomplicated: Secondary | ICD-10-CM

## 2018-05-10 DIAGNOSIS — M5136 Other intervertebral disc degeneration, lumbar region: Secondary | ICD-10-CM

## 2018-05-10 DIAGNOSIS — J301 Allergic rhinitis due to pollen: Secondary | ICD-10-CM

## 2018-05-17 ENCOUNTER — Other Ambulatory Visit: Payer: Self-pay | Admitting: Urology

## 2018-05-17 ENCOUNTER — Other Ambulatory Visit: Payer: Self-pay | Admitting: Family Medicine

## 2018-06-01 DIAGNOSIS — G4733 Obstructive sleep apnea (adult) (pediatric): Secondary | ICD-10-CM | POA: Diagnosis not present

## 2018-06-09 ENCOUNTER — Other Ambulatory Visit: Payer: Self-pay

## 2018-06-09 DIAGNOSIS — J449 Chronic obstructive pulmonary disease, unspecified: Secondary | ICD-10-CM

## 2018-06-09 MED ORDER — FLUTICASONE-UMECLIDIN-VILANT 100-62.5-25 MCG/INH IN AEPB: 1.0000 | INHALATION_SPRAY | Freq: Every day | RESPIRATORY_TRACT | 2 refills | Status: DC

## 2018-08-05 ENCOUNTER — Other Ambulatory Visit: Payer: Self-pay | Admitting: Family Medicine

## 2018-08-05 DIAGNOSIS — M5136 Other intervertebral disc degeneration, lumbar region: Secondary | ICD-10-CM

## 2018-08-05 DIAGNOSIS — J301 Allergic rhinitis due to pollen: Secondary | ICD-10-CM

## 2018-08-06 ENCOUNTER — Other Ambulatory Visit: Payer: Self-pay | Admitting: Family Medicine

## 2018-08-16 ENCOUNTER — Ambulatory Visit: Payer: Self-pay | Admitting: Internal Medicine

## 2018-08-23 ENCOUNTER — Encounter: Payer: Self-pay | Admitting: Family Medicine

## 2018-08-23 ENCOUNTER — Other Ambulatory Visit: Payer: Self-pay

## 2018-08-23 ENCOUNTER — Ambulatory Visit (INDEPENDENT_AMBULATORY_CARE_PROVIDER_SITE_OTHER): Payer: Medicare PPO | Admitting: Family Medicine

## 2018-08-23 VITALS — BP 138/60 | HR 64 | Ht 70.0 in | Wt 187.0 lb

## 2018-08-23 DIAGNOSIS — J301 Allergic rhinitis due to pollen: Secondary | ICD-10-CM

## 2018-08-23 DIAGNOSIS — Z1211 Encounter for screening for malignant neoplasm of colon: Secondary | ICD-10-CM | POA: Diagnosis not present

## 2018-08-23 DIAGNOSIS — J4550 Severe persistent asthma, uncomplicated: Secondary | ICD-10-CM | POA: Diagnosis not present

## 2018-08-23 DIAGNOSIS — I10 Essential (primary) hypertension: Secondary | ICD-10-CM

## 2018-08-23 DIAGNOSIS — Z0000 Encounter for general adult medical examination without abnormal findings: Secondary | ICD-10-CM | POA: Diagnosis not present

## 2018-08-23 DIAGNOSIS — E7849 Other hyperlipidemia: Secondary | ICD-10-CM | POA: Diagnosis not present

## 2018-08-23 DIAGNOSIS — R69 Illness, unspecified: Secondary | ICD-10-CM | POA: Diagnosis not present

## 2018-08-23 LAB — HEMOCCULT GUIAC POC 1CARD (OFFICE): Fecal Occult Blood, POC: NEGATIVE

## 2018-08-23 MED ORDER — MONTELUKAST SODIUM 10 MG PO TABS: ORAL_TABLET | ORAL | 1 refills | Status: DC

## 2018-08-23 MED ORDER — LISINOPRIL-HYDROCHLOROTHIAZIDE 10-12.5 MG PO TABS: ORAL_TABLET | ORAL | 1 refills | Status: DC

## 2018-08-23 MED ORDER — ATORVASTATIN CALCIUM 40 MG PO TABS: ORAL_TABLET | ORAL | 1 refills | Status: DC

## 2018-08-23 NOTE — Patient Instructions (Signed)

## 2018-08-23 NOTE — Progress Notes (Signed)
Date:  08/23/2018   Name:  Marcus Delacruz   DOB:  May 28, 1940   MRN:  [id number]   Chief Complaint: Hypertension and Hyperlipidemia  Patient is a 78 year old male who presents for a comprehensive physical exam. The patient reports the following problems: shoulders painful. Health maintenance has been reviewed up to date.  Hypertension This is a chronic problem. The current episode started more than 1 year ago. The problem is unchanged. The problem is controlled. Pertinent negatives include no anxiety, blurred vision, chest pain, headaches, malaise/fatigue, neck pain, orthopnea, palpitations, peripheral edema, PND, shortness of breath or sweats. There are no associated agents to hypertension. Risk factors for coronary artery disease include dyslipidemia, male gender and post-menopausal state. Past treatments include ACE inhibitors and diuretics. The current treatment provides moderate improvement. There are no compliance problems.  There is no history of angina, kidney disease, CAD/MI, CVA, heart failure, left ventricular hypertrophy, PVD or retinopathy. There is no history of chronic renal disease, a hypertension causing med or renovascular disease.  Hyperlipidemia The current episode started more than 1 year ago. The problem is controlled. Recent lipid tests were reviewed and are normal. He has no history of chronic renal disease, diabetes, hypothyroidism, liver disease, obesity or nephrotic syndrome. Factors aggravating his hyperlipidemia include thiazides. Pertinent negatives include no chest pain, focal sensory loss, focal weakness, leg pain, myalgias or shortness of breath. The current treatment provides moderate improvement of lipids. There are no compliance problems.  Risk factors for coronary artery disease include dyslipidemia, hypertension and male sex.    Review of Systems  Constitutional: Negative for chills, fever and malaise/fatigue.  HENT: Negative for drooling, ear discharge,  ear pain and sore throat.   Eyes: Negative for blurred vision.  Respiratory: Negative for cough, choking, chest tightness, shortness of breath and wheezing.   Cardiovascular: Negative for chest pain, palpitations, orthopnea, leg swelling and PND.  Gastrointestinal: Negative for abdominal pain, blood in stool, constipation, diarrhea and nausea.  Endocrine: Negative for polydipsia, polyphagia and polyuria.  Genitourinary: Negative for dysuria, frequency, hematuria and urgency.  Musculoskeletal: Positive for arthralgias. Negative for back pain, joint swelling, myalgias and neck pain.  Skin: Negative for color change, pallor and rash.  Allergic/Immunologic: Negative for environmental allergies.  Neurological: Negative for dizziness, focal weakness and headaches.  Hematological: Does not bruise/bleed easily.  Psychiatric/Behavioral: Negative for suicidal ideas. The patient is not nervous/anxious.     Patient Active Problem List   Diagnosis Date Noted  . Chronic obstructive pulmonary disease with acute exacerbation ([place]) 07/27/2017  . Asthma, chronic, severe persistent, uncomplicated 53/66/4403  . Seasonal allergic rhinitis due to pollen 08/18/2016  . Bilateral carotid artery stenosis 04/03/2016  . Bilateral hydrocele 11/22/2014  . BPH with obstruction/lower urinary tract symptoms 10/05/2014  . Epididymal cyst 10/05/2014  . Familial multiple lipoprotein-type hyperlipidemia 07/14/2014  . Laboratory animal allergy 07/14/2014  . Acute arthropathy 07/14/2014  . Routine general medical examination at a health care facility 07/14/2014  . Asthma with exacerbation 07/14/2014  . Essential (primary) hypertension 07/14/2014  . Screening for depression 07/14/2014  . Noncompliance w/medication treatment due to intermit use of medication 11/23/2013  . Asthma, chronic 08/08/2013    Allergies  Allergen Reactions  . Codeine Itching    Past Surgical History:  Procedure Laterality Date  . ARTERY  REPAIR    . BACK SURGERY    . CHOLECYSTECTOMY    . COLONOSCOPY  2013   cleared  . HERNIA REPAIR  Social History   Tobacco Use  . Smoking status: Former Smoker    Packs/day: 1.00    Years: 20.00    Pack years: 20.00    Types: Cigarettes    Quit date: 08/09/1983    Years since quitting: 35.0  . Smokeless tobacco: Never Used  . Tobacco comment: quit smoking over 40 years ago  Substance Use Topics  . Alcohol use: Yes    Alcohol/week: 0.0 standard drinks    Comment: once monthly  . Drug use: No     Medication list has been reviewed and updated.  Current Meds  Medication Sig  . albuterol (PROVENTIL HFA;VENTOLIN HFA) 108 (90 Base) MCG/ACT inhaler Inhale 2 puffs into the lungs every 6 (six) hours as needed for wheezing or shortness of breath.  . ALLERGY RELIEF 180 MG tablet TAKE ONE (1) TABLET BY MOUTH ONCE DAILY  . aspirin 81 MG tablet Take 81 mg by mouth daily.  Marland Kitchen atorvastatin (LIPITOR) 40 MG tablet TAKE (1) TABLET BY MOUTH EVERY DAY  . finasteride (PROSCAR) 5 MG tablet TAKE (1) TABLET BY MOUTH EVERY DAY  . Fluticasone-Umeclidin-Vilant (TRELEGY ELLIPTA) 100-62.5-25 MCG/INH AEPB Inhale 1 puff into the lungs daily.  Marland Kitchen ibuprofen (ADVIL) 800 MG tablet TAKE (1) TABLET BY MOUTH THREE TIMES A DAY  . ipratropium-albuterol (DUONEB) 0.5-2.5 (3) MG/3ML SOLN TAKE 3MLS BY NEBULIZATION EVERY 6 HOURS AS NEEDED  . lisinopril-hydrochlorothiazide (PRINZIDE,ZESTORETIC) 10-12.5 MG tablet 1 a day  . montelukast (SINGULAIR) 10 MG tablet TAKE ONE (1) TABLET BY MOUTH ONCE DAILY   Current Facility-Administered Medications for the 08/23/18 encounter (Office Visit) with Juline Patch, MD  Medication  . ipratropium-albuterol (DUONEB) 0.5-2.5 (3) MG/3ML nebulizer solution 3 mL  . ipratropium-albuterol (DUONEB) 0.5-2.5 (3) MG/3ML nebulizer solution 3 mL    PHQ 2/9 Scores 02/19/2018 02/08/2018 03/23/2017 08/18/2016  PHQ - 2 Score 0 0 0 0  PHQ- 9 Score 0 - - -    BP Readings from Last 3 Encounters:   08/23/18 138/60  02/19/18 124/70  02/11/18 (!) 136/52    Physical Exam Vitals signs and nursing note reviewed.  Constitutional:      General: He is awake.     Appearance: Normal appearance. He is well-developed and well-groomed.  HENT:     Head: Normocephalic.     Jaw: There is normal jaw occlusion.     Right Ear: Hearing, tympanic membrane, ear canal and external ear normal.     Left Ear: Hearing, tympanic membrane, ear canal and external ear normal.     Nose: Nose normal.     Right Turbinates: Not swollen.     Left Turbinates: Not swollen.     Mouth/Throat:     Lips: Pink.     Mouth: Mucous membranes are moist.     Dentition: Normal dentition.     Tongue: No lesions. Tongue does not deviate from midline.  Eyes:     General: Lids are normal. Vision grossly intact. Gaze aligned appropriately. No visual field deficit or scleral icterus.       Right eye: No discharge.        Left eye: No discharge.     Extraocular Movements: Extraocular movements intact.     Conjunctiva/sclera: Conjunctivae normal.     Pupils: Pupils are equal, round, and reactive to light.  Neck:     Musculoskeletal: Full passive range of motion without pain, normal range of motion and neck supple. Normal range of motion.     Thyroid: No thyroid  mass, thyromegaly or thyroid tenderness.     Vascular: Normal carotid pulses. No carotid bruit, hepatojugular reflux or JVD.     Trachea: Trachea normal. No tracheal deviation.  Cardiovascular:     Rate and Rhythm: Normal rate and regular rhythm.     Chest Wall: PMI is not displaced. No thrill.     Pulses: Normal pulses. No decreased pulses.          Carotid pulses are 2+ on the right side and 2+ on the left side.      Radial pulses are 2+ on the right side and 2+ on the left side.       Femoral pulses are 2+ on the right side and 2+ on the left side.      Popliteal pulses are 2+ on the right side and 2+ on the left side.       Dorsalis pedis pulses are 2+ on the  right side and 2+ on the left side.       Posterior tibial pulses are 2+ on the right side and 2+ on the left side.     Heart sounds: Normal heart sounds, S1 normal and S2 normal. No murmur. No systolic murmur. No diastolic murmur. No friction rub. No gallop. No S3 or S4 sounds.   Pulmonary:     Effort: Pulmonary effort is normal. No respiratory distress.     Breath sounds: Normal breath sounds. No decreased breath sounds, wheezing, rhonchi or rales.  Chest:     Chest wall: No mass or tenderness.     Breasts:        Right: Normal. No swelling.        Left: Normal. No swelling.  Abdominal:     General: Bowel sounds are normal.     Palpations: Abdomen is soft. There is no hepatomegaly, splenomegaly or mass.     Tenderness: There is no abdominal tenderness. There is no guarding or rebound.     Hernia: No hernia is present. There is no hernia in the left inguinal area or right inguinal area.  Genitourinary:    Penis: Normal.      Scrotum/Testes: Normal.        Right: Mass not present.        Left: Mass not present.     Epididymis:     Right: Normal.     Left: Normal.     Comments: Bilateral atrophy Musculoskeletal: Normal range of motion.        General: No tenderness.     Cervical back: Normal.     Thoracic back: Normal.     Lumbar back: Normal.     Right lower leg: No edema.     Left lower leg: No edema.  Lymphadenopathy:     Head:     Right side of head: No submandibular or tonsillar adenopathy.     Left side of head: No submandibular or tonsillar adenopathy.     Cervical: No cervical adenopathy.     Right cervical: No superficial, deep or posterior cervical adenopathy.    Left cervical: No superficial, deep or posterior cervical adenopathy.     Upper Body:     Right upper body: No supraclavicular or axillary adenopathy.     Left upper body: No supraclavicular or axillary adenopathy.     Lower Body: No right inguinal adenopathy. No left inguinal adenopathy.  Skin:     General: Skin is warm.     Capillary Refill: Capillary refill takes  less than 2 seconds.     Coloration: Skin is not jaundiced.     Findings: No rash.  Neurological:     Mental Status: He is alert and oriented to person, place, and time.     Cranial Nerves: Cranial nerves are intact. No cranial nerve deficit or facial asymmetry.     Sensory: Sensation is intact.     Motor: Motor function is intact.     Deep Tendon Reflexes: Reflexes are normal and symmetric.     Reflex Scores:      Tricep reflexes are 2+ on the right side and 2+ on the left side.      Bicep reflexes are 2+ on the right side and 2+ on the left side.      Brachioradialis reflexes are 2+ on the right side and 2+ on the left side.      Patellar reflexes are 2+ on the right side and 2+ on the left side.      Achilles reflexes are 2+ on the right side and 2+ on the left side. Psychiatric:        Attention and Perception: Attention and perception normal.        Mood and Affect: Mood and affect normal.        Speech: Speech normal.        Cognition and Memory: Cognition normal.     Wt Readings from Last 3 Encounters:  08/23/18 187 lb (84.8 kg)  02/19/18 182 lb (82.6 kg)  02/11/18 184 lb (83.5 kg)    BP 138/60   Pulse 64   Ht 5\' 10"  (1.778 m)   Wt 187 lb (84.8 kg)   BMI 26.83 kg/m   Assessment and Plan: 1. Familial multiple lipoprotein-type hyperlipidemia Chronic.  Controlled.  Continue atorvastatin 40 mg once a day will check lipid panel. - atorvastatin (LIPITOR) 40 MG tablet; TAKE (1) TABLET BY MOUTH EVERY DAY  Dispense: 90 tablet; Refill: 1 - Lipid Panel With LDL/HDL Ratio  2. Essential (primary) hypertension Chronic.  Controlled.  Continue lisinopril hydrochlorothiazide 10-12 0.5 once a day..  Will check renal function panel. - lisinopril-hydrochlorothiazide (ZESTORETIC) 10-12.5 MG tablet; 1 a day  Dispense: 90 tablet; Refill: 1 - Renal Function Panel  3. Asthma, chronic, severe persistent, uncomplicated  Chronic.  Controlled.  Continue Singulair.  Patient is followed by pulmonary with use of Trelegy and rescue albuterol inhaler. - montelukast (SINGULAIR) 10 MG tablet; TAKE ONE (1) TABLET BY MOUTH ONCE DAILY  Dispense: 90 tablet; Refill: 1  4. Seasonal allergic rhinitis due to pollen Patient is on Singulair and will continue at 10 mg once a day. - montelukast (SINGULAIR) 10 MG tablet; TAKE ONE (1) TABLET BY MOUTH ONCE DAILY  Dispense: 90 tablet; Refill: 1  5. Taking medication for chronic disease Patient has a history of statin use and will check hepatic function panel - Hepatic function panel  6. Encounter for annual physical exam No subjective objective concerns noted during the history/physical exam.  Previous encounters, labs, and imaging was reviewed.  Pulmonary notes were noted as well as urology.Kord ANTWIAN SANTAANA is a 78 y.o. male who presents today for his Complete Annual Exam. He feels well. He reports exercising mows yards constantly as determined by spouse. He reports he is sleeping well. Immunizations are reviewed and recommendations provided.   Age appropriate screening tests are discussed. Counseling given for risk factor reduction interventions.Health risks of being over weight were discussed and patient was counseled on weight loss options  and exercise.See diet

## 2018-08-23 NOTE — Addendum Note (Signed)
Addended by: Fredderick Severance on: 08/23/2018 11:46 AM   Modules accepted: Orders

## 2018-08-24 LAB — HEPATIC FUNCTION PANEL
ALT: 23 IU/L (ref 0–44)
AST: 52 IU/L — ABNORMAL HIGH (ref 0–40)
Alkaline Phosphatase: 64 IU/L (ref 39–117)
Bilirubin Total: 0.5 mg/dL (ref 0.0–1.2)
Bilirubin, Direct: 0.14 mg/dL (ref 0.00–0.40)
Total Protein: 7.1 g/dL (ref 6.0–8.5)

## 2018-08-24 LAB — LIPID PANEL WITH LDL/HDL RATIO
Cholesterol, Total: 163 mg/dL (ref 100–199)
HDL: 60 mg/dL (ref >39)
LDL Calculated: 90 mg/dL (ref 0–99)
LDl/HDL Ratio: 1.5 ratio (ref 0.0–3.6)
Triglycerides: 67 mg/dL (ref 0–149)
VLDL Cholesterol Cal: 13 mg/dL (ref 5–40)

## 2018-08-24 LAB — RENAL FUNCTION PANEL
Albumin: 4.5 g/dL (ref 3.7–4.7)
BUN/Creatinine Ratio: 17 (ref 10–24)
BUN: 17 mg/dL (ref 8–27)
CO2: 23 mmol/L (ref 20–29)
Calcium: 9.6 mg/dL (ref 8.6–10.2)
Chloride: 102 mmol/L (ref 96–106)
Creatinine, Ser: 1.03 mg/dL (ref 0.76–1.27)
GFR calc Af Amer: 81 mL/min/{1.73_m2} (ref >59)
GFR calc non Af Amer: 70 mL/min/{1.73_m2} (ref >59)
Glucose: 90 mg/dL (ref 65–99)
Phosphorus: 3.4 mg/dL (ref 2.8–4.1)
Potassium: 4.4 mmol/L (ref 3.5–5.2)
Sodium: 139 mmol/L (ref 134–144)

## 2018-08-30 ENCOUNTER — Other Ambulatory Visit: Payer: Self-pay

## 2018-08-30 ENCOUNTER — Ambulatory Visit: Payer: Medicare PPO | Admitting: Internal Medicine

## 2018-08-30 ENCOUNTER — Encounter: Payer: Self-pay | Admitting: Internal Medicine

## 2018-08-30 VITALS — BP 122/62 | HR 67 | Resp 16 | Ht 70.0 in | Wt 188.0 lb

## 2018-08-30 DIAGNOSIS — J449 Chronic obstructive pulmonary disease, unspecified: Secondary | ICD-10-CM

## 2018-08-30 DIAGNOSIS — G4733 Obstructive sleep apnea (adult) (pediatric): Secondary | ICD-10-CM

## 2018-08-30 DIAGNOSIS — I10 Essential (primary) hypertension: Secondary | ICD-10-CM

## 2018-08-30 DIAGNOSIS — R0602 Shortness of breath: Secondary | ICD-10-CM

## 2018-08-30 DIAGNOSIS — Z9989 Dependence on other enabling machines and devices: Secondary | ICD-10-CM

## 2018-08-30 MED ORDER — TRELEGY ELLIPTA 100-62.5-25 MCG/INH IN AEPB: 1.0000 | INHALATION_SPRAY | Freq: Every day | RESPIRATORY_TRACT | 5 refills | Status: DC

## 2018-08-30 NOTE — Patient Instructions (Signed)

## 2018-08-30 NOTE — Progress Notes (Signed)
[facility] [address], [place] [address]  Pulmonary Sleep Medicine   Office Visit Note  Patient Name: Marcus Delacruz DOB: [birth year] MRN [id number]  Date of Service: 08/30/2018  Complaints/HPI: He has been doing well. He staets he is staying at home. He has no cough no congestion noted. He does note tre;egy makes him lose his voice. Patient states that he has no wheeze noted. He has been on advair but he feels the trelegy works better and does not want to switch. No exposure to covid or anyone infectious. We did talk about prevention  ROS  General: (-) fever, (-) chills, (-) night sweats, (-) weakness Skin: (-) rashes, (-) itching,. Eyes: (-) visual changes, (-) redness, (-) itching. Nose and Sinuses: (-) nasal stuffiness or itchiness, (-) postnasal drip, (-) nosebleeds, (-) sinus trouble. Mouth and Throat: (-) sore throat, (-) hoarseness. Neck: (-) swollen glands, (-) enlarged thyroid, (-) neck pain. Respiratory: - cough, (-) bloody sputum, + shortness of breath, - wheezing. Cardiovascular: - ankle swelling, (-) chest pain. Lymphatic: (-) lymph node enlargement. Neurologic: (-) numbness, (-) tingling. Psychiatric: (-) anxiety, (-) depression   Current Medication: Outpatient Encounter Medications as of 08/30/2018  Medication Sig  . albuterol (PROVENTIL HFA;VENTOLIN HFA) 108 (90 Base) MCG/ACT inhaler Inhale 2 puffs into the lungs every 6 (six) hours as needed for wheezing or shortness of breath.  . ALLERGY RELIEF 180 MG tablet TAKE ONE (1) TABLET BY MOUTH ONCE DAILY  . aspirin 81 MG tablet Take 81 mg by mouth daily.  Marland Kitchen atorvastatin (LIPITOR) 40 MG tablet TAKE (1) TABLET BY MOUTH EVERY DAY  . finasteride (PROSCAR) 5 MG tablet TAKE (1) TABLET BY MOUTH EVERY DAY  . Fluticasone-Umeclidin-Vilant (TRELEGY ELLIPTA) 100-62.5-25 MCG/INH AEPB Inhale 1 puff into the lungs daily.  Marland Kitchen ibuprofen (ADVIL) 800 MG tablet TAKE (1) TABLET BY MOUTH THREE TIMES A DAY  .  ipratropium-albuterol (DUONEB) 0.5-2.5 (3) MG/3ML SOLN TAKE 3MLS BY NEBULIZATION EVERY 6 HOURS AS NEEDED  . lisinopril-hydrochlorothiazide (ZESTORETIC) 10-12.5 MG tablet 1 a day  . montelukast (SINGULAIR) 10 MG tablet TAKE ONE (1) TABLET BY MOUTH ONCE DAILY  . NON FORMULARY cpap device   Facility-Administered Encounter Medications as of 08/30/2018  Medication  . ipratropium-albuterol (DUONEB) 0.5-2.5 (3) MG/3ML nebulizer solution 3 mL  . ipratropium-albuterol (DUONEB) 0.5-2.5 (3) MG/3ML nebulizer solution 3 mL    Surgical History: Past Surgical History:  Procedure Laterality Date  . ARTERY REPAIR    . BACK SURGERY    . CHOLECYSTECTOMY    . COLONOSCOPY  2013   cleared  . HERNIA REPAIR      Medical History: Past Medical History:  Diagnosis Date  . Allergic rhinitis due to pollen   . Asthma   . BPH (benign prostatic hyperplasia)   . Cardiomegaly   . Chronic obstructive pulmonary disease (COPD) ([place])   . Hypercholesteremia   . Hypersomnia, unspecified   . Hypertension   . Obstructive sleep apnea (adult) (pediatric)   . Shortness of breath   . Snoring   . Solitary pulmonary nodule     Family History: Family History  Problem Relation Age of Onset  . COPD Mother   . Asthma Son   . Cancer Father        lung  . Prostate cancer Neg Hx   . Kidney disease Neg Hx     Social History: Social History   Socioeconomic History  . Marital status: Married    Spouse name: Not on  file  . Number of children: 3  . Years of education: Not on file  . Highest education level: Associate degree: academic program  Occupational History  . Occupation: retired  Scientific laboratory technician  . Financial resource strain: Not hard at all  . Food insecurity    Worry: Never true    Inability: Never true  . Transportation needs    Medical: No    Non-medical: No  Tobacco Use  . Smoking status: Former Smoker    Packs/day: 1.00    Years: 20.00    Pack years: 20.00    Types: Cigarettes    Quit date:  08/09/1983    Years since quitting: 35.0  . Smokeless tobacco: Never Used  . Tobacco comment: quit smoking over 40 years ago  Substance and Sexual Activity  . Alcohol use: Yes    Alcohol/week: 0.0 standard drinks    Comment: once monthly  . Drug use: No  . Sexual activity: Not on file  Lifestyle  . Physical activity    Days per week: 7 days    Minutes per session: 30 min  . Stress: Patient refused  Relationships  . Social connections    Talks on phone: More than three times a week    Gets together: More than three times a week    Attends religious service: More than 4 times per year    Active member of club or organization: No    Attends meetings of clubs or organizations: Never    Relationship status: Married  . Intimate partner violence    Fear of current or ex partner: No    Emotionally abused: No    Physically abused: No    Forced sexual activity: No  Other Topics Concern  . Not on file  Social History Narrative  . Not on file    Vital Signs: Blood pressure 122/62, pulse 67, resp. rate 16, height 5\' 10"  (1.778 m), weight 188 lb (85.3 kg), SpO2 95 %.  Examination: General Appearance: The patient is well-developed, well-nourished, and in no distress. Skin: Gross inspection of skin unremarkable. Head: normocephalic, no gross deformities. Eyes: no gross deformities noted. ENT: ears appear grossly normal no exudates. Neck: Supple. No thyromegaly. No LAD. Respiratory: no rhonchi noted at this time. Cardiovascular: Normal S1 and S2 without murmur or rub. Extremities: No cyanosis. pulses are equal. Neurologic: Alert and oriented. No involuntary movements.  LABS: Recent Results (from the past 2160 hour(s))  Renal Function Panel     Status: None   Collection Time: 08/23/18 10:06 AM  Result Value Ref Range   Glucose 90 65 - 99 mg/dL   BUN 17 8 - 27 mg/dL   Creatinine, Ser 1.03 0.76 - 1.27 mg/dL   GFR calc non Af Amer 70 >59 mL/min/1.73   GFR calc Af Amer 81 >59  mL/min/1.73   BUN/Creatinine Ratio 17 10 - 24   Sodium 139 134 - 144 mmol/L   Potassium 4.4 3.5 - 5.2 mmol/L   Chloride 102 96 - 106 mmol/L   CO2 23 20 - 29 mmol/L   Calcium 9.6 8.6 - 10.2 mg/dL   Phosphorus 3.4 2.8 - 4.1 mg/dL   Albumin 4.5 3.7 - 4.7 g/dL  Lipid Panel With LDL/HDL Ratio     Status: None   Collection Time: 08/23/18 10:06 AM  Result Value Ref Range   Cholesterol, Total 163 100 - 199 mg/dL   Triglycerides 67 0 - 149 mg/dL   HDL 60 >39 mg/dL  VLDL Cholesterol Cal 13 5 - 40 mg/dL   LDL Calculated 90 0 - 99 mg/dL   LDl/HDL Ratio 1.5 0.0 - 3.6 ratio    Comment:                                     LDL/HDL Ratio                                             Men  Women                               1/2 Avg.Risk  1.0    1.5                                   Avg.Risk  3.6    3.2                                2X Avg.Risk  6.2    5.0                                3X Avg.Risk  8.0    6.1   Hepatic function panel     Status: Abnormal   Collection Time: 08/23/18 10:06 AM  Result Value Ref Range   Total Protein 7.1 6.0 - 8.5 g/dL   Bilirubin Total 0.5 0.0 - 1.2 mg/dL   Bilirubin, Direct 0.14 0.00 - 0.40 mg/dL   Alkaline Phosphatase 64 39 - 117 IU/L   AST 52 (H) 0 - 40 IU/L   ALT 23 0 - 44 IU/L  POCT Occult Blood Stool     Status: None   Collection Time: 08/23/18 11:45 AM  Result Value Ref Range   Fecal Occult Blood, POC Negative Negative   Card #1 Date     Card #2 Fecal Occult Blod, POC     Card #2 Date     Card #3 Fecal Occult Blood, POC     Card #3 Date      Radiology: Dg Shoulder Right  Result Date: 01/08/2018 CLINICAL DATA:  persistent right shoulder pain EXAM: RIGHT SHOULDER - 2+ VIEW COMPARISON:  None. FINDINGS: No acute fracture. No dislocation.  Unremarkable soft tissues. IMPRESSION: No acute bony pathology. Electronically Signed   By: Marybelle Killings M.D.   On: 01/08/2018 07:16    No results found.  No results found.    Assessment and Plan: Patient  Active Problem List   Diagnosis Date Noted  . Chronic obstructive pulmonary disease with acute exacerbation ([place]) 07/27/2017  . Asthma, chronic, severe persistent, uncomplicated 67/67/2094  . Seasonal allergic rhinitis due to pollen 08/18/2016  . Bilateral carotid artery stenosis 04/03/2016  . Bilateral hydrocele 11/22/2014  . BPH with obstruction/lower urinary tract symptoms 10/05/2014  . Epididymal cyst 10/05/2014  . Familial multiple lipoprotein-type hyperlipidemia 07/14/2014  . Laboratory animal allergy 07/14/2014  . Acute arthropathy 07/14/2014  . Routine general medical examination at a health care facility 07/14/2014  . Asthma with exacerbation 07/14/2014  . Essential (primary) hypertension 07/14/2014  .  Screening for depression 07/14/2014  . Noncompliance w/medication treatment due to intermit use of medication 11/23/2013  . Asthma, chronic 08/08/2013    1. COPD moderate based on his PFT and spiro. Compared the last one and has been basically unchanged 2. OSA he is using his CPAP device. Good compliance noted 3. SOB due to COPD at baseline good response to trelegy 4. HTN controlled  General Counseling: I have discussed the findings of the evaluation and examination with Luman.  I have also discussed any further diagnostic evaluation thatmay be needed or ordered today. Marky verbalizes understanding of the findings of todays visit. We also reviewed his medications today and discussed drug interactions and side effects including but not limited excessive drowsiness and altered mental states. We also discussed that there is always a risk not just to him but also people around him. he has been encouraged to call the office with any questions or concerns that should arise related to todays visit.    Time spent: 24min  I have personally obtained a history, examined the patient, evaluated laboratory and imaging results, formulated the assessment and plan and placed orders.     Allyne Gee, MD [facility] Pulmonary and Critical Care Sleep medicine

## 2018-09-01 DIAGNOSIS — L570 Actinic keratosis: Secondary | ICD-10-CM | POA: Diagnosis not present

## 2018-09-01 DIAGNOSIS — Z85828 Personal history of other malignant neoplasm of skin: Secondary | ICD-10-CM | POA: Diagnosis not present

## 2018-09-01 DIAGNOSIS — Z872 Personal history of diseases of the skin and subcutaneous tissue: Secondary | ICD-10-CM | POA: Diagnosis not present

## 2018-09-01 DIAGNOSIS — L578 Other skin changes due to chronic exposure to nonionizing radiation: Secondary | ICD-10-CM | POA: Diagnosis not present

## 2018-11-02 ENCOUNTER — Other Ambulatory Visit: Payer: Self-pay | Admitting: Urology

## 2018-11-02 ENCOUNTER — Other Ambulatory Visit: Payer: Self-pay | Admitting: Family Medicine

## 2018-11-02 DIAGNOSIS — M5136 Other intervertebral disc degeneration, lumbar region: Secondary | ICD-10-CM

## 2018-11-02 NOTE — Telephone Encounter (Signed)
Looks like pt has been getting Finasteride from PCP, who will no longer fill it. Pt last seen by you in 2017. He does have an appt scheduled with you in November. Ok to give pt enough to last until appt? Please advise. Thanks

## 2018-11-03 ENCOUNTER — Other Ambulatory Visit: Payer: Self-pay

## 2018-11-03 ENCOUNTER — Ambulatory Visit: Payer: Medicare PPO

## 2018-11-03 DIAGNOSIS — G4733 Obstructive sleep apnea (adult) (pediatric): Secondary | ICD-10-CM | POA: Diagnosis not present

## 2018-11-03 NOTE — Progress Notes (Signed)
95 percentile pressure 11   95th percentile leak 4.6   apnea index 0.4 /hr  apnea-hypopnea index  1.3 /hr   total days used  >4 hr 88 days  total days used <4 hr 2 days  Total compliance 98 percent  He is doing great on cpap no problems or question

## 2018-11-09 DIAGNOSIS — H2513 Age-related nuclear cataract, bilateral: Secondary | ICD-10-CM | POA: Diagnosis not present

## 2018-11-29 DIAGNOSIS — G4733 Obstructive sleep apnea (adult) (pediatric): Secondary | ICD-10-CM | POA: Diagnosis not present

## 2018-12-27 ENCOUNTER — Other Ambulatory Visit: Payer: Self-pay | Admitting: Adult Health

## 2018-12-27 DIAGNOSIS — J449 Chronic obstructive pulmonary disease, unspecified: Secondary | ICD-10-CM

## 2019-02-07 ENCOUNTER — Ambulatory Visit: Payer: Medicare PPO | Admitting: Urology

## 2019-02-14 ENCOUNTER — Other Ambulatory Visit: Payer: Self-pay | Admitting: Family Medicine

## 2019-02-14 ENCOUNTER — Other Ambulatory Visit: Payer: Self-pay | Admitting: Urology

## 2019-02-14 DIAGNOSIS — M5136 Other intervertebral disc degeneration, lumbar region: Secondary | ICD-10-CM

## 2019-02-14 DIAGNOSIS — I10 Essential (primary) hypertension: Secondary | ICD-10-CM

## 2019-02-16 ENCOUNTER — Ambulatory Visit (INDEPENDENT_AMBULATORY_CARE_PROVIDER_SITE_OTHER): Payer: Medicare PPO

## 2019-02-16 DIAGNOSIS — Z0000 Encounter for general adult medical examination without abnormal findings: Secondary | ICD-10-CM | POA: Diagnosis not present

## 2019-02-16 NOTE — Progress Notes (Addendum)
Subjective:   Marcus Delacruz is a 78 y.o. male who presents for Medicare Annual/Subsequent preventive examination.  Virtual Visit via Telephone Note  I connected with Marcus Delacruz on 02/16/19 at  2:00 PM EST by telephone and verified that I am speaking with the correct person using two identifiers.  Medicare Annual Wellness visit completed telephonically due to Covid-19 pandemic.   Location: Patient: home Provider: office   I discussed the limitations, risks, security and privacy concerns of performing an evaluation and management service by telephone and the availability of in person appointments. The patient expressed understanding and agreed to proceed.  Some vital signs may be absent or patient reported.   Clemetine Marker, LPN    Review of Systems:   Cardiac Risk Factors include: advanced age (>58men, >79 women);male gender;hypertension;dyslipidemia     Objective:    Vitals: There were no vitals taken for this visit.  There is no height or weight on file to calculate BMI.  Advanced Directives 02/16/2019 02/08/2018 04/03/2016 01/17/2015  Does Patient Have a Medical Advance Directive? Yes Yes Yes Yes  Type of Paramedic of [place];Living will [place];Living will [place];Living will Living will;Healthcare Power of [place] in Chart? No - copy requested No - copy requested - No - copy requested    Tobacco Social History   Tobacco Use  Smoking Status Former Smoker  . Packs/day: 1.00  . Years: 20.00  . Pack years: 20.00  . Types: Cigarettes  . Quit date: 08/09/1983  . Years since quitting: 35.5  Smokeless Tobacco Never Used  Tobacco Comment   quit smoking over 40 years ago     Counseling given: Not Answered Comment: quit smoking over 40 years ago   Clinical Intake:  Pre-visit preparation completed: Yes  Pain : 0-10 Pain Score: 1  Pain Type:  Chronic pain Pain Location: Shoulder Pain Orientation: Right Pain Descriptors / Indicators: Aching, Sore(bursitis) Pain Onset: More than a month ago Pain Frequency: Constant     Nutritional Risks: None Diabetes: No  How often do you need to have someone help you when you read instructions, pamphlets, or other written materials from your doctor or pharmacy?: 1 - Never  Interpreter Needed?: No  Information entered by :: Clemetine Marker LPN  Past Medical History:  Diagnosis Date  . Allergic rhinitis due to pollen   . Asthma   . BPH (benign prostatic hyperplasia)   . Cardiomegaly   . Chronic obstructive pulmonary disease (COPD) ([place])   . Hypercholesteremia   . Hypersomnia, unspecified   . Hypertension   . Obstructive sleep apnea (adult) (pediatric)   . Shortness of breath   . Snoring   . Solitary pulmonary nodule    Past Surgical History:  Procedure Laterality Date  . ARTERY REPAIR    . BACK SURGERY    . CHOLECYSTECTOMY    . COLONOSCOPY  2013   cleared  . HERNIA REPAIR     Family History  Problem Relation Age of Onset  . COPD Mother   . Asthma Son   . Cancer Father        lung  . Prostate cancer Neg Hx   . Kidney disease Neg Hx    Social History   Socioeconomic History  . Marital status: Married    Spouse name: Not on file  . Number of children: 3  . Years of education: Not on file  .  Highest education level: Associate degree: academic program  Occupational History  . Occupation: retired  Scientific laboratory technician  . Financial resource strain: Not hard at all  . Food insecurity    Worry: Never true    Inability: Never true  . Transportation needs    Medical: No    Non-medical: No  Tobacco Use  . Smoking status: Former Smoker    Packs/day: 1.00    Years: 20.00    Pack years: 20.00    Types: Cigarettes    Quit date: 08/09/1983    Years since quitting: 35.5  . Smokeless tobacco: Never Used  . Tobacco comment: quit smoking over 40 years ago  Substance and Sexual  Activity  . Alcohol use: Yes    Alcohol/week: 0.0 standard drinks    Comment: once monthly  . Drug use: No  . Sexual activity: Not on file  Lifestyle  . Physical activity    Days per week: 7 days    Minutes per session: 30 min  . Stress: Not at all  Relationships  . Social connections    Talks on phone: More than three times a week    Gets together: More than three times a week    Attends religious service: More than 4 times per year    Active member of club or organization: No    Attends meetings of clubs or organizations: Never    Relationship status: Married  Other Topics Concern  . Not on file  Social History Narrative  . Not on file    Outpatient Encounter Medications as of 02/16/2019  Medication Sig  . albuterol (PROVENTIL HFA;VENTOLIN HFA) 108 (90 Base) MCG/ACT inhaler Inhale 2 puffs into the lungs every 6 (six) hours as needed for wheezing or shortness of breath.  . ALLERGY RELIEF 180 MG tablet TAKE ONE (1) TABLET BY MOUTH ONCE DAILY  . aspirin 81 MG tablet Take 81 mg by mouth daily.  Marland Kitchen atorvastatin (LIPITOR) 40 MG tablet TAKE (1) TABLET BY MOUTH EVERY DAY  . finasteride (PROSCAR) 5 MG tablet TAKE (1) TABLET BY MOUTH EVERY DAY  . ibuprofen (ADVIL) 800 MG tablet TAKE (1) TABLET BY MOUTH THREE TIMES A DAY  . lisinopril-hydrochlorothiazide (ZESTORETIC) 10-12.5 MG tablet TAKE (1) TABLET BY MOUTH EVERY DAY  . montelukast (SINGULAIR) 10 MG tablet TAKE ONE (1) TABLET BY MOUTH ONCE DAILY  . NON FORMULARY cpap device  . TRELEGY ELLIPTA 100-62.5-25 MCG/INH AEPB INHALE 1 PUFF INTO THE LUNGS EVERY DAY  . ipratropium-albuterol (DUONEB) 0.5-2.5 (3) MG/3ML SOLN TAKE 3MLS BY NEBULIZATION EVERY 6 HOURS AS NEEDED (Patient not taking: Reported on 02/16/2019)  . [DISCONTINUED] meloxicam (MOBIC) 15 MG tablet Take 1 tablet by mouth daily.   Facility-Administered Encounter Medications as of 02/16/2019  Medication  . ipratropium-albuterol (DUONEB) 0.5-2.5 (3) MG/3ML nebulizer solution 3 mL  .  ipratropium-albuterol (DUONEB) 0.5-2.5 (3) MG/3ML nebulizer solution 3 mL    Activities of Daily Living In your present state of health, do you have any difficulty performing the following activities: 02/16/2019  Hearing? N  Comment declines hearing aid  Vision? N  Difficulty concentrating or making decisions? N  Walking or climbing stairs? N  Dressing or bathing? N  Doing errands, shopping? N  Preparing Food and eating ? N  Using the Toilet? N  In the past six months, have you accidently leaked urine? N  Do you have problems with loss of bowel control? N  Managing your Medications? N  Managing your Finances? N  Housekeeping  or managing your Housekeeping? N  Some recent data might be hidden    Patient Care Team: Juline Patch, MD as PCP - General (Family Medicine)   Assessment:   This is a routine wellness examination for Marcus Delacruz.  Exercise Activities and Dietary recommendations Current Exercise Habits: Home exercise routine, Type of exercise: walking;Other - see comments(golf), Time (Minutes): 30, Frequency (Times/Week): 7, Weekly Exercise (Minutes/Week): 210, Intensity: Mild, Exercise limited by: None identified  Goals    . DIET - EAT MORE FRUITS AND VEGETABLES     Continue healthy eating habits with 3-4 servings of fruits and vegetables per day.       Fall Risk Fall Risk  02/16/2019 02/08/2018 03/23/2017 08/18/2016 11/05/2015  Falls in the past year? 0 1 No No No  Comment - pt slipped when getting out of his truck  - - [employer] Survey: data to providers prior to load  Number falls in past yr: 0 0 - - -  Injury with Fall? 0 1 - - -  Comment - slight tear in shoulder, completing physical therapy - - -  Follow up Falls prevention discussed Falls prevention discussed - - -   FALL RISK PREVENTION PERTAINING TO THE HOME:  Any stairs in or around the home? Yes  If so, do they handrails? Yes   Home free of loose throw rugs in walkways, pet beds, electrical cords, etc?  Yes  Adequate lighting in your home to reduce risk of falls? Yes   ASSISTIVE DEVICES UTILIZED TO PREVENT FALLS:  Life alert? No  Use of a cane, walker or w/c? No  Grab bars in the bathroom? No  Shower chair or bench in shower? No Elevated toilet seat or a handicapped toilet? Yes   DME ORDERS:  DME order needed?  No   TIMED UP AND GO:  Was the test performed? No . Telephonic visit.   Education: Fall risk prevention has been discussed.  Intervention(s) required? No   Depression Screen PHQ 2/9 Scores 02/16/2019 02/19/2018 02/08/2018 03/23/2017  PHQ - 2 Score 0 0 0 0  PHQ- 9 Score - 0 - -    Cognitive Function 6CIT deferred for 2020 AWV        Immunization History  Administered Date(s) Administered  . Influenza Split 12/08/2012  . Influenza, High Dose Seasonal PF 12/07/2017  . Influenza,inj,Quad PF,6+ Mos 01/06/2013, 11/23/2013  . Influenza,inj,quad, With Preservative 12/24/2016, 11/25/2018  . Influenza-Unspecified 01/07/2015, 01/21/2017  . Pneumococcal Conjugate-13 01/17/2015  . Pneumococcal Polysaccharide-23 06/27/2010  . Tdap 10/05/2011  . Zoster Recombinat (Shingrix) 02/06/2018, 04/19/2018, 11/09/2018    Qualifies for Shingles Vaccine? Shingrix series completed  Tdap: Up to date  Flu Vaccine: Up to date  Pneumococcal Vaccine: Up to date   Screening Tests Health Maintenance  Topic Date Due  . TETANUS/TDAP  10/04/2021  . INFLUENZA VACCINE  Completed  . PNA vac Low Risk Adult  Completed   Cancer Screenings:  Colorectal Screening: Completed 10/08/11. No longer required.   Lung Cancer Screening: (Low Dose CT Chest recommended if Age 56-80 years, 30 pack-year currently smoking OR have quit w/in 15years.) does not qualify.    Additional Screening:  Hepatitis C Screening: no longer required  Vision Screening: Recommended annual ophthalmology exams for early detection of glaucoma and other disorders of the eye. Is the patient up to date with their annual  eye exam?  Yes  Who is the provider or what is the name of the office in which the pt attends  annual eye exams? [place] Screening: Recommended annual dental exams for proper oral hygiene  Community Resource Referral:  CRR required this visit?  No       Plan:    I have personally reviewed and addressed the Medicare Annual Wellness questionnaire and have noted the following in the patient's chart:  A. Medical and social history B. Use of alcohol, tobacco or illicit drugs  C. Current medications and supplements D. Functional ability and status E.  Nutritional status F.  Physical activity G. Advance directives H. List of other physicians I.  Hospitalizations, surgeries, and ER visits in previous 12 months J.  [place] such as hearing and vision if needed, cognitive and depression L. Referrals and appointments   In addition, I have reviewed and discussed with patient certain preventive protocols, quality metrics, and best practice recommendations. A written personalized care plan for preventive services as well as general preventive health recommendations were provided to patient.   Signed,  Clemetine Marker, LPN Nurse Health Advisor   Nurse Notes: none

## 2019-02-16 NOTE — Patient Instructions (Signed)
[name] , Thank you for taking time to come for your Medicare Wellness Visit. I appreciate your ongoing commitment to your health goals. Please review the following plan we discussed and let me know if I can assist you in the future.   Screening recommendations/referrals: Colonoscopy: no longer required Recommended yearly ophthalmology/optometry visit for glaucoma screening and checkup Recommended yearly dental visit for hygiene and checkup  Vaccinations: Influenza vaccine: done 11/25/18 Pneumococcal vaccine: done 01/17/15 Tdap vaccine: done 10/05/11 Shingles vaccine: Shingrix series completed 11/09/18  Advanced directives: Please bring a copy of your health care power of attorney and living will to the office at your convenience.  Conditions/risks identified: Keep up the great work!  Next appointment: Please follow up in one year for your Medicare Annual Wellness visit.    Preventive Care 28 Years and Older, Male Preventive care refers to lifestyle choices and visits with your health care provider that can promote health and wellness. What does preventive care include?  A yearly physical exam. This is also called an annual well check.  Dental exams once or twice a year.  Routine eye exams. Ask your health care provider how often you should have your eyes checked.  Personal lifestyle choices, including:  Daily care of your teeth and gums.  Regular physical activity.  Eating a healthy diet.  Avoiding tobacco and drug use.  Limiting alcohol use.  Practicing safe sex.  Taking low doses of aspirin every day.  Taking vitamin and mineral supplements as recommended by your health care provider. What happens during an annual well check? The services and screenings done by your health care provider during your annual well check will depend on your age, overall health, lifestyle risk factors, and family history of disease. Counseling  Your health care provider may ask you  questions about your:  Alcohol use.  Tobacco use.  Drug use.  Emotional well-being.  Home and relationship well-being.  Sexual activity.  Eating habits.  History of falls.  Memory and ability to understand (cognition).  Work and work Statistician. Screening  You may have the following tests or measurements:  Height, weight, and BMI.  Blood pressure.  Lipid and cholesterol levels. These may be checked every 5 years, or more frequently if you are over 51 years old.  Skin check.  Lung cancer screening. You may have this screening every year starting at age 22 if you have a 30-pack-year history of smoking and currently smoke or have quit within the past 15 years.  Fecal occult blood test (FOBT) of the stool. You may have this test every year starting at age 96.  Flexible sigmoidoscopy or colonoscopy. You may have a sigmoidoscopy every 5 years or a colonoscopy every 10 years starting at age 49.  Prostate cancer screening. Recommendations will vary depending on your family history and other risks.  Hepatitis C blood test.  Hepatitis B blood test.  Sexually transmitted disease (STD) testing.  Diabetes screening. This is done by checking your blood sugar (glucose) after you have not eaten for a while (fasting). You may have this done every 1-3 years.  Abdominal aortic aneurysm (AAA) screening. You may need this if you are a current or former smoker.  Osteoporosis. You may be screened starting at age 97 if you are at high risk. Talk with your health care provider about your test results, treatment options, and if necessary, the need for more tests. Vaccines  Your health care provider may recommend certain vaccines, such as:  Influenza  vaccine. This is recommended every year.  Tetanus, diphtheria, and acellular pertussis (Tdap, Td) vaccine. You may need a Td booster every 10 years.  Zoster vaccine. You may need this after age 51.  Pneumococcal 13-valent conjugate  (PCV13) vaccine. One dose is recommended after age 66.  Pneumococcal polysaccharide (PPSV23) vaccine. One dose is recommended after age 59. Talk to your health care provider about which screenings and vaccines you need and how often you need them. This information is not intended to replace advice given to you by your health care provider. Make sure you discuss any questions you have with your health care provider. Document Released: 03/23/2015 Document Revised: 11/14/2015 Document Reviewed: 12/26/2014 Elsevier Interactive Patient Education  2017 [employer] Prevention in the Home Falls can cause injuries. They can happen to people of all ages. There are many things you can do to make your home safe and to help prevent falls. What can I do on the outside of my home?  Regularly fix the edges of walkways and driveways and fix any cracks.  Remove anything that might make you trip as you walk through a door, such as a raised step or threshold.  Trim any bushes or trees on the path to your home.  Use bright outdoor lighting.  Clear any walking paths of anything that might make someone trip, such as rocks or tools.  Regularly check to see if handrails are loose or broken. Make sure that both sides of any steps have handrails.  Any raised decks and porches should have guardrails on the edges.  Have any leaves, snow, or ice cleared regularly.  Use sand or salt on walking paths during winter.  Clean up any spills in your garage right away. This includes oil or grease spills. What can I do in the bathroom?  Use night lights.  Install grab bars by the toilet and in the tub and shower. Do not use towel bars as grab bars.  Use non-skid mats or decals in the tub or shower.  If you need to sit down in the shower, use a plastic, non-slip stool.  Keep the floor dry. Clean up any water that spills on the floor as soon as it happens.  Remove soap buildup in the tub or shower  regularly.  Attach bath mats securely with double-sided non-slip rug tape.  Do not have throw rugs and other things on the floor that can make you trip. What can I do in the bedroom?  Use night lights.  Make sure that you have a light by your bed that is easy to reach.  Do not use any sheets or blankets that are too big for your bed. They should not hang down onto the floor.  Have a firm chair that has side arms. You can use this for support while you get dressed.  Do not have throw rugs and other things on the floor that can make you trip. What can I do in the kitchen?  Clean up any spills right away.  Avoid walking on wet floors.  Keep items that you use a lot in easy-to-reach places.  If you need to reach something above you, use a strong step stool that has a grab bar.  Keep electrical cords out of the way.  Do not use floor polish or wax that makes floors slippery. If you must use wax, use non-skid floor wax.  Do not have throw rugs and other things on the floor that can  make you trip. What can I do with my stairs?  Do not leave any items on the stairs.  Make sure that there are handrails on both sides of the stairs and use them. Fix handrails that are broken or loose. Make sure that handrails are as long as the stairways.  Check any carpeting to make sure that it is firmly attached to the stairs. Fix any carpet that is loose or worn.  Avoid having throw rugs at the top or bottom of the stairs. If you do have throw rugs, attach them to the floor with carpet tape.  Make sure that you have a light switch at the top of the stairs and the bottom of the stairs. If you do not have them, ask someone to add them for you. What else can I do to help prevent falls?  Wear shoes that:  Do not have high heels.  Have rubber bottoms.  Are comfortable and fit you well.  Are closed at the toe. Do not wear sandals.  If you use a stepladder:  Make sure that it is fully  opened. Do not climb a closed stepladder.  Make sure that both sides of the stepladder are locked into place.  Ask someone to hold it for you, if possible.  Clearly mark and make sure that you can see:  Any grab bars or handrails.  First and last steps.  Where the edge of each step is.  Use tools that help you move around (mobility aids) if they are needed. These include:  Canes.  Walkers.  Scooters.  Crutches.  Turn on the lights when you go into a dark area. Replace any light bulbs as soon as they burn out.  Set up your furniture so you have a clear path. Avoid moving your furniture around.  If any of your floors are uneven, fix them.  If there are any pets around you, be aware of where they are.  Review your medicines with your doctor. Some medicines can make you feel dizzy. This can increase your chance of falling. Ask your doctor what other things that you can do to help prevent falls. This information is not intended to replace advice given to you by your health care provider. Make sure you discuss any questions you have with your health care provider. Document Released: 12/21/2008 Document Revised: 08/02/2015 Document Reviewed: 03/31/2014 Elsevier Interactive Patient Education  2017 [employer].

## 2019-02-20 NOTE — Progress Notes (Signed)
02/21/2019 10:17 AM   Marcus Delacruz Oct 19, 1940 [id number]  Referring provider: Juline Patch, MD [address] [place],  [place] [address]  Chief Complaint  Patient presents with  . Benign Prostatic Hypertrophy    HPI: Patient is a 78 year old male with BPH with LUTS who presents today for a yearly follow up.  BPH WITH LUTS His IPSS score today is 2, which is mild lower urinary tract symptomatology. He is delighted with his quality life due to his urinary symptoms.  His I PSS score was 1/1.  He has no major complaints today.  He denies any dysuria, hematuria or suprapubic pain.  He currently taking finasteride 5 mg daily.  He also denies any recent fevers, chills, nausea or vomiting.   He does not have a family history of PCa.  He would like to stop the finasteride at this time and see if his symptoms worsened.   IPSS    Row Name 02/21/19 1000         International Prostate Symptom Score   How often have you had the sensation of not emptying your bladder?  Not at All     How often have you had to urinate less than every two hours?  Not at All     How often have you found you stopped and started again several times when you urinated?  Not at All     How often have you found it difficult to postpone urination?  Not at All     How often have you had a weak urinary stream?  Not at All     How often have you had to strain to start urination?  Not at All     How many times did you typically get up at night to urinate?  2 Times     Total IPSS Score  2       Quality of Life due to urinary symptoms   If you were to spend the rest of your life with your urinary condition just the way it is now how would you feel about that?  Delighted        Score:  1-7 Mild 8-19 Moderate 20-35 Severe   PMH: Past Medical History:  Diagnosis Date  . Allergic rhinitis due to pollen   . Asthma   . BPH (benign prostatic hyperplasia)   . Cardiomegaly   . Chronic obstructive  pulmonary disease (COPD) ([place])   . Hypercholesteremia   . Hypersomnia, unspecified   . Hypertension   . Obstructive sleep apnea (adult) (pediatric)   . Shortness of breath   . Snoring   . Solitary pulmonary nodule     Surgical History: Past Surgical History:  Procedure Laterality Date  . ARTERY REPAIR    . BACK SURGERY    . CHOLECYSTECTOMY    . COLONOSCOPY  2013   cleared  . HERNIA REPAIR      Home Medications:  Allergies as of 02/21/2019      Reactions   Codeine Itching      Medication List       Accurate as of February 21, 2019 10:17 AM. If you have any questions, ask your nurse or doctor.        albuterol 108 (90 Base) MCG/ACT inhaler Commonly known as: VENTOLIN HFA Inhale 2 puffs into the lungs every 6 (six) hours as needed for wheezing or shortness of breath.   Allergy Relief 180 MG tablet Generic  drug: fexofenadine TAKE ONE (1) TABLET BY MOUTH ONCE DAILY   aspirin 81 MG tablet Take 81 mg by mouth daily.   atorvastatin 40 MG tablet Commonly known as: LIPITOR TAKE (1) TABLET BY MOUTH EVERY DAY   finasteride 5 MG tablet Commonly known as: PROSCAR TAKE (1) TABLET BY MOUTH EVERY DAY   ibuprofen 800 MG tablet Commonly known as: ADVIL TAKE (1) TABLET BY MOUTH THREE TIMES A DAY   ipratropium-albuterol 0.5-2.5 (3) MG/3ML Soln Commonly known as: DUONEB TAKE 3MLS BY NEBULIZATION EVERY 6 HOURS AS NEEDED   lisinopril-hydrochlorothiazide 10-12.5 MG tablet Commonly known as: ZESTORETIC TAKE (1) TABLET BY MOUTH EVERY DAY   montelukast 10 MG tablet Commonly known as: SINGULAIR TAKE ONE (1) TABLET BY MOUTH ONCE DAILY   NON FORMULARY cpap device   Trelegy Ellipta 100-62.5-25 MCG/INH Aepb Generic drug: Fluticasone-Umeclidin-Vilant INHALE 1 PUFF INTO THE LUNGS EVERY DAY       Allergies:  Allergies  Allergen Reactions  . Codeine Itching    Family History: Family History  Problem Relation Age of Onset  . COPD Mother   . Asthma Son   . Cancer  Father        lung  . Prostate cancer Neg Hx   . Kidney disease Neg Hx     Social History:  reports that he quit smoking about 35 years ago. His smoking use included cigarettes. He has a 20.00 pack-year smoking history. He has never used smokeless tobacco. He reports current alcohol use. He reports that he does not use drugs.  ROS: UROLOGY Frequent Urination?: No Hard to postpone urination?: No Burning/pain with urination?: No Get up at night to urinate?: No Leakage of urine?: No Urine stream starts and stops?: No Trouble starting stream?: No Do you have to strain to urinate?: No Blood in urine?: No Urinary tract infection?: No Sexually transmitted disease?: No Injury to kidneys or bladder?: No Painful intercourse?: No Weak stream?: No Erection problems?: No Penile pain?: No  Gastrointestinal Nausea?: No Vomiting?: No Indigestion/heartburn?: No Diarrhea?: No Constipation?: No  Constitutional Fever: No Night sweats?: No Weight loss?: No Fatigue?: No  Skin Skin rash/lesions?: No Itching?: No  Eyes Blurred vision?: No Double vision?: No  Ears/Nose/Throat Sore throat?: No Sinus problems?: No  Hematologic/Lymphatic Swollen glands?: No Easy bruising?: No  Cardiovascular Leg swelling?: No Chest pain?: No  Respiratory Cough?: No Shortness of breath?: No  Endocrine Excessive thirst?: No  Musculoskeletal Back pain?: Yes Joint pain?: Yes  Neurological Headaches?: No Dizziness?: No  Psychologic Depression?: No Anxiety?: No  Physical Exam: BP (!) 160/77   Pulse 68   Ht 5\' 10"  (1.778 m)   Wt 186 lb (84.4 kg)   BMI 26.69 kg/m   Constitutional:  Well nourished. Alert and oriented, No acute distress. HEENT: [place] AT, moist mucus membranes.  Trachea midline, no masses. Cardiovascular: No clubbing, cyanosis, or edema. Respiratory: Normal respiratory effort, no increased work of breathing. GI: Abdomen is soft, non tender, non distended, no abdominal  masses. Liver and spleen not palpable.  No hernias appreciated.  Stool sample for occult testing is not indicated.   GU: No CVA tenderness.  No bladder fullness or masses.  Patient with circumcised phallus.  Urethral meatus is patent.  No penile discharge. No penile lesions or rashes. Scrotum without lesions, cysts, rashes and/or edema.  Testicles are located scrotally bilaterally. No masses are appreciated in the testicles. Left and right epididymis are normal. Rectal: Patient with  normal sphincter tone. Anus and perineum  without scarring or rashes. No rectal masses are appreciated. Prostate is approximately 50 grams, no nodules are appreciated. Seminal vesicles could not be palpated.  Skin: No rashes, bruises or suspicious lesions. Lymph: No inguinal adenopathy. Neurologic: Grossly intact, no focal deficits, moving all 4 extremities. Psychiatric: Normal mood and affect.  Laboratory Data: Lab Results  Component Value Date   CREATININE 1.03 08/23/2018   Component     Latest Ref Rng & Units 11/20/2014 10/31/2015 08/20/2017  Prostate Specific Ag, Serum     0.0 - 4.0 ng/mL 0.5 0.5 0.8   I have reviewed the labs.  Assessment & Plan:    1. BPH with LUTS  - IPSS score is 1/1, it is stable  - Continue conservative management, avoiding bladder irritants and timed voiding's  - He will discontinue finasteride 5 mg daily  - RTC in 12 months for IPSS and exam   Return in about 1 year (around 02/21/2020) for IPSS, PVR and exam.  Zara Council, [facility]  [address] [place], [place] [address] [phone]

## 2019-02-21 ENCOUNTER — Other Ambulatory Visit: Payer: Self-pay

## 2019-02-21 ENCOUNTER — Encounter: Payer: Self-pay | Admitting: Urology

## 2019-02-21 ENCOUNTER — Ambulatory Visit: Payer: Medicare PPO | Admitting: Urology

## 2019-02-21 VITALS — BP 160/77 | HR 68 | Ht 70.0 in | Wt 186.0 lb

## 2019-02-21 DIAGNOSIS — N138 Other obstructive and reflux uropathy: Secondary | ICD-10-CM | POA: Diagnosis not present

## 2019-02-21 DIAGNOSIS — N401 Enlarged prostate with lower urinary tract symptoms: Secondary | ICD-10-CM

## 2019-02-21 MED ORDER — FINASTERIDE 5 MG PO TABS: ORAL_TABLET | ORAL | 3 refills | Status: DC

## 2019-02-23 ENCOUNTER — Other Ambulatory Visit: Payer: Self-pay

## 2019-02-23 ENCOUNTER — Encounter: Payer: Self-pay | Admitting: Family Medicine

## 2019-02-23 ENCOUNTER — Ambulatory Visit (INDEPENDENT_AMBULATORY_CARE_PROVIDER_SITE_OTHER): Payer: Medicare PPO | Admitting: Family Medicine

## 2019-02-23 VITALS — BP 138/64 | HR 64 | Ht 70.0 in | Wt 189.0 lb

## 2019-02-23 DIAGNOSIS — M5136 Other intervertebral disc degeneration, lumbar region: Secondary | ICD-10-CM | POA: Diagnosis not present

## 2019-02-23 DIAGNOSIS — I10 Essential (primary) hypertension: Secondary | ICD-10-CM | POA: Diagnosis not present

## 2019-02-23 DIAGNOSIS — M25512 Pain in left shoulder: Secondary | ICD-10-CM | POA: Diagnosis not present

## 2019-02-23 DIAGNOSIS — R7401 Elevation of levels of liver transaminase levels: Secondary | ICD-10-CM

## 2019-02-23 DIAGNOSIS — E7849 Other hyperlipidemia: Secondary | ICD-10-CM

## 2019-02-23 DIAGNOSIS — G8929 Other chronic pain: Secondary | ICD-10-CM

## 2019-02-23 DIAGNOSIS — J301 Allergic rhinitis due to pollen: Secondary | ICD-10-CM | POA: Diagnosis not present

## 2019-02-23 DIAGNOSIS — J4550 Severe persistent asthma, uncomplicated: Secondary | ICD-10-CM

## 2019-02-23 DIAGNOSIS — M51369 Other intervertebral disc degeneration, lumbar region without mention of lumbar back pain or lower extremity pain: Secondary | ICD-10-CM

## 2019-02-23 MED ORDER — MONTELUKAST SODIUM 10 MG PO TABS: ORAL_TABLET | ORAL | 1 refills | Status: DC

## 2019-02-23 MED ORDER — IBUPROFEN 800 MG PO TABS: ORAL_TABLET | ORAL | 1 refills | Status: DC

## 2019-02-23 MED ORDER — ATORVASTATIN CALCIUM 40 MG PO TABS: ORAL_TABLET | ORAL | 1 refills | Status: DC

## 2019-02-23 MED ORDER — FEXOFENADINE HCL 180 MG PO TABS: ORAL_TABLET | ORAL | 1 refills | Status: DC

## 2019-02-23 MED ORDER — LISINOPRIL-HYDROCHLOROTHIAZIDE 10-12.5 MG PO TABS: ORAL_TABLET | ORAL | 1 refills | Status: DC

## 2019-02-23 MED ORDER — ALBUTEROL SULFATE HFA 108 (90 BASE) MCG/ACT IN AERS: 2.0000 | INHALATION_SPRAY | Freq: Four times a day (QID) | RESPIRATORY_TRACT | 1 refills | Status: DC | PRN

## 2019-02-23 NOTE — Progress Notes (Signed)
Date:  02/23/2019   Name:  Marcus Delacruz   DOB:  [birth year]   MRN:  [id number]   Chief Complaint: Hypertension, Hyperlipidemia, Allergic Rhinitis , and Arthritis (takes ibuprofen as needed)  Hypertension This is a chronic problem. The current episode started more than 1 year ago. The problem has been gradually improving since onset. The problem is controlled. Associated symptoms include shortness of breath. Pertinent negatives include no anxiety, blurred vision, chest pain, headaches, malaise/fatigue, neck pain, orthopnea, palpitations, peripheral edema, PND or sweats. There are no associated agents to hypertension. Risk factors for coronary artery disease include dyslipidemia and male gender. Past treatments include ACE inhibitors and diuretics. The current treatment provides moderate improvement. There are no compliance problems.  There is no history of angina, kidney disease, CAD/MI, CVA, heart failure, left ventricular hypertrophy, PVD or retinopathy. There is no history of chronic renal disease, a hypertension causing med or renovascular disease.  Hyperlipidemia This is a chronic problem. The current episode started more than 1 year ago. The problem is controlled. Recent lipid tests were reviewed and are normal. He has no history of chronic renal disease, diabetes, hypothyroidism, liver disease, obesity or nephrotic syndrome. Associated symptoms include shortness of breath. Pertinent negatives include no chest pain, focal sensory loss, focal weakness, leg pain or myalgias. Current antihyperlipidemic treatment includes statins. The current treatment provides moderate improvement of lipids. There are no compliance problems.  Risk factors for coronary artery disease include dyslipidemia and hypertension.  Arthritis Presents for follow-up visit. He complains of pain and stiffness. Affected locations include the left shoulder. His pain is at a severity of 5/10. Associated symptoms include pain  while resting. Pertinent negatives include no diarrhea, dry eyes, dry mouth, dysuria, fatigue, fever, pain at night, rash or weight loss.  Asthma He complains of shortness of breath and wheezing. There is no chest tightness, cough, difficulty breathing, frequent throat clearing, hemoptysis, hoarse voice or sputum production. This is a chronic problem. The problem has been waxing and waning. Pertinent negatives include no appetite change, chest pain, dyspnea on exertion, ear congestion, ear pain, fever, headaches, heartburn, malaise/fatigue, myalgias, nasal congestion, orthopnea, PND, postnasal drip, rhinorrhea, sneezing, sore throat, sweats, trouble swallowing or weight loss. Exacerbated by: positional. His past medical history is significant for asthma.    Lab Results  Component Value Date   CREATININE 1.03 08/23/2018   BUN 17 08/23/2018   NA 139 08/23/2018   K 4.4 08/23/2018   CL 102 08/23/2018   CO2 23 08/23/2018   Lab Results  Component Value Date   CHOL 163 08/23/2018   HDL 60 08/23/2018   LDLCALC 90 08/23/2018   TRIG 67 08/23/2018   CHOLHDL 3.5 08/20/2017   No results found for: TSH No results found for: HGBA1C   Review of Systems  Constitutional: Negative for appetite change, chills, fatigue, fever, malaise/fatigue and weight loss.  HENT: Negative for drooling, ear discharge, ear pain, hoarse voice, postnasal drip, rhinorrhea, sinus pressure, sinus pain, sneezing, sore throat and trouble swallowing.   Eyes: Negative for blurred vision.  Respiratory: Positive for shortness of breath and wheezing. Negative for cough, hemoptysis and sputum production.   Cardiovascular: Negative for chest pain, dyspnea on exertion, palpitations, orthopnea, leg swelling and PND.  Gastrointestinal: Negative for abdominal pain, blood in stool, constipation, diarrhea, heartburn and nausea.  Endocrine: Negative for polydipsia.  Genitourinary: Negative for dysuria, frequency, hematuria and urgency.    Musculoskeletal: Positive for arthritis and stiffness. Negative for back pain, myalgias  and neck pain.  Skin: Negative for rash.  Allergic/Immunologic: Negative for environmental allergies.  Neurological: Negative for dizziness, focal weakness and headaches.  Hematological: Does not bruise/bleed easily.  Psychiatric/Behavioral: Negative for suicidal ideas. The patient is not nervous/anxious.     Patient Active Problem List   Diagnosis Date Noted  . Chronic obstructive pulmonary disease with acute exacerbation ([place]) 07/27/2017  . Asthma, chronic, severe persistent, uncomplicated [id number]  . Seasonal allergic rhinitis due to pollen 08/18/2016  . Bilateral carotid artery stenosis 04/03/2016  . Bilateral hydrocele 11/22/2014  . BPH with obstruction/lower urinary tract symptoms 10/05/2014  . Epididymal cyst 10/05/2014  . Familial multiple lipoprotein-type hyperlipidemia 07/14/2014  . Laboratory animal allergy 07/14/2014  . Acute arthropathy 07/14/2014  . Routine general medical examination at a health care facility 07/14/2014  . Asthma with exacerbation 07/14/2014  . Essential (primary) hypertension 07/14/2014  . Screening for depression 07/14/2014  . Noncompliance w/medication treatment due to intermit use of medication 11/23/2013  . Asthma, chronic 08/08/2013    Allergies  Allergen Reactions  . Codeine Itching    Past Surgical History:  Procedure Laterality Date  . ARTERY REPAIR    . BACK SURGERY    . CHOLECYSTECTOMY    . COLONOSCOPY  2013   cleared  . HERNIA REPAIR      Social History   Tobacco Use  . Smoking status: Former Smoker    Packs/day: 1.00    Years: 20.00    Pack years: 20.00    Types: Cigarettes    Quit date: 08/09/1983    Years since quitting: 35.5  . Smokeless tobacco: Never Used  . Tobacco comment: quit smoking over 40 years ago  Substance Use Topics  . Alcohol use: Yes    Alcohol/week: 0.0 standard drinks    Comment: once monthly  . Drug use:  No     Medication list has been reviewed and updated.  Current Meds  Medication Sig  . albuterol (PROVENTIL HFA;VENTOLIN HFA) 108 (90 Base) MCG/ACT inhaler Inhale 2 puffs into the lungs every 6 (six) hours as needed for wheezing or shortness of breath.  . ALLERGY RELIEF 180 MG tablet TAKE ONE (1) TABLET BY MOUTH ONCE DAILY  . aspirin 81 MG tablet Take 81 mg by mouth daily.  Marland Kitchen atorvastatin (LIPITOR) 40 MG tablet TAKE (1) TABLET BY MOUTH EVERY DAY  . finasteride (PROSCAR) 5 MG tablet TAKE (1) TABLET BY MOUTH EVERY DAY  . ibuprofen (ADVIL) 800 MG tablet TAKE (1) TABLET BY MOUTH THREE TIMES A DAY  . lisinopril-hydrochlorothiazide (ZESTORETIC) 10-12.5 MG tablet TAKE (1) TABLET BY MOUTH EVERY DAY  . montelukast (SINGULAIR) 10 MG tablet TAKE ONE (1) TABLET BY MOUTH ONCE DAILY  . NON FORMULARY cpap device  . TRELEGY ELLIPTA 100-62.5-25 MCG/INH AEPB INHALE 1 PUFF INTO THE LUNGS EVERY DAY   Current Facility-Administered Medications for the 02/23/19 encounter (Office Visit) with Juline Patch, MD  Medication  . ipratropium-albuterol (DUONEB) 0.5-2.5 (3) MG/3ML nebulizer solution 3 mL  . ipratropium-albuterol (DUONEB) 0.5-2.5 (3) MG/3ML nebulizer solution 3 mL    PHQ 2/9 Scores 02/23/2019 02/16/2019 02/19/2018 02/08/2018  PHQ - 2 Score 0 0 0 0  PHQ- 9 Score 0 - 0 -    BP Readings from Last 3 Encounters:  02/23/19 138/64  02/21/19 (!) 160/77  08/30/18 122/62    Physical Exam Vitals and nursing note reviewed.  HENT:     Head: Normocephalic.     Right Ear: Tympanic membrane and external ear normal.  There is no impacted cerumen.     Left Ear: Tympanic membrane, ear canal and external ear normal. There is no impacted cerumen.     Nose: Nose normal. No congestion or rhinorrhea.     Mouth/Throat:     Mouth: Mucous membranes are moist.  Eyes:     General: No scleral icterus.       Right eye: No discharge.        Left eye: No discharge.     Conjunctiva/sclera: Conjunctivae normal.      Pupils: Pupils are equal, round, and reactive to light.  Neck:     Thyroid: No thyromegaly.     Vascular: No JVD.     Trachea: No tracheal deviation.  Cardiovascular:     Rate and Rhythm: Normal rate and regular rhythm.     Pulses: Normal pulses.     Heart sounds: Normal heart sounds. No murmur. No friction rub. No gallop.   Pulmonary:     Effort: No respiratory distress.     Breath sounds: Normal breath sounds. No wheezing or rales.  Abdominal:     General: Bowel sounds are normal.     Palpations: Abdomen is soft. There is no mass.     Tenderness: There is no abdominal tenderness. There is no guarding or rebound.  Musculoskeletal:     Left shoulder: Tenderness present. Decreased range of motion.     Cervical back: Normal range of motion and neck supple.     Comments: Limited abduction/tender bicep/suraspinatis/ ?rotator cuff  Lymphadenopathy:     Cervical: No cervical adenopathy.  Skin:    General: Skin is warm.     Findings: No rash.  Neurological:     Mental Status: He is alert and oriented to person, place, and time.     Cranial Nerves: No cranial nerve deficit.     Deep Tendon Reflexes: Reflexes are normal and symmetric.     Wt Readings from Last 3 Encounters:  02/23/19 189 lb (85.7 kg)  02/21/19 186 lb (84.4 kg)  08/30/18 188 lb (85.3 kg)    BP 138/64   Pulse 64   Ht 5\' 10"  (1.778 m)   Wt 189 lb (85.7 kg)   BMI 27.12 kg/m   Assessment and Plan: 1. Seasonal allergic rhinitis due to pollen Chronic.  Controlled.  Stable.  Episodic pending the season/pollen.  Continue fexofenadine 180 once a day and Singulair 10 mg once a day. - fexofenadine (ALLERGY RELIEF) 180 MG tablet; TAKE ONE (1) TABLET BY MOUTH ONCE DAILY  Dispense: 90 tablet; Refill: 1 - montelukast (SINGULAIR) 10 MG tablet; TAKE ONE (1) TABLET BY MOUTH ONCE DAILY  Dispense: 90 tablet; Refill: 1  2. Familial multiple lipoprotein-type hyperlipidemia Chronic.  Controlled.  Stable.  Continue atorvastatin 40  mg once a day. - atorvastatin (LIPITOR) 40 MG tablet; TAKE (1) TABLET BY MOUTH EVERY DAY  Dispense: 90 tablet; Refill: 1  3. Degenerative disc disease, lumbar Chronic.  Controlled.  Stable.  Continue ibuprofen 800 mg 3 times a day as needed pain - ibuprofen (ADVIL) 800 MG tablet; TAKE (1) TABLET BY MOUTH THREE TIMES A DAY  Dispense: 90 tablet; Refill: 1  4. Essential (primary) hypertension Chronic.  Controlled.  Stable.  Continue lisinopril 10-12.5 mg once a day. - lisinopril-hydrochlorothiazide (ZESTORETIC) 10-12.5 MG tablet; TAKE (1) TABLET BY MOUTH EVERY DAY  Dispense: 90 tablet; Refill: 1 - Renal Function Panel  5. Asthma, chronic, severe persistent, uncomplicated Recurrent asthma which is intermittent and mild.  Continue Singulair 10 mg once a day and Ventolin inhaler 1 to 2 puffs every 6 hours as needed - montelukast (SINGULAIR) 10 MG tablet; TAKE ONE (1) TABLET BY MOUTH ONCE DAILY  Dispense: 90 tablet; Refill: 1 - albuterol (VENTOLIN HFA) 108 (90 Base) MCG/ACT inhaler; Inhale 2 puffs into the lungs every 6 (six) hours as needed for wheezing or shortness of breath.  Dispense: 18 g; Refill: 1  6. Elevated AST (SGOT) Previous history of elevated AST and will check hepatic function panel.  Patient is also on statin agent for which we will evaluate for hepatotoxicity. - Hepatic function panel  7. Chronic left shoulder pain Chronic for greater than 2 years uncertain whether it was secondary to an injury.  Referral to orthopedics for evaluation and treatment.  In the meantime we will initiate ibuprofen 800 mg 3 times a day.- ibuprofen (ADVIL) 800 MG tablet; TAKE (1) TABLET BY MOUTH THREE TIMES A DAY  Dispense: 90 tablet; Refill: 1 - Ambulatory referral to Orthopedic Surgery

## 2019-02-24 LAB — HEPATIC FUNCTION PANEL
ALT: 24 IU/L (ref 0–44)
AST: 60 IU/L — ABNORMAL HIGH (ref 0–40)
Alkaline Phosphatase: 65 IU/L (ref 39–117)
Bilirubin Total: 0.3 mg/dL (ref 0.0–1.2)
Bilirubin, Direct: 0.12 mg/dL (ref 0.00–0.40)
Total Protein: 6.9 g/dL (ref 6.0–8.5)

## 2019-02-24 LAB — RENAL FUNCTION PANEL
Albumin: 4.3 g/dL (ref 3.7–4.7)
BUN/Creatinine Ratio: 18 (ref 10–24)
BUN: 19 mg/dL (ref 8–27)
CO2: 24 mmol/L (ref 20–29)
Calcium: 9.6 mg/dL (ref 8.6–10.2)
Chloride: 106 mmol/L (ref 96–106)
Creatinine, Ser: 1.05 mg/dL (ref 0.76–1.27)
GFR calc Af Amer: 79 mL/min/{1.73_m2} (ref >59)
GFR calc non Af Amer: 68 mL/min/{1.73_m2} (ref >59)
Glucose: 95 mg/dL (ref 65–99)
Phosphorus: 3 mg/dL (ref 2.8–4.1)
Potassium: 4.8 mmol/L (ref 3.5–5.2)
Sodium: 144 mmol/L (ref 134–144)

## 2019-02-28 DIAGNOSIS — G4733 Obstructive sleep apnea (adult) (pediatric): Secondary | ICD-10-CM | POA: Diagnosis not present

## 2019-03-02 ENCOUNTER — Telehealth: Payer: Self-pay

## 2019-03-02 DIAGNOSIS — M7552 Bursitis of left shoulder: Secondary | ICD-10-CM | POA: Diagnosis not present

## 2019-03-02 NOTE — Telephone Encounter (Signed)
Confirmed appointment with patient. klh °

## 2019-03-07 ENCOUNTER — Encounter: Payer: Self-pay | Admitting: Internal Medicine

## 2019-03-07 ENCOUNTER — Ambulatory Visit: Payer: Medicare PPO | Admitting: Internal Medicine

## 2019-03-07 ENCOUNTER — Other Ambulatory Visit: Payer: Self-pay

## 2019-03-07 VITALS — BP 138/62 | HR 63 | Resp 16 | Ht 70.0 in | Wt 190.2 lb

## 2019-03-07 DIAGNOSIS — G4733 Obstructive sleep apnea (adult) (pediatric): Secondary | ICD-10-CM | POA: Diagnosis not present

## 2019-03-07 DIAGNOSIS — R0602 Shortness of breath: Secondary | ICD-10-CM

## 2019-03-07 DIAGNOSIS — I10 Essential (primary) hypertension: Secondary | ICD-10-CM | POA: Diagnosis not present

## 2019-03-07 DIAGNOSIS — J449 Chronic obstructive pulmonary disease, unspecified: Secondary | ICD-10-CM

## 2019-03-07 DIAGNOSIS — Z9989 Dependence on other enabling machines and devices: Secondary | ICD-10-CM | POA: Diagnosis not present

## 2019-03-07 NOTE — Progress Notes (Signed)
[facility] [place], [place] [address]  Pulmonary Sleep Medicine   Office Visit Note  Patient Name: Marcus Delacruz DOB: 1940/11/04 MRN [id number]  Date of Service: 03/07/2019  Complaints/HPI: Patient is here today to follow-up on his COPD and OSA. Wears CPAP nightly, reports compliance with wearing his CPAP, cleaning properly with cleaning machine and changing tubing and filters as needed. Denies increased shortness of breath, wheezing or coughing. Feels as though his current regimen of Trelegy is managing his symptoms at this time, not having to use his rescue inhaler or nebulizer treatments. Blood pressure is slightly elevated today, is currently taking lisinopril-HCTZ daily. Blood pressure was also slightly elevated at his orthopedic appointment a few weeks ago. Recently had a physical with his PCP and does not recall his BP being elevated at that time. Denies chest pain, palpitations, dizziness or headaches.  ROS  General: (-) fever, (-) chills, (-) night sweats, (-) weakness Skin: (-) rashes, (-) itching,. Eyes: (-) visual changes, (-) redness, (-) itching. Nose and Sinuses: (-) nasal stuffiness or itchiness, (-) postnasal drip, (-) nosebleeds, (-) sinus trouble. Mouth and Throat: (-) sore throat, (-) hoarseness. Neck: (-) swollen glands, (-) enlarged thyroid, (-) neck pain. Respiratory: - cough, (-) bloody sputum, - shortness of breath, - wheezing. Cardiovascular: - ankle swelling, (-) chest pain. Lymphatic: (-) lymph node enlargement. Neurologic: (-) numbness, (-) tingling. Psychiatric: (-) anxiety, (-) depression   Current Medication: Outpatient Encounter Medications as of 03/07/2019  Medication Sig  . albuterol (VENTOLIN HFA) 108 (90 Base) MCG/ACT inhaler Inhale 2 puffs into the lungs every 6 (six) hours as needed for wheezing or shortness of breath.  Marland Kitchen aspirin 81 MG tablet Take 81 mg by mouth daily.  Marland Kitchen atorvastatin (LIPITOR) 40 MG tablet  TAKE (1) TABLET BY MOUTH EVERY DAY  . fexofenadine (ALLERGY RELIEF) 180 MG tablet TAKE ONE (1) TABLET BY MOUTH ONCE DAILY  . finasteride (PROSCAR) 5 MG tablet TAKE (1) TABLET BY MOUTH EVERY DAY  . ibuprofen (ADVIL) 800 MG tablet TAKE (1) TABLET BY MOUTH THREE TIMES A DAY  . ipratropium-albuterol (DUONEB) 0.5-2.5 (3) MG/3ML SOLN TAKE 3MLS BY NEBULIZATION EVERY 6 HOURS AS NEEDED  . lisinopril-hydrochlorothiazide (ZESTORETIC) 10-12.5 MG tablet TAKE (1) TABLET BY MOUTH EVERY DAY  . montelukast (SINGULAIR) 10 MG tablet TAKE ONE (1) TABLET BY MOUTH ONCE DAILY  . NON FORMULARY cpap device  . TRELEGY ELLIPTA 100-62.5-25 MCG/INH AEPB INHALE 1 PUFF INTO THE LUNGS EVERY DAY   Facility-Administered Encounter Medications as of 03/07/2019  Medication  . ipratropium-albuterol (DUONEB) 0.5-2.5 (3) MG/3ML nebulizer solution 3 mL  . ipratropium-albuterol (DUONEB) 0.5-2.5 (3) MG/3ML nebulizer solution 3 mL    Surgical History: Past Surgical History:  Procedure Laterality Date  . ARTERY REPAIR    . BACK SURGERY    . CHOLECYSTECTOMY    . COLONOSCOPY  2013   cleared  . HERNIA REPAIR      Medical History: Past Medical History:  Diagnosis Date  . Allergic rhinitis due to pollen   . Asthma   . BPH (benign prostatic hyperplasia)   . Cardiomegaly   . Chronic obstructive pulmonary disease (COPD) ([place])   . Hypercholesteremia   . Hypersomnia, unspecified   . Hypertension   . Obstructive sleep apnea (adult) (pediatric)   . Shortness of breath   . Snoring   . Solitary pulmonary nodule     Family History: Family History  Problem Relation Age of Onset  . COPD Mother   .  Asthma Son   . Cancer Father        lung  . Prostate cancer Neg Hx   . Kidney disease Neg Hx     Social History: Social History   Socioeconomic History  . Marital status: Married    Spouse name: Not on file  . Number of children: 3  . Years of education: Not on file  . Highest education level: Associate degree: academic  program  Occupational History  . Occupation: retired  Tobacco Use  . Smoking status: Former Smoker    Packs/day: 1.00    Years: 20.00    Pack years: 20.00    Types: Cigarettes    Quit date: 08/09/1983    Years since quitting: 35.6  . Smokeless tobacco: Never Used  . Tobacco comment: quit smoking over 40 years ago  Substance and Sexual Activity  . Alcohol use: Yes    Alcohol/week: 0.0 standard drinks    Comment: once monthly  . Drug use: No  . Sexual activity: Not on file  Other Topics Concern  . Not on file  Social History Narrative  . Not on file   Social Determinants of Health   Financial Resource Strain:   . Difficulty of Paying Living Expenses: Not on file  Food Insecurity:   . Worried About Charity fundraiser in the Last Year: Not on file  . Ran Out of Food in the Last Year: Not on file  Transportation Needs:   . Lack of Transportation (Medical): Not on file  . Lack of Transportation (Non-Medical): Not on file  Physical Activity:   . Days of Exercise per Week: Not on file  . Minutes of Exercise per Session: Not on file  Stress: No Stress Concern Present  . Feeling of Stress : Not at all  Social Connections:   . Frequency of Communication with Friends and Family: Not on file  . Frequency of Social Gatherings with Friends and Family: Not on file  . Attends Religious Services: Not on file  . Active Member of Clubs or Organizations: Not on file  . Attends Archivist Meetings: Not on file  . Marital Status: Not on file  Intimate Partner Violence:   . Fear of Current or Ex-Partner: Not on file  . Emotionally Abused: Not on file  . Physically Abused: Not on file  . Sexually Abused: Not on file    Vital Signs: Blood pressure (!) 152/68, pulse 63, resp. rate 16, height 5\' 10"  (1.778 m), weight 190 lb 3.2 oz (86.3 kg), SpO2 97 %.  Examination: General Appearance: The patient is well-developed, well-nourished, and in no distress. Skin: Gross inspection of  skin unremarkable. Head: normocephalic, no gross deformities. Eyes: no gross deformities noted. ENT: ears appear grossly normal no exudates. Neck: Supple. No thyromegaly. No LAD. Respiratory: Clear bilaterally. Cardiovascular: Normal S1 and S2 without murmur or rub. Extremities: No cyanosis. pulses are equal. Neurologic: Alert and oriented. No involuntary movements.  LABS: Recent Results (from the past 2160 hour(s))  Hepatic function panel     Status: Abnormal   Collection Time: 02/23/19  9:59 AM  Result Value Ref Range   Total Protein 6.9 6.0 - 8.5 g/dL   Bilirubin Total 0.3 0.0 - 1.2 mg/dL   Bilirubin, Direct 0.12 0.00 - 0.40 mg/dL   Alkaline Phosphatase 65 39 - 117 IU/L   AST 60 (H) 0 - 40 IU/L   ALT 24 0 - 44 IU/L  Renal Function Panel  Status: None   Collection Time: 02/23/19  9:59 AM  Result Value Ref Range   Glucose 95 65 - 99 mg/dL   BUN 19 8 - 27 mg/dL   Creatinine, Ser 1.05 0.76 - 1.27 mg/dL   GFR calc non Af Amer 68 >59 mL/min/1.73   GFR calc Af Amer 79 >59 mL/min/1.73   BUN/Creatinine Ratio 18 10 - 24   Sodium 144 134 - 144 mmol/L   Potassium 4.8 3.5 - 5.2 mmol/L   Chloride 106 96 - 106 mmol/L   CO2 24 20 - 29 mmol/L   Calcium 9.6 8.6 - 10.2 mg/dL   Phosphorus 3.0 2.8 - 4.1 mg/dL   Albumin 4.3 3.7 - 4.7 g/dL    Radiology: DG Shoulder Right  Result Date: 01/08/2018 CLINICAL DATA:  persistent right shoulder pain EXAM: RIGHT SHOULDER - 2+ VIEW COMPARISON:  None. FINDINGS: No acute fracture. No dislocation.  Unremarkable soft tissues. IMPRESSION: No acute bony pathology. Electronically Signed   By: Marybelle Killings M.D.   On: 01/08/2018 07:16    No results found.  No results found.    Assessment and Plan: Patient Active Problem List   Diagnosis Date Noted  . Chronic obstructive pulmonary disease with acute exacerbation ([place]) 07/27/2017  . Asthma, chronic, severe persistent, uncomplicated [id number]  . Seasonal allergic rhinitis due to pollen 08/18/2016   . Bilateral carotid artery stenosis 04/03/2016  . Bilateral hydrocele 11/22/2014  . BPH with obstruction/lower urinary tract symptoms 10/05/2014  . Epididymal cyst 10/05/2014  . Familial multiple lipoprotein-type hyperlipidemia 07/14/2014  . Laboratory animal allergy 07/14/2014  . Acute arthropathy 07/14/2014  . Routine general medical examination at a health care facility 07/14/2014  . Asthma with exacerbation 07/14/2014  . Essential (primary) hypertension 07/14/2014  . Screening for depression 07/14/2014  . Noncompliance w/medication treatment due to intermit use of medication 11/23/2013  . Asthma, chronic 08/08/2013    1. Chronic obstructive pulmonary disease, unspecified COPD type ([place]) Stable on current therapy, continue to monitor.  2. OSA on CPAP Reports nightly complaints with his CPAP machine. Stable at this time, continue to monitor.  3. Shortness of breath Stable at this time on current therapy, continue to monitor. - Spirometry with graph  4. Essential hypertension Slightly elevated blood pressure, repeat check of blood pressure much improved than initial reading. Encouraged to follow-up with his PCP.  General Counseling: I have discussed the findings of the evaluation and examination with Latrelle.  I have also discussed any further diagnostic evaluation thatmay be needed or ordered today. Dontarius verbalizes understanding of the findings of todays visit. We also reviewed his medications today and discussed drug interactions and side effects including but not limited excessive drowsiness and altered mental states. We also discussed that there is always a risk not just to him but also people around him. he has been encouraged to call the office with any questions or concerns that should arise related to todays visit.  Orders Placed This Encounter  Procedures  . Spirometry with graph    Order Specific Question:   Where should this test be performed?    Answer:   [facility]     Time spent: 20 This patient was seen by Orson Gear AGNP-C in Collaboration with Dr. Devona Konig as a part of collaborative care agreement.  I have personally obtained a history, examined the patient, evaluated laboratory and imaging results, formulated the assessment and plan and placed orders.    Allyne Gee, MD [facility]  Pulmonary and Critical Care Sleep medicine

## 2019-03-14 ENCOUNTER — Telehealth (INDEPENDENT_AMBULATORY_CARE_PROVIDER_SITE_OTHER): Payer: Self-pay | Admitting: Vascular Surgery

## 2019-03-14 NOTE — Telephone Encounter (Signed)
PATIENT CALLED TO SEE WHEN HE NEEDS ANOTHER APPT. I RESEARCHED AND WAS UNABLE TO FIND PATIENT PLAN OF CARE PLEASE ADVISE. LOOKS LIKE HE WAS HAVE HIS CAROTIDS MONITORED FROM PREVIOUS VISITS BUT WAS NOT SEEN BY PROVIDER LAST ULTRASOUND VISIT AND NO LETTER SENT.

## 2019-03-14 NOTE — Telephone Encounter (Signed)
Please advise 

## 2019-03-14 NOTE — Telephone Encounter (Signed)
See Arna Medici Brown's note below. Thank you.

## 2019-03-14 NOTE — Telephone Encounter (Signed)
Looks like he was last seen 04/2017.  Based on his results it is generally an annual follow up. So he should be seen for a carotid duplex at his convenience.

## 2019-03-29 ENCOUNTER — Encounter (INDEPENDENT_AMBULATORY_CARE_PROVIDER_SITE_OTHER): Payer: Medicare Other

## 2019-03-29 ENCOUNTER — Ambulatory Visit (INDEPENDENT_AMBULATORY_CARE_PROVIDER_SITE_OTHER): Payer: Medicare Other | Admitting: Nurse Practitioner

## 2019-04-11 ENCOUNTER — Other Ambulatory Visit: Payer: Self-pay | Admitting: Family Medicine

## 2019-04-11 DIAGNOSIS — J301 Allergic rhinitis due to pollen: Secondary | ICD-10-CM

## 2019-04-11 IMAGING — CR DG CHEST 2V
2 series · 2 of 2 positions shown · non-contrast
Comparison: November 23, 2013

CLINICAL DATA: Nonproductive cough.  Shortness of breath.

EXAM:
CHEST - 2 VIEW

[chest pa]
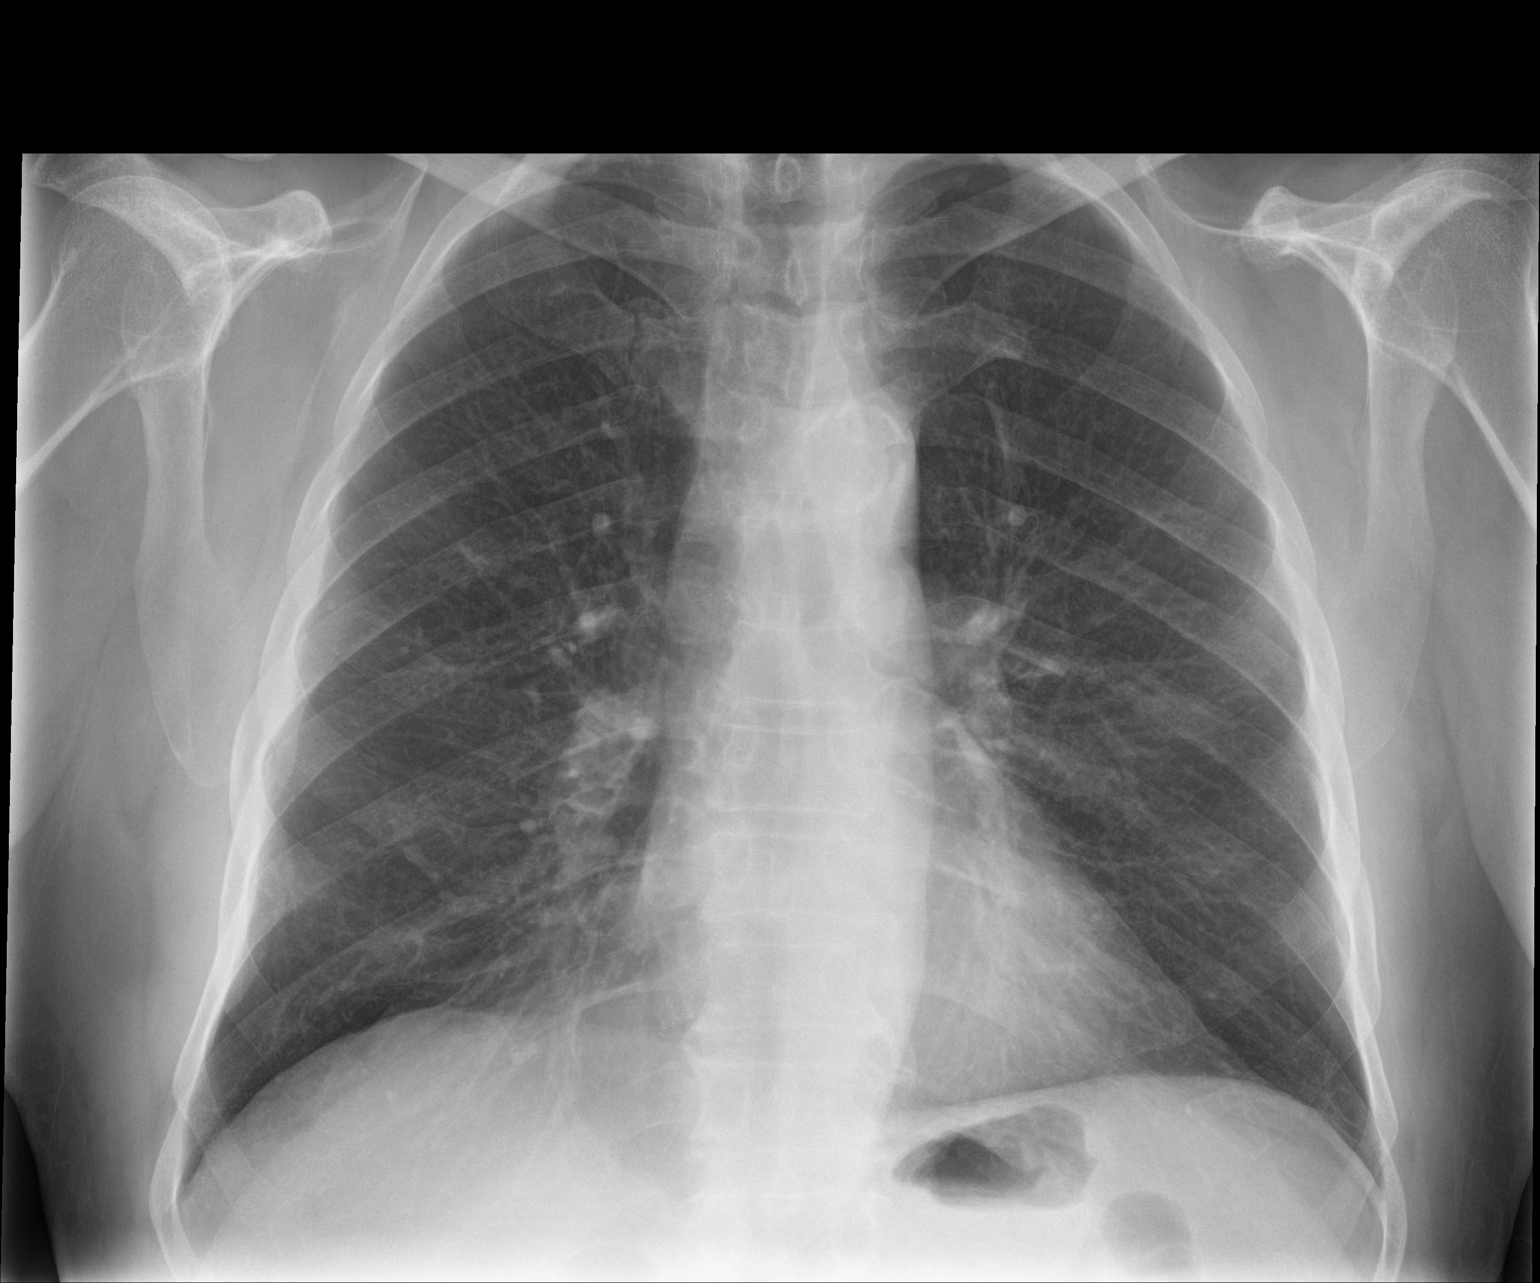

[chest lat]
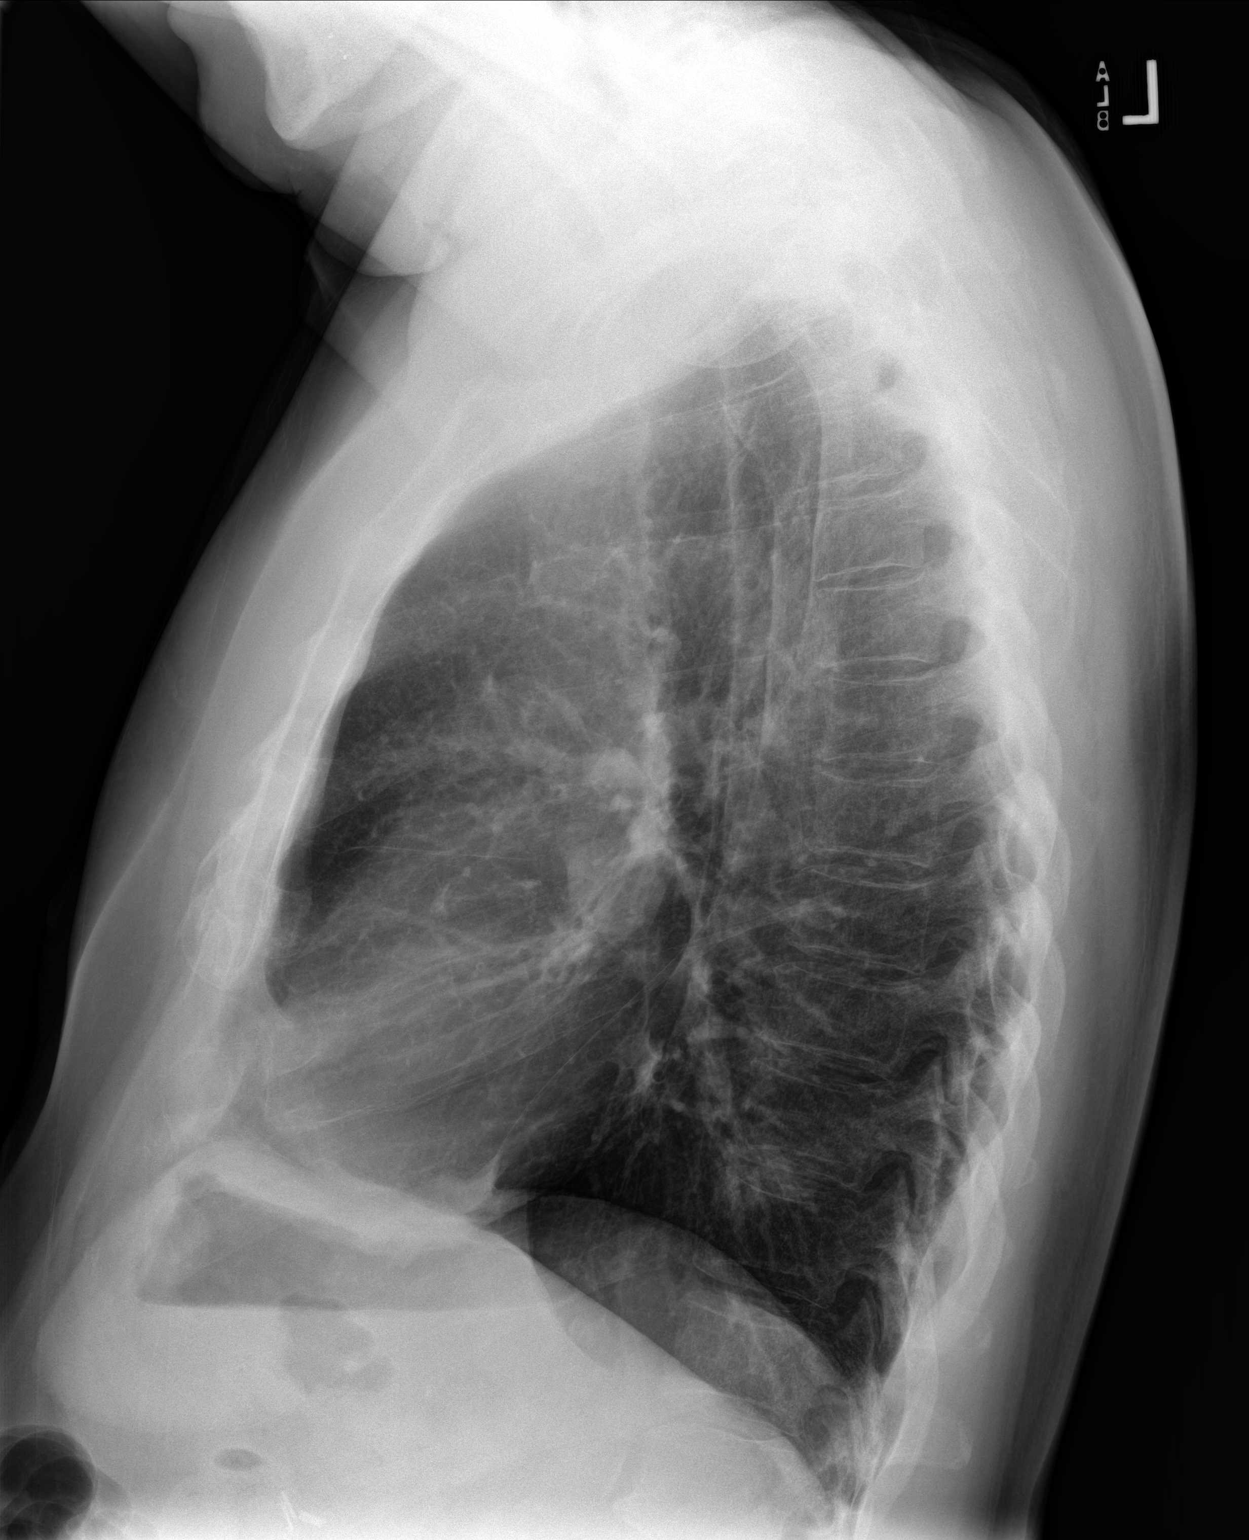

[2 of 2 positions shown; findings below may reference images not displayed]

FINDINGS: The heart size and mediastinal contours are within normal limits.
Both lungs are clear. The visualized skeletal structures are
unremarkable.
IMPRESSION: No active cardiopulmonary disease.

## 2019-05-02 ENCOUNTER — Other Ambulatory Visit: Payer: Self-pay | Admitting: Family Medicine

## 2019-05-02 DIAGNOSIS — J4550 Severe persistent asthma, uncomplicated: Secondary | ICD-10-CM

## 2019-05-06 ENCOUNTER — Other Ambulatory Visit: Payer: Self-pay

## 2019-05-06 DIAGNOSIS — J449 Chronic obstructive pulmonary disease, unspecified: Secondary | ICD-10-CM

## 2019-05-06 MED ORDER — TRELEGY ELLIPTA 100-62.5-25 MCG/INH IN AEPB: 1.0000 | INHALATION_SPRAY | Freq: Every day | RESPIRATORY_TRACT | 3 refills | Status: DC

## 2019-05-16 ENCOUNTER — Encounter (INDEPENDENT_AMBULATORY_CARE_PROVIDER_SITE_OTHER): Payer: Medicare Other

## 2019-05-16 ENCOUNTER — Ambulatory Visit (INDEPENDENT_AMBULATORY_CARE_PROVIDER_SITE_OTHER): Payer: Medicare Other | Admitting: Vascular Surgery

## 2019-05-19 ENCOUNTER — Other Ambulatory Visit (INDEPENDENT_AMBULATORY_CARE_PROVIDER_SITE_OTHER): Payer: Self-pay | Admitting: Nurse Practitioner

## 2019-05-19 DIAGNOSIS — I6523 Occlusion and stenosis of bilateral carotid arteries: Secondary | ICD-10-CM

## 2019-05-20 ENCOUNTER — Encounter (INDEPENDENT_AMBULATORY_CARE_PROVIDER_SITE_OTHER): Payer: Self-pay | Admitting: Nurse Practitioner

## 2019-05-20 ENCOUNTER — Ambulatory Visit (INDEPENDENT_AMBULATORY_CARE_PROVIDER_SITE_OTHER): Payer: Medicare Other | Admitting: Nurse Practitioner

## 2019-05-20 ENCOUNTER — Ambulatory Visit (INDEPENDENT_AMBULATORY_CARE_PROVIDER_SITE_OTHER): Payer: Medicare PPO

## 2019-05-20 ENCOUNTER — Other Ambulatory Visit: Payer: Self-pay

## 2019-05-20 VITALS — BP 131/68 | HR 69 | Resp 16 | Wt 185.0 lb

## 2019-05-20 DIAGNOSIS — I6523 Occlusion and stenosis of bilateral carotid arteries: Secondary | ICD-10-CM

## 2019-05-20 DIAGNOSIS — I10 Essential (primary) hypertension: Secondary | ICD-10-CM

## 2019-05-20 NOTE — Progress Notes (Signed)
SUBJECTIVE:  Patient ID: Marcus Delacruz, male    DOB: 05-Sep-1940, 79 y.o.   MRN: [id number] Chief Complaint  Patient presents with  . Follow-up    ultrasound follow up    HPI  Marcus Delacruz is a 79 y.o. male The patient is seen for follow up evaluation of carotid stenosis. The carotid stenosis followed by ultrasound.   The patient denies amaurosis fugax. There is no recent history of TIA symptoms or focal motor deficits. There is no prior documented CVA.  The patient is taking enteric-coated aspirin 81 mg daily.  There is no history of migraine headaches. There is no history of seizures.  The patient has a history of coronary artery disease, no recent episodes of angina or shortness of breath. The patient denies PAD or claudication symptoms. There is a history of hyperlipidemia which is being treated with a statin.    Carotid Duplex done today shows 1 to 39% stenosis of the right internal carotid artery with 40 to 59% stenosis of the left.  No change compared to last study in 04/30/2017.  Past Medical History:  Diagnosis Date  . Allergic rhinitis due to pollen   . Asthma   . BPH (benign prostatic hyperplasia)   . Cardiomegaly   . Chronic obstructive pulmonary disease (COPD) ([place])   . Hypercholesteremia   . Hypersomnia, unspecified   . Hypertension   . Obstructive sleep apnea (adult) (pediatric)   . Shortness of breath   . Snoring   . Solitary pulmonary nodule     Past Surgical History:  Procedure Laterality Date  . ARTERY REPAIR    . BACK SURGERY    . CHOLECYSTECTOMY    . COLONOSCOPY  2013   cleared  . HERNIA REPAIR      Social History   Socioeconomic History  . Marital status: Married    Spouse name: Not on file  . Number of children: 3  . Years of education: Not on file  . Highest education level: Associate degree: academic program  Occupational History  . Occupation: retired  Tobacco Use  . Smoking status: Former Smoker    Packs/day:  1.00    Years: 20.00    Pack years: 20.00    Types: Cigarettes    Quit date: 08/09/1983    Years since quitting: 35.8  . Smokeless tobacco: Never Used  . Tobacco comment: quit smoking over 40 years ago  Substance and Sexual Activity  . Alcohol use: Yes    Alcohol/week: 0.0 standard drinks    Comment: once monthly  . Drug use: No  . Sexual activity: Not on file  Other Topics Concern  . Not on file  Social History Narrative  . Not on file   Social Determinants of Health   Financial Resource Strain:   . Difficulty of Paying Living Expenses:   Food Insecurity:   . Worried About Charity fundraiser in the Last Year:   . Arboriculturist in the Last Year:   Transportation Needs:   . Film/video editor (Medical):   Marland Kitchen Lack of Transportation (Non-Medical):   Physical Activity:   . Days of Exercise per Week:   . Minutes of Exercise per Session:   Stress: No Stress Concern Present  . Feeling of Stress : Not at all  Social Connections:   . Frequency of Communication with Friends and Family:   . Frequency of Social Gatherings with Friends and Family:   . Attends Religious  Services:   . Active Member of Clubs or Organizations:   . Attends Archivist Meetings:   Marland Kitchen Marital Status:   Intimate Partner Violence:   . Fear of Current or Ex-Partner:   . Emotionally Abused:   Marland Kitchen Physically Abused:   . Sexually Abused:     Family History  Problem Relation Age of Onset  . COPD Mother   . Asthma Son   . Cancer Father        lung  . Prostate cancer Neg Hx   . Kidney disease Neg Hx     Allergies  Allergen Reactions  . Codeine Itching     Review of Systems   Review of Systems: Negative Unless Checked Constitutional: [] Weight loss  [] Fever  [] Chills Cardiac: [] Chest pain   []  Atrial Fibrillation  [] Palpitations   [] Shortness of breath when laying flat   [] Shortness of breath with exertion. [] Shortness of breath at rest Vascular:  [] Pain in legs with walking   [] Pain  in legs with standing [] Pain in legs when laying flat   [] Claudication    [] Pain in feet when laying flat    [] History of DVT   [] Phlebitis   [] Swelling in legs   [] Varicose veins   [] Non-healing ulcers Pulmonary:   [] Uses home oxygen   [] Productive cough   [] Hemoptysis   [] Wheeze  [x] COPD   [x] Asthma Neurologic:  [] Dizziness   [] Seizures  [] Blackouts [] History of stroke   [] History of TIA  [] Aphasia   [] Temporary Blindness   [] Weakness or numbness in arm   [] Weakness or numbness in leg Musculoskeletal:   [] Joint swelling   [] Joint pain   [] Low back pain  []  History of Knee Replacement [] Arthritis [] back Surgeries  []  Spinal Stenosis    Hematologic:  [] Easy bruising  [] Easy bleeding   [] Hypercoagulable state   [] Anemic Gastrointestinal:  [] Diarrhea   [] Vomiting  [] Gastroesophageal reflux/heartburn   [] Difficulty swallowing. [] Abdominal pain Genitourinary:  [] Chronic kidney disease   [] Difficult urination  [] Anuric   [] Blood in urine [] Frequent urination  [] Burning with urination   [] Hematuria Skin:  [] Rashes   [] Ulcers [] Wounds Psychological:  [] History of anxiety   []  History of major depression  []  Memory Difficulties      OBJECTIVE:   Physical Exam  BP 131/68 (BP Location: Right Arm)   Pulse 69   Resp 16   Wt 185 lb (83.9 kg)   BMI 26.54 kg/m   Gen: WD/WN, NAD Head: [place]/AT, No temporalis wasting.  Ear/Nose/Throat: Hearing grossly intact, nares w/o erythema or drainage Eyes: PER, EOMI, sclera nonicteric.  Neck: Supple, no masses.  No JVD.  Pulmonary:  Good air movement, no use of accessory muscles.  Cardiac: RRR Vascular:  Vessel Right Left  Radial Palpable Palpable   Gastrointestinal: soft, non-distended. No guarding/no peritoneal signs.  Musculoskeletal: M/S 5/5 throughout.  No deformity or atrophy.  Neurologic: Pain and light touch intact in extremities.  Symmetrical.  Speech is fluent. Motor exam as listed above. Psychiatric: Judgment intact, Mood & affect appropriate for  pt's clinical situation.      ASSESSMENT AND PLAN:  1. Bilateral carotid artery stenosis Recommend:  Given the patient's asymptomatic subcritical stenosis no further invasive testing or surgery at this time.  Carotid Duplex done today shows 1 to 39% stenosis of the right internal carotid artery with 40 to 59% stenosis of the left.  No change compared to last study in 04/30/2017.  Continue antiplatelet therapy as prescribed Continue management of CAD, HTN  and Hyperlipidemia Healthy heart diet,  encouraged exercise at least 4 times per week Follow up in 12 months with duplex ultrasound and physical exam   2. Essential (primary) hypertension Continue antihypertensive medications as already ordered, these medications have been reviewed and there are no changes at this time.    Current Outpatient Medications on File Prior to Visit  Medication Sig Dispense Refill  . albuterol (VENTOLIN HFA) 108 (90 Base) MCG/ACT inhaler INHALE 2 PUFFS INTO THE LUNGS EVERY 6 HOURS AS NEEDED FOR WHEEZING OR SHORTNESS OF BREATH 8.5 g 1  . ALLERGY RELIEF 180 MG tablet TAKE ONE (1) TABLET BY MOUTH ONCE DAILY 90 tablet 0  . aspirin 81 MG tablet Take 81 mg by mouth daily.    Marland Kitchen atorvastatin (LIPITOR) 40 MG tablet TAKE (1) TABLET BY MOUTH EVERY DAY 90 tablet 1  . finasteride (PROSCAR) 5 MG tablet TAKE (1) TABLET BY MOUTH EVERY DAY 90 tablet 3  . Fluticasone-Umeclidin-Vilant (TRELEGY ELLIPTA) 100-62.5-25 MCG/INH AEPB Inhale 1 puff into the lungs daily. 60 each 3  . ibuprofen (ADVIL) 800 MG tablet TAKE (1) TABLET BY MOUTH THREE TIMES A DAY 90 tablet 1  . lisinopril-hydrochlorothiazide (ZESTORETIC) 10-12.5 MG tablet TAKE (1) TABLET BY MOUTH EVERY DAY 90 tablet 1  . meloxicam (MOBIC) 7.5 MG tablet Take 7.5 mg by mouth as needed for pain.    . montelukast (SINGULAIR) 10 MG tablet TAKE ONE (1) TABLET BY MOUTH ONCE DAILY 90 tablet 1  . NON FORMULARY cpap device    . ipratropium-albuterol (DUONEB) 0.5-2.5 (3) MG/3ML SOLN  TAKE 3MLS BY NEBULIZATION EVERY 6 HOURS AS NEEDED (Patient not taking: Reported on 05/20/2019) 360 mL 0   Current Facility-Administered Medications on File Prior to Visit  Medication Dose Route Frequency Provider Last Rate Last Admin  . ipratropium-albuterol (DUONEB) 0.5-2.5 (3) MG/3ML nebulizer solution 3 mL  3 mL Nebulization Once Jones, Deanna C, MD      . ipratropium-albuterol (DUONEB) 0.5-2.5 (3) MG/3ML nebulizer solution 3 mL  3 mL Nebulization Once Juline Patch, MD        There are no Patient Instructions on file for this visit. No follow-ups on file.   Kris Hartmann, NP  This note was completed with Sales executive.  Any errors are purely unintentional.

## 2019-06-01 DIAGNOSIS — G4733 Obstructive sleep apnea (adult) (pediatric): Secondary | ICD-10-CM | POA: Diagnosis not present

## 2019-08-03 ENCOUNTER — Ambulatory Visit: Payer: Medicare PPO

## 2019-08-09 ENCOUNTER — Other Ambulatory Visit: Payer: Self-pay | Admitting: Family Medicine

## 2019-08-09 DIAGNOSIS — M25512 Pain in left shoulder: Secondary | ICD-10-CM

## 2019-08-09 DIAGNOSIS — J4550 Severe persistent asthma, uncomplicated: Secondary | ICD-10-CM

## 2019-08-09 DIAGNOSIS — E7849 Other hyperlipidemia: Secondary | ICD-10-CM

## 2019-08-09 DIAGNOSIS — M5136 Other intervertebral disc degeneration, lumbar region: Secondary | ICD-10-CM

## 2019-08-09 NOTE — Telephone Encounter (Signed)
Requested Prescriptions  Pending Prescriptions Disp Refills   ibuprofen (ADVIL) 800 MG tablet [Pharmacy Med Name: IBUPROFEN 800 MG TAB] 90 tablet 1    Sig: TAKE (1) TABLET BY MOUTH THREE TIMES A DAY     Analgesics:  NSAIDS Failed - 08/09/2019  3:13 PM      Failed - HGB in normal range and within 360 days    Hemoglobin  Date Value Ref Range Status  07/10/2014 15.7 13.5 - 17.5 g/dL Final         Passed - Cr in normal range and within 360 days    Creatinine, Ser  Date Value Ref Range Status  02/23/2019 1.05 0.76 - 1.27 mg/dL Final         Passed - Patient is not pregnant      Passed - Valid encounter within last 12 months    Recent Outpatient Visits          5 months ago Elevated AST (SGOT)   [facility] Juline Patch, MD   11 months ago Encounter for annual physical exam   [place], Deanna C, MD   1 year ago Essential (primary) hypertension   [facility] Juline Patch, MD   1 year ago Acute pain of right shoulder   [facility] Juline Patch, MD   1 year ago Acute pain of right shoulder   [facility] Juline Patch, MD      Future Appointments            In 6 days Juline Patch, MD [facility], [place]   In 6 months McGowan, Shannon A, [place]

## 2019-08-09 NOTE — Telephone Encounter (Signed)
Requested Prescriptions  Pending Prescriptions Disp Refills  . atorvastatin (LIPITOR) 40 MG tablet [Pharmacy Med Name: ATORVASTATIN CALCIUM 40 MG TAB] 90 tablet 1    Sig: TAKE (1) TABLET BY MOUTH EVERY DAY     Cardiovascular:  Antilipid - Statins Passed - 08/09/2019  3:12 PM      Passed - Total Cholesterol in normal range and within 360 days    Cholesterol, Total  Date Value Ref Range Status  08/23/2018 163 100 - 199 mg/dL Final         Passed - LDL in normal range and within 360 days    LDL Calculated  Date Value Ref Range Status  08/23/2018 90 0 - 99 mg/dL Final         Passed - HDL in normal range and within 360 days    HDL  Date Value Ref Range Status  08/23/2018 60 >39 mg/dL Final         Passed - Triglycerides in normal range and within 360 days    Triglycerides  Date Value Ref Range Status  08/23/2018 67 0 - 149 mg/dL Final         Passed - Patient is not pregnant      Passed - Valid encounter within last 12 months    Recent Outpatient Visits          5 months ago Elevated AST (SGOT)   [facility] Juline Patch, MD   11 months ago Encounter for annual physical exam   [place], Deanna C, MD   1 year ago Essential (primary) hypertension   [facility] Juline Patch, MD   1 year ago Acute pain of right shoulder   [facility] Juline Patch, MD   1 year ago Acute pain of right shoulder   [facility] Juline Patch, MD      Future Appointments            In 6 days Juline Patch, MD [facility], [place]   In 6 months McGowan, Gordan Payment [place]

## 2019-08-15 ENCOUNTER — Ambulatory Visit: Payer: Medicare PPO | Admitting: Family Medicine

## 2019-08-16 ENCOUNTER — Telehealth: Payer: Self-pay

## 2019-08-16 NOTE — Telephone Encounter (Signed)
Confirmed appointment on 08/17/2019 and screened for covid. klh

## 2019-08-17 ENCOUNTER — Ambulatory Visit: Payer: Medicare PPO

## 2019-08-17 ENCOUNTER — Other Ambulatory Visit: Payer: Self-pay

## 2019-08-17 DIAGNOSIS — G4733 Obstructive sleep apnea (adult) (pediatric): Secondary | ICD-10-CM | POA: Diagnosis not present

## 2019-08-17 NOTE — Progress Notes (Signed)
95 percentile pressure 89   95th percentile leak 0   apnea index 0.6 /hr  apnea-hypopnea index  1.5 /hr   total days used  >4 hr 89 days  total days used <4 hr 0 days  Total compliance 99 percent  Doing great on cpap no problems or questions at this time

## 2019-08-26 ENCOUNTER — Ambulatory Visit: Payer: Medicare PPO | Admitting: Family Medicine

## 2019-08-30 DIAGNOSIS — G4733 Obstructive sleep apnea (adult) (pediatric): Secondary | ICD-10-CM | POA: Diagnosis not present

## 2019-09-01 ENCOUNTER — Telehealth: Payer: Self-pay

## 2019-09-01 NOTE — Telephone Encounter (Signed)
Confirmed appointment on 09/05/2019. klh

## 2019-09-05 ENCOUNTER — Encounter: Payer: Self-pay | Admitting: Internal Medicine

## 2019-09-05 ENCOUNTER — Ambulatory Visit: Payer: Medicare PPO | Admitting: Family Medicine

## 2019-09-05 ENCOUNTER — Other Ambulatory Visit: Payer: Self-pay

## 2019-09-05 ENCOUNTER — Ambulatory Visit: Payer: Medicare PPO | Admitting: Internal Medicine

## 2019-09-05 VITALS — BP 140/80 | HR 67 | Temp 97.6°F | Resp 16 | Ht 70.0 in | Wt 180.4 lb

## 2019-09-05 DIAGNOSIS — G4733 Obstructive sleep apnea (adult) (pediatric): Secondary | ICD-10-CM | POA: Diagnosis not present

## 2019-09-05 DIAGNOSIS — J449 Chronic obstructive pulmonary disease, unspecified: Secondary | ICD-10-CM | POA: Diagnosis not present

## 2019-09-05 DIAGNOSIS — Z9989 Dependence on other enabling machines and devices: Secondary | ICD-10-CM

## 2019-09-05 DIAGNOSIS — I10 Essential (primary) hypertension: Secondary | ICD-10-CM

## 2019-09-05 DIAGNOSIS — R0602 Shortness of breath: Secondary | ICD-10-CM

## 2019-09-05 MED ORDER — TRELEGY ELLIPTA 100-62.5-25 MCG/INH IN AEPB: 1.0000 | INHALATION_SPRAY | Freq: Every day | RESPIRATORY_TRACT | 2 refills | Status: DC

## 2019-09-05 NOTE — Progress Notes (Signed)
[facility] [place], [place] [address]  Pulmonary Sleep Medicine   Office Visit Note  Patient Name: Marcus Delacruz DOB: 07-11-1940 MRN [id number]  Date of Service: 09/05/2019  Complaints/HPI: Pt is here for pulmonary follow up on COPA and OSA.  Pt reports good compliance with CPAP therapy. Cleaning machine with SOClean, by hand, and changing filters and tubing as directed. Denies headaches, sinus issues, palpitations, or hemoptysis.  His download shows 99% compliance. He reports his copd is well controlled with Trelegy.  He reports he has excellent control of symptoms with this.  He has not needed his nebulizer or any emergency treatment.   ROS  General: (-) fever, (-) chills, (-) night sweats, (-) weakness Skin: (-) rashes, (-) itching,. Eyes: (-) visual changes, (-) redness, (-) itching. Nose and Sinuses: (-) nasal stuffiness or itchiness, (-) postnasal drip, (-) nosebleeds, (-) sinus trouble. Mouth and Throat: (-) sore throat, (-) hoarseness. Neck: (-) swollen glands, (-) enlarged thyroid, (-) neck pain. Respiratory: - cough, (-) bloody sputum, - shortness of breath, - wheezing. Cardiovascular: - ankle swelling, (-) chest pain. Lymphatic: (-) lymph node enlargement. Neurologic: (-) numbness, (-) tingling. Psychiatric: (-) anxiety, (-) depression   Current Medication: Outpatient Encounter Medications as of 09/05/2019  Medication Sig  . albuterol (VENTOLIN HFA) 108 (90 Base) MCG/ACT inhaler USE 2 PUFFS EVERY 6 HOURS AS NEEDED SHORTNESS OF BREATH OR WHEEZING  . ALLERGY RELIEF 180 MG tablet TAKE ONE (1) TABLET BY MOUTH ONCE DAILY  . aspirin 81 MG tablet Take 81 mg by mouth daily.  Marland Kitchen atorvastatin (LIPITOR) 40 MG tablet TAKE (1) TABLET BY MOUTH EVERY DAY  . finasteride (PROSCAR) 5 MG tablet TAKE (1) TABLET BY MOUTH EVERY DAY  . Fluticasone-Umeclidin-Vilant (TRELEGY ELLIPTA) 100-62.5-25 MCG/INH AEPB Inhale 1 puff into the lungs daily.  Marland Kitchen ibuprofen  (ADVIL) 800 MG tablet TAKE (1) TABLET BY MOUTH THREE TIMES A DAY  . lisinopril-hydrochlorothiazide (ZESTORETIC) 10-12.5 MG tablet TAKE (1) TABLET BY MOUTH EVERY DAY  . meloxicam (MOBIC) 7.5 MG tablet Take 7.5 mg by mouth as needed for pain.  . montelukast (SINGULAIR) 10 MG tablet TAKE ONE (1) TABLET BY MOUTH ONCE DAILY  . NON FORMULARY cpap device  . [DISCONTINUED] Fluticasone-Umeclidin-Vilant (TRELEGY ELLIPTA) 100-62.5-25 MCG/INH AEPB Inhale 1 puff into the lungs daily.  . [DISCONTINUED] ipratropium-albuterol (DUONEB) 0.5-2.5 (3) MG/3ML SOLN TAKE 3MLS BY NEBULIZATION EVERY 6 HOURS AS NEEDED (Patient not taking: Reported on 05/20/2019)   Facility-Administered Encounter Medications as of 09/05/2019  Medication  . ipratropium-albuterol (DUONEB) 0.5-2.5 (3) MG/3ML nebulizer solution 3 mL  . ipratropium-albuterol (DUONEB) 0.5-2.5 (3) MG/3ML nebulizer solution 3 mL    Surgical History: Past Surgical History:  Procedure Laterality Date  . ARTERY REPAIR    . BACK SURGERY    . CHOLECYSTECTOMY    . COLONOSCOPY  2013   cleared  . HERNIA REPAIR      Medical History: Past Medical History:  Diagnosis Date  . Allergic rhinitis due to pollen   . Asthma   . BPH (benign prostatic hyperplasia)   . Cardiomegaly   . Chronic obstructive pulmonary disease (COPD) ([place])   . Hypercholesteremia   . Hypersomnia, unspecified   . Hypertension   . Obstructive sleep apnea (adult) (pediatric)   . Shortness of breath   . Snoring   . Solitary pulmonary nodule     Family History: Family History  Problem Relation Age of Onset  . COPD Mother   . Asthma Son   .  Cancer Father        lung  . Prostate cancer Neg Hx   . Kidney disease Neg Hx     Social History: Social History   Socioeconomic History  . Marital status: Married    Spouse name: Not on file  . Number of children: 3  . Years of education: Not on file  . Highest education level: Associate degree: academic program  Occupational History   . Occupation: retired  Tobacco Use  . Smoking status: Former Smoker    Packs/day: 1.00    Years: 20.00    Pack years: 20.00    Types: Cigarettes    Quit date: 08/09/1983    Years since quitting: 36.0  . Smokeless tobacco: Never Used  . Tobacco comment: quit smoking over 40 years ago  Vaping Use  . Vaping Use: Never used  Substance and Sexual Activity  . Alcohol use: Yes    Alcohol/week: 0.0 standard drinks    Comment: once monthly  . Drug use: No  . Sexual activity: Not on file  Other Topics Concern  . Not on file  Social History Narrative  . Not on file   Social Determinants of Health   Financial Resource Strain:   . Difficulty of Paying Living Expenses:   Food Insecurity:   . Worried About Charity fundraiser in the Last Year:   . Arboriculturist in the Last Year:   Transportation Needs:   . Film/video editor (Medical):   Marland Kitchen Lack of Transportation (Non-Medical):   Physical Activity:   . Days of Exercise per Week:   . Minutes of Exercise per Session:   Stress: No Stress Concern Present  . Feeling of Stress : Not at all  Social Connections:   . Frequency of Communication with Friends and Family:   . Frequency of Social Gatherings with Friends and Family:   . Attends Religious Services:   . Active Member of Clubs or Organizations:   . Attends Archivist Meetings:   Marland Kitchen Marital Status:   Intimate Partner Violence:   . Fear of Current or Ex-Partner:   . Emotionally Abused:   Marland Kitchen Physically Abused:   . Sexually Abused:     Vital Signs: Blood pressure 140/80, pulse 67, temperature 97.6 F (36.4 C), resp. rate 16, height 5\' 10"  (1.778 m), weight 180 lb 6.4 oz (81.8 kg), SpO2 93 %.  Examination: General Appearance: The patient is well-developed, well-nourished, and in no distress. Skin: Gross inspection of skin unremarkable. Head: normocephalic, no gross deformities. Eyes: no gross deformities noted. ENT: ears appear grossly normal no exudates. Neck:  Supple. No thyromegaly. No LAD. Respiratory: clear bilaterally. Cardiovascular: Normal S1 and S2 without murmur or rub. Extremities: No cyanosis. pulses are equal. Neurologic: Alert and oriented. No involuntary movements.  LABS: No results found for this or any previous visit (from the past 2160 hour(s)).  Radiology: DG Shoulder Right  Result Date: 01/08/2018 CLINICAL DATA:  persistent right shoulder pain EXAM: RIGHT SHOULDER - 2+ VIEW COMPARISON:  None. FINDINGS: No acute fracture. No dislocation.  Unremarkable soft tissues. IMPRESSION: No acute bony pathology. Electronically Signed   By: Marybelle Killings M.D.   On: 01/08/2018 07:16    No results found.  No results found.    Assessment and Plan: Patient Active Problem List   Diagnosis Date Noted  . Chronic obstructive pulmonary disease with acute exacerbation ([place]) 07/27/2017  . Asthma, chronic, severe persistent, uncomplicated 09/60/4540  . Seasonal  allergic rhinitis due to pollen 08/18/2016  . Bilateral carotid artery stenosis 04/03/2016  . Bilateral hydrocele 11/22/2014  . BPH with obstruction/lower urinary tract symptoms 10/05/2014  . Epididymal cyst 10/05/2014  . Familial multiple lipoprotein-type hyperlipidemia 07/14/2014  . Laboratory animal allergy 07/14/2014  . Acute arthropathy 07/14/2014  . Routine general medical examination at a health care facility 07/14/2014  . Asthma with exacerbation 07/14/2014  . Essential (primary) hypertension 07/14/2014  . Screening for depression 07/14/2014  . Noncompliance w/medication treatment due to intermit use of medication 11/23/2013  . Asthma, chronic 08/08/2013    1. OSA on CPAP Continue to use cpap nightly.  Good relief of symptoms.   2. Chronic obstructive pulmonary disease, unspecified COPD type ([place]) Continue with Trelegy as prescribed.  Good relief of symptoms.  Excellent control.   3. Essential hypertension Continue to follow with PCP.   4. SOB (shortness of  breath) FEV1 is 1.9 which is 63% of pre-predicted value.  - Spirometry with Graph  General Counseling: I have discussed the findings of the evaluation and examination with Marcus Delacruz.  I have also discussed any further diagnostic evaluation thatmay be needed or ordered today. Marcus Delacruz verbalizes understanding of the findings of todays visit. We also reviewed his medications today and discussed drug interactions and side effects including but not limited excessive drowsiness and altered mental states. We also discussed that there is always a risk not just to him but also people around him. he has been encouraged to call the office with any questions or concerns that should arise related to todays visit.  Orders Placed This Encounter  Procedures  . Spirometry with Graph    Order Specific Question:   Where should this test be performed?    Answer:   [facility]     Time spent: 30 This patient was seen by Orson Gear AGNP-C in Collaboration with Dr. Devona Konig as a part of collaborative care agreement.   I have personally obtained a history, examined the patient, evaluated laboratory and imaging results, formulated the assessment and plan and placed orders.    Allyne Gee, MD [facility] Pulmonary and Critical Care Sleep medicine

## 2019-09-06 ENCOUNTER — Encounter: Payer: Self-pay | Admitting: Family Medicine

## 2019-09-06 ENCOUNTER — Ambulatory Visit: Payer: Medicare PPO | Admitting: Family Medicine

## 2019-09-06 VITALS — BP 130/70 | HR 72 | Ht 70.0 in | Wt 180.0 lb

## 2019-09-06 DIAGNOSIS — J301 Allergic rhinitis due to pollen: Secondary | ICD-10-CM

## 2019-09-06 DIAGNOSIS — H6121 Impacted cerumen, right ear: Secondary | ICD-10-CM | POA: Diagnosis not present

## 2019-09-06 DIAGNOSIS — I10 Essential (primary) hypertension: Secondary | ICD-10-CM

## 2019-09-06 DIAGNOSIS — E7849 Other hyperlipidemia: Secondary | ICD-10-CM

## 2019-09-06 DIAGNOSIS — J4550 Severe persistent asthma, uncomplicated: Secondary | ICD-10-CM

## 2019-09-06 DIAGNOSIS — H9191 Unspecified hearing loss, right ear: Secondary | ICD-10-CM | POA: Diagnosis not present

## 2019-09-06 MED ORDER — LISINOPRIL-HYDROCHLOROTHIAZIDE 10-12.5 MG PO TABS: ORAL_TABLET | ORAL | 1 refills | Status: DC

## 2019-09-06 MED ORDER — MONTELUKAST SODIUM 10 MG PO TABS: ORAL_TABLET | ORAL | 1 refills | Status: DC

## 2019-09-06 MED ORDER — CARBAMIDE PEROXIDE 6.5 % OT SOLN: 5.0000 [drp] | Freq: Two times a day (BID) | OTIC | 0 refills | Status: DC

## 2019-09-06 NOTE — Progress Notes (Signed)
Date:  09/06/2019   Name:  Marcus Delacruz   DOB:  23-Sep-1940   MRN:  [id number]   Chief Complaint: Asthma, Hyperlipidemia, Hypertension, Shoulder Pain (refill meloxicam), and Hearing Loss  Asthma There is no chest tightness, cough, difficulty breathing, frequent throat clearing, hemoptysis, hoarse voice, shortness of breath, sputum production or wheezing. This is a chronic problem. The current episode started more than 1 year ago. The problem occurs daily. The problem has been waxing and waning. Pertinent negatives include no appetite change, chest pain, dyspnea on exertion, ear congestion, ear pain, fever, headaches, heartburn, malaise/fatigue, myalgias, nasal congestion, orthopnea, PND, postnasal drip, rhinorrhea, sneezing, sore throat, sweats, trouble swallowing or weight loss. His symptoms are aggravated by nothing. His symptoms are alleviated by beta-agonist (singulair). He reports moderate improvement on treatment. His past medical history is significant for asthma.  Hyperlipidemia This is a chronic problem. The current episode started more than 1 year ago. The problem is controlled. Recent lipid tests were reviewed and are normal. He has no history of chronic renal disease, diabetes, hypothyroidism, liver disease or obesity. There are no known factors aggravating his hyperlipidemia. Pertinent negatives include no chest pain, focal sensory loss, focal weakness, leg pain, myalgias or shortness of breath. Current antihyperlipidemic treatment includes statins. The current treatment provides moderate improvement of lipids. There are no compliance problems.  Risk factors for coronary artery disease include dyslipidemia, hypertension and male sex.  Hypertension This is a chronic problem. The current episode started more than 1 year ago. The problem has been waxing and waning since onset. The problem is controlled. Pertinent negatives include no anxiety, blurred vision, chest pain, headaches,  malaise/fatigue, neck pain, orthopnea, palpitations, peripheral edema, PND, shortness of breath or sweats. Past treatments include ACE inhibitors and diuretics. The current treatment provides mild improvement. There are no compliance problems.  There is no history of angina, kidney disease, CAD/MI, CVA, heart failure, left ventricular hypertrophy, PVD or retinopathy. There is no history of chronic renal disease, a hypertension causing med or renovascular disease.  Shoulder Pain  This is a chronic problem. The current episode started more than 1 year ago. The problem has been gradually improving. Pertinent negatives include no fever. The treatment provided moderate relief. There is no history of diabetes.    Lab Results  Component Value Date   CREATININE 1.05 02/23/2019   BUN 19 02/23/2019   NA 144 02/23/2019   K 4.8 02/23/2019   CL 106 02/23/2019   CO2 24 02/23/2019   Lab Results  Component Value Date   CHOL 163 08/23/2018   HDL 60 08/23/2018   LDLCALC 90 08/23/2018   TRIG 67 08/23/2018   CHOLHDL 3.5 08/20/2017   No results found for: TSH No results found for: HGBA1C Lab Results  Component Value Date   WBC 8.2 08/08/2013   HGB 15.7 07/10/2014   HCT 43.3 08/08/2013   MCV 86.7 08/08/2013   PLT 354.0 08/08/2013   Lab Results  Component Value Date   ALT 24 02/23/2019   AST 60 (H) 02/23/2019   ALKPHOS 65 02/23/2019   BILITOT 0.3 02/23/2019     Review of Systems  Constitutional: Negative for appetite change, chills, fever, malaise/fatigue and weight loss.  HENT: Negative for drooling, ear discharge, ear pain, hoarse voice, postnasal drip, rhinorrhea, sneezing, sore throat and trouble swallowing.   Eyes: Negative for blurred vision.  Respiratory: Negative for cough, hemoptysis, sputum production, shortness of breath and wheezing.   Cardiovascular: Negative for chest  pain, dyspnea on exertion, palpitations, orthopnea, leg swelling and PND.  Gastrointestinal: Negative for  abdominal pain, blood in stool, constipation, diarrhea, heartburn and nausea.  Endocrine: Negative for polydipsia.  Genitourinary: Negative for dysuria, frequency, hematuria and urgency.  Musculoskeletal: Negative for back pain, myalgias and neck pain.  Skin: Negative for rash.  Allergic/Immunologic: Negative for environmental allergies.  Neurological: Negative for dizziness, focal weakness and headaches.  Hematological: Does not bruise/bleed easily.  Psychiatric/Behavioral: Negative for suicidal ideas. The patient is not nervous/anxious.     Patient Active Problem List   Diagnosis Date Noted  . Chronic obstructive pulmonary disease with acute exacerbation ([place]) 07/27/2017  . Asthma, chronic, severe persistent, uncomplicated 81/19/1478  . Seasonal allergic rhinitis due to pollen 08/18/2016  . Bilateral carotid artery stenosis 04/03/2016  . Bilateral hydrocele 11/22/2014  . BPH with obstruction/lower urinary tract symptoms 10/05/2014  . Epididymal cyst 10/05/2014  . Familial multiple lipoprotein-type hyperlipidemia 07/14/2014  . Laboratory animal allergy 07/14/2014  . Acute arthropathy 07/14/2014  . Routine general medical examination at a health care facility 07/14/2014  . Asthma with exacerbation 07/14/2014  . Essential (primary) hypertension 07/14/2014  . Screening for depression 07/14/2014  . Noncompliance w/medication treatment due to intermit use of medication 11/23/2013  . Asthma, chronic 08/08/2013    Allergies  Allergen Reactions  . Codeine Itching    Past Surgical History:  Procedure Laterality Date  . ARTERY REPAIR    . BACK SURGERY    . CHOLECYSTECTOMY    . COLONOSCOPY  2013   cleared  . HERNIA REPAIR      Social History   Tobacco Use  . Smoking status: Former Smoker    Packs/day: 1.00    Years: 20.00    Pack years: 20.00    Types: Cigarettes    Quit date: 08/09/1983    Years since quitting: 36.1  . Smokeless tobacco: Never Used  . Tobacco comment:  quit smoking over 40 years ago  Vaping Use  . Vaping Use: Never used  Substance Use Topics  . Alcohol use: Yes    Alcohol/week: 0.0 standard drinks    Comment: once monthly  . Drug use: No     Medication list has been reviewed and updated.  Current Meds  Medication Sig  . albuterol (VENTOLIN HFA) 108 (90 Base) MCG/ACT inhaler USE 2 PUFFS EVERY 6 HOURS AS NEEDED SHORTNESS OF BREATH OR WHEEZING  . ALLERGY RELIEF 180 MG tablet TAKE ONE (1) TABLET BY MOUTH ONCE DAILY  . aspirin 81 MG tablet Take 81 mg by mouth daily.  Marland Kitchen atorvastatin (LIPITOR) 40 MG tablet TAKE (1) TABLET BY MOUTH EVERY DAY  . finasteride (PROSCAR) 5 MG tablet TAKE (1) TABLET BY MOUTH EVERY DAY  . Fluticasone-Umeclidin-Vilant (TRELEGY ELLIPTA) 100-62.5-25 MCG/INH AEPB Inhale 1 puff into the lungs daily.  Marland Kitchen ibuprofen (ADVIL) 800 MG tablet TAKE (1) TABLET BY MOUTH THREE TIMES A DAY  . lisinopril-hydrochlorothiazide (ZESTORETIC) 10-12.5 MG tablet TAKE (1) TABLET BY MOUTH EVERY DAY  . meloxicam (MOBIC) 15 MG tablet Take 1 tablet by mouth every other day.  . montelukast (SINGULAIR) 10 MG tablet TAKE ONE (1) TABLET BY MOUTH ONCE DAILY  . NON FORMULARY cpap device   Current Facility-Administered Medications for the 09/06/19 encounter (Office Visit) with Juline Patch, MD  Medication  . ipratropium-albuterol (DUONEB) 0.5-2.5 (3) MG/3ML nebulizer solution 3 mL  . ipratropium-albuterol (DUONEB) 0.5-2.5 (3) MG/3ML nebulizer solution 3 mL    PHQ 2/9 Scores 09/06/2019 02/23/2019 02/16/2019 02/19/2018  PHQ -  2 Score 0 0 0 0  PHQ- 9 Score 0 0 - 0    GAD 7 : Generalized Anxiety Score 09/06/2019 02/23/2019  Nervous, Anxious, on Edge 0 0  Control/stop worrying 0 0  Worry too much - different things 0 0  Trouble relaxing 0 0  Restless 0 0  Easily annoyed or irritable 0 0  Afraid - awful might happen 0 0  Total GAD 7 Score 0 0    BP Readings from Last 3 Encounters:  09/06/19 130/70  09/05/19 140/80  05/20/19 131/68     Physical Exam Vitals and nursing note reviewed.  HENT:     Head: Normocephalic.     Right Ear: External ear normal. There is impacted cerumen.     Left Ear: Ear canal and external ear normal.     Nose: Nose normal. No congestion or rhinorrhea.     Mouth/Throat:     Mouth: Mucous membranes are moist.  Eyes:     General: No scleral icterus.       Right eye: No discharge.        Left eye: No discharge.     Conjunctiva/sclera: Conjunctivae normal.     Pupils: Pupils are equal, round, and reactive to light.  Neck:     Thyroid: No thyromegaly.     Vascular: No JVD.     Trachea: No tracheal deviation.  Cardiovascular:     Rate and Rhythm: Normal rate and regular rhythm.     Heart sounds: Normal heart sounds. No murmur heard.  No friction rub. No gallop.   Pulmonary:     Effort: No respiratory distress.     Breath sounds: Normal breath sounds. No wheezing or rales.  Abdominal:     General: Bowel sounds are normal.     Palpations: Abdomen is soft. There is no mass.     Tenderness: There is no abdominal tenderness. There is no guarding or rebound.  Musculoskeletal:        General: No tenderness. Normal range of motion.     Cervical back: Normal range of motion and neck supple.  Lymphadenopathy:     Cervical: No cervical adenopathy.  Skin:    General: Skin is warm.     Findings: No rash.  Neurological:     Mental Status: He is alert and oriented to person, place, and time.     Cranial Nerves: No cranial nerve deficit.     Deep Tendon Reflexes: Reflexes are normal and symmetric.     Wt Readings from Last 3 Encounters:  09/06/19 180 lb (81.6 kg)  09/05/19 180 lb 6.4 oz (81.8 kg)  05/20/19 185 lb (83.9 kg)    BP 130/70   Pulse 72   Ht 5\' 10"  (1.778 m)   Wt 180 lb (81.6 kg)   BMI 25.83 kg/m   Assessment and Plan: 1. Familial multiple lipoprotein-type hyperlipidemia Chronic.  Controlled.  Stable.  This is controlled with diet.  We will check lipid panel. - Lipid  Panel With LDL/HDL Ratio  2. Essential (primary) hypertension Chronic.  Controlled.  Stable.  Continue lisinopril hydrochlorothiazide 10-12.5 mg once a day. - lisinopril-hydrochlorothiazide (ZESTORETIC) 10-12.5 MG tablet; TAKE (1) TABLET BY MOUTH EVERY DAY  Dispense: 90 tablet; Refill: 1 - Comprehensive Metabolic Panel (CMET)  3. Seasonal allergic rhinitis due to pollen Chronic.  Controlled.  Stable.  Continue Singulair 10 mg once a day. - montelukast (SINGULAIR) 10 MG tablet; TAKE ONE (1) TABLET BY MOUTH ONCE DAILY  Dispense: 90 tablet; Refill: 1  4. Asthma, chronic, severe persistent, uncomplicated Chronic.  Controlled.  Stable.  Patient even though is not mowing his double-digit number of lawns is having some issues but is controlled with current inhalers and Singulair. - montelukast (SINGULAIR) 10 MG tablet; TAKE ONE (1) TABLET BY MOUTH ONCE DAILY  Dispense: 90 tablet; Refill: 1  5. Impacted cerumen of right ear Patient's had decreased hearing particularly in the left side.  We will continue with further evaluation of ear nose and throat patient has been given Debrox to be used prior to evaluation. - carbamide peroxide (DEBROX) 6.5 % OTIC solution; Place 5 drops into the right ear 2 (two) times daily.  Dispense: 15 mL; Refill: 0 - Ambulatory referral to ENT  6. Hearing loss of right ear, unspecified hearing loss type Patient has longstanding hearing loss which she has had previously evaluated and was told there was nothing they could do.  We will have him evaluated by ear nose and throat for decreased hearing in the meantime he will work on trying to dislodge the cerumen but without use of Q-tips that he has been using. - Ambulatory referral to ENT

## 2019-09-07 LAB — COMPREHENSIVE METABOLIC PANEL
ALT: 24 IU/L (ref 0–44)
AST: 47 IU/L — ABNORMAL HIGH (ref 0–40)
Albumin/Globulin Ratio: 1.6 (ref 1.2–2.2)
Albumin: 4.5 g/dL (ref 3.7–4.7)
Alkaline Phosphatase: 68 IU/L (ref 48–121)
BUN/Creatinine Ratio: 14 (ref 10–24)
BUN: 14 mg/dL (ref 8–27)
Bilirubin Total: 0.6 mg/dL (ref 0.0–1.2)
CO2: 25 mmol/L (ref 20–29)
Calcium: 9.5 mg/dL (ref 8.6–10.2)
Chloride: 101 mmol/L (ref 96–106)
Creatinine, Ser: 1.03 mg/dL (ref 0.76–1.27)
GFR calc Af Amer: 81 mL/min/{1.73_m2} (ref >59)
GFR calc non Af Amer: 70 mL/min/{1.73_m2} (ref >59)
Globulin, Total: 2.9 g/dL (ref 1.5–4.5)
Glucose: 91 mg/dL (ref 65–99)
Potassium: 4.5 mmol/L (ref 3.5–5.2)
Sodium: 140 mmol/L (ref 134–144)
Total Protein: 7.4 g/dL (ref 6.0–8.5)

## 2019-09-07 LAB — LIPID PANEL WITH LDL/HDL RATIO
Cholesterol, Total: 175 mg/dL (ref 100–199)
HDL: 61 mg/dL (ref >39)
LDL Chol Calc (NIH): 96 mg/dL (ref 0–99)
LDL/HDL Ratio: 1.6 ratio (ref 0.0–3.6)
Triglycerides: 100 mg/dL (ref 0–149)
VLDL Cholesterol Cal: 18 mg/dL (ref 5–40)

## 2019-09-20 DIAGNOSIS — R490 Dysphonia: Secondary | ICD-10-CM | POA: Diagnosis not present

## 2019-09-20 DIAGNOSIS — H6123 Impacted cerumen, bilateral: Secondary | ICD-10-CM | POA: Diagnosis not present

## 2019-09-20 DIAGNOSIS — H903 Sensorineural hearing loss, bilateral: Secondary | ICD-10-CM | POA: Diagnosis not present

## 2019-09-20 DIAGNOSIS — H6060 Unspecified chronic otitis externa, unspecified ear: Secondary | ICD-10-CM | POA: Diagnosis not present

## 2019-10-12 DIAGNOSIS — Z872 Personal history of diseases of the skin and subcutaneous tissue: Secondary | ICD-10-CM | POA: Diagnosis not present

## 2019-10-12 DIAGNOSIS — L578 Other skin changes due to chronic exposure to nonionizing radiation: Secondary | ICD-10-CM | POA: Diagnosis not present

## 2019-10-12 DIAGNOSIS — L570 Actinic keratosis: Secondary | ICD-10-CM | POA: Diagnosis not present

## 2019-10-12 DIAGNOSIS — L218 Other seborrheic dermatitis: Secondary | ICD-10-CM | POA: Diagnosis not present

## 2019-10-12 DIAGNOSIS — Z85828 Personal history of other malignant neoplasm of skin: Secondary | ICD-10-CM | POA: Diagnosis not present

## 2019-11-07 ENCOUNTER — Other Ambulatory Visit: Payer: Self-pay | Admitting: Family Medicine

## 2019-11-07 DIAGNOSIS — J4550 Severe persistent asthma, uncomplicated: Secondary | ICD-10-CM

## 2019-11-07 NOTE — Telephone Encounter (Signed)
Requested Prescriptions  Pending Prescriptions Disp Refills  . albuterol (VENTOLIN HFA) 108 (90 Base) MCG/ACT inhaler [Pharmacy Med Name: ALBUTEROL SULFATE HFA 108 (90 BASE)] 18 g 1    Sig: USE 2 PUFFS EVERY 6 HOURS AS NEEDED SHORTNESS OF BREATH OR WHEEZING     Pulmonology:  Beta Agonists Failed - 11/07/2019 12:28 PM      Failed - One inhaler should last at least one month. If the patient is requesting refills earlier, contact the patient to check for uncontrolled symptoms.      Passed - Valid encounter within last 12 months    Recent Outpatient Visits          2 months ago Essential (primary) hypertension   [facility] Juline Patch, MD   8 months ago Elevated AST (SGOT)   [facility] Juline Patch, MD   1 year ago Encounter for annual physical exam   [place], Deanna C, MD   1 year ago Essential (primary) hypertension   [facility] Juline Patch, MD   1 year ago Acute pain of right shoulder   [facility] Juline Patch, MD      Future Appointments            In 3 months Juline Patch, MD [facility] [place]   In 3 months [place], G[facility] [place]

## 2019-11-30 DIAGNOSIS — G4733 Obstructive sleep apnea (adult) (pediatric): Secondary | ICD-10-CM | POA: Diagnosis not present

## 2019-12-08 ENCOUNTER — Other Ambulatory Visit: Payer: Self-pay | Admitting: Family Medicine

## 2019-12-08 DIAGNOSIS — J301 Allergic rhinitis due to pollen: Secondary | ICD-10-CM

## 2020-01-24 ENCOUNTER — Telehealth: Payer: Self-pay

## 2020-01-24 DIAGNOSIS — L218 Other seborrheic dermatitis: Secondary | ICD-10-CM | POA: Diagnosis not present

## 2020-01-24 DIAGNOSIS — L570 Actinic keratosis: Secondary | ICD-10-CM | POA: Diagnosis not present

## 2020-01-24 NOTE — Telephone Encounter (Unsigned)
Copied from [facility] [id number]. Topic: General - Other >> Jan 24, 2020 10:00 AM Celene Kras wrote: Reason for CRM: Pt called and is requesting a call from [place] [employer] regarding his medicare plans. Please advise.

## 2020-01-27 DIAGNOSIS — H2513 Age-related nuclear cataract, bilateral: Secondary | ICD-10-CM | POA: Diagnosis not present

## 2020-02-19 NOTE — Progress Notes (Signed)
02/20/2020 10:13 AM   Marcus Delacruz 31-May-1940 [id number]  Referring provider: Juline Patch, MD [address] [place],  [place] [address]  Chief Complaint  Patient presents with  . Follow-up    1 year follow-up with IPSS/PVR    HPI: Patient is a 79 year old male with BPH with LUTS who presents today for a yearly follow up.  BPH WITH LUTS His IPSS score today is 0, which is no lower urinary tract symptomatology. He is delighted with his quality life due to his urinary symptoms.  His previous  I PSS score was 2/0.  Previous PVR: 33 mL.  He has no complaints today.   Patient denies any modifying or aggravating factors.  Patient denies any gross hematuria, dysuria or suprapubic/flank pain.  Patient denies any fevers, chills, nausea or vomiting. He did not stop the finasteride.    IPSS    Row Name 02/20/20 1000         International Prostate Symptom Score   How often have you had the sensation of not emptying your bladder? Not at All     How often have you had to urinate less than every two hours? Not at All     How often have you found you stopped and started again several times when you urinated? Not at All     How often have you found it difficult to postpone urination? Not at All     How often have you had a weak urinary stream? Not at All     How often have you had to strain to start urination? Not at All     How many times did you typically get up at night to urinate? None     Total IPSS Score 0           Quality of Life due to urinary symptoms   If you were to spend the rest of your life with your urinary condition just the way it is now how would you feel about that? Delighted            Score:  1-7 Mild 8-19 Moderate 20-35 Severe   PMH: Past Medical History:  Diagnosis Date  . Allergic rhinitis due to pollen   . Asthma   . BPH (benign prostatic hyperplasia)   . Cardiomegaly   . Chronic obstructive pulmonary disease (COPD) ([place])   .  Hypercholesteremia   . Hypersomnia, unspecified   . Hypertension   . Obstructive sleep apnea (adult) (pediatric)   . Shortness of breath   . Snoring   . Solitary pulmonary nodule     Surgical History: Past Surgical History:  Procedure Laterality Date  . ARTERY REPAIR    . BACK SURGERY    . CHOLECYSTECTOMY    . COLONOSCOPY  2013   cleared  . HERNIA REPAIR      Home Medications:  Allergies as of 02/20/2020      Reactions   Codeine Itching      Medication List       Accurate as of February 20, 2020 10:13 AM. If you have any questions, ask your nurse or doctor.        STOP taking these medications   meloxicam 15 MG tablet Commonly known as: MOBIC Stopped by: Aldred Mase, PA-C     TAKE these medications   albuterol 108 (90 Base) MCG/ACT inhaler Commonly known as: VENTOLIN HFA USE 2 PUFFS EVERY 6 HOURS AS NEEDED SHORTNESS  OF BREATH OR WHEEZING   Allergy Relief 180 MG tablet Generic drug: fexofenadine TAKE (1) TABLET BY MOUTH EVERY DAY   aspirin 81 MG tablet Take 81 mg by mouth daily.   atorvastatin 40 MG tablet Commonly known as: LIPITOR TAKE (1) TABLET BY MOUTH EVERY DAY   carbamide peroxide 6.5 % OTIC solution Commonly known as: DEBROX Place 5 drops into the right ear 2 (two) times daily.   finasteride 5 MG tablet Commonly known as: PROSCAR TAKE (1) TABLET BY MOUTH EVERY DAY   ibuprofen 800 MG tablet Commonly known as: ADVIL TAKE (1) TABLET BY MOUTH THREE TIMES A DAY   lisinopril-hydrochlorothiazide 10-12.5 MG tablet Commonly known as: ZESTORETIC TAKE (1) TABLET BY MOUTH EVERY DAY   montelukast 10 MG tablet Commonly known as: SINGULAIR TAKE ONE (1) TABLET BY MOUTH ONCE DAILY   NON FORMULARY cpap device   Trelegy Ellipta 100-62.5-25 MCG/INH Aepb Generic drug: Fluticasone-Umeclidin-Vilant Inhale 1 puff into the lungs daily.       Allergies:  Allergies  Allergen Reactions  . Codeine Itching    Family History: Family History   Problem Relation Age of Onset  . COPD Mother   . Asthma Son   . Cancer Father        lung  . Prostate cancer Neg Hx   . Kidney disease Neg Hx     Social History:  reports that he quit smoking about 36 years ago. His smoking use included cigarettes. He has a 20.00 pack-year smoking history. He has never used smokeless tobacco. He reports current alcohol use. He reports that he does not use drugs.  ROS: For pertinent review of systems please refer to history of present illness  Physical Exam: BP (!) 163/68   Pulse 71   Ht 5\' 10"  (1.778 m)   Wt 181 lb (82.1 kg)   BMI 25.97 kg/m   Constitutional:  Well nourished. Alert and oriented, No acute distress. HEENT: [place] AT, mask in place.  Trachea midline Cardiovascular: No clubbing, cyanosis, or edema. Respiratory: Normal respiratory effort, no increased work of breathing. Neurologic: Grossly intact, no focal deficits, moving all 4 extremities. Psychiatric: Normal mood and affect.  Laboratory Data: Lab Results  Component Value Date   CREATININE 1.03 09/06/2019   Component     Latest Ref Rng & Units 11/20/2014 10/31/2015 08/20/2017  Prostate Specific Ag, Serum     0.0 - 4.0 ng/mL 0.5 0.5 0.8   I have reviewed the labs.  Assessment & Plan:    1. BPH with LUTS  - IPSS score is 0/0, it is improved  - Continue conservative management, avoiding bladder irritants and timed voiding's  - He will continue finasteride 5 mg daily  - RTC in 12 months for IPSS and exam   Return in about 1 year (around 02/19/2021) for I PSS and office visit .  Zara Council, PA-C  [facility] [address] [place], [place] [address] [phone]

## 2020-02-20 ENCOUNTER — Ambulatory Visit: Payer: Medicare PPO | Admitting: Urology

## 2020-02-20 ENCOUNTER — Ambulatory Visit: Payer: Medicare PPO | Admitting: Family Medicine

## 2020-02-20 ENCOUNTER — Ambulatory Visit (INDEPENDENT_AMBULATORY_CARE_PROVIDER_SITE_OTHER): Payer: Medicare PPO

## 2020-02-20 ENCOUNTER — Encounter: Payer: Self-pay | Admitting: Family Medicine

## 2020-02-20 ENCOUNTER — Encounter: Payer: Self-pay | Admitting: Urology

## 2020-02-20 ENCOUNTER — Other Ambulatory Visit: Payer: Self-pay

## 2020-02-20 VITALS — BP 110/60 | HR 60 | Ht 70.0 in | Wt 179.0 lb

## 2020-02-20 VITALS — BP 163/68 | HR 71 | Ht 70.0 in | Wt 181.0 lb

## 2020-02-20 VITALS — BP 126/58 | HR 59 | Temp 97.9°F | Resp 16 | Ht 70.0 in | Wt 179.2 lb

## 2020-02-20 DIAGNOSIS — G8929 Other chronic pain: Secondary | ICD-10-CM

## 2020-02-20 DIAGNOSIS — N401 Enlarged prostate with lower urinary tract symptoms: Secondary | ICD-10-CM | POA: Diagnosis not present

## 2020-02-20 DIAGNOSIS — M25512 Pain in left shoulder: Secondary | ICD-10-CM

## 2020-02-20 DIAGNOSIS — E7849 Other hyperlipidemia: Secondary | ICD-10-CM

## 2020-02-20 DIAGNOSIS — N138 Other obstructive and reflux uropathy: Secondary | ICD-10-CM

## 2020-02-20 DIAGNOSIS — Z0000 Encounter for general adult medical examination without abnormal findings: Secondary | ICD-10-CM | POA: Diagnosis not present

## 2020-02-20 DIAGNOSIS — I10 Essential (primary) hypertension: Secondary | ICD-10-CM

## 2020-02-20 LAB — BLADDER SCAN AMB NON-IMAGING: Scan Result: 0

## 2020-02-20 MED ORDER — LISINOPRIL-HYDROCHLOROTHIAZIDE 10-12.5 MG PO TABS: ORAL_TABLET | ORAL | 1 refills | Status: DC

## 2020-02-20 MED ORDER — IBUPROFEN 800 MG PO TABS: ORAL_TABLET | ORAL | 1 refills | Status: DC

## 2020-02-20 MED ORDER — ATORVASTATIN CALCIUM 40 MG PO TABS: ORAL_TABLET | ORAL | 1 refills | Status: DC

## 2020-02-20 NOTE — Progress Notes (Signed)
Subjective:   Marcus Delacruz is a 79 y.o. male who presents for Medicare Annual/Subsequent preventive examination.  Review of Systems     Cardiac Risk Factors include: advanced age (>53men, >74 women);male gender;hypertension;dyslipidemia     Objective:    Today's Vitals   02/20/20 1331  BP: (!) 126/58  Pulse: (!) 59  Resp: 16  Temp: 97.9 F (36.6 C)  TempSrc: Oral  SpO2: 97%  Weight: 179 lb 3.2 oz (81.3 kg)  Height: 5\' 10"  (1.778 m)   Body mass index is 25.71 kg/m.  Advanced Directives 02/20/2020 02/16/2019 02/08/2018 04/03/2016 01/17/2015  Does Patient Have a Medical Advance Directive? Yes Yes Yes Yes Yes  Type of Paramedic of [place];Living will [place];Living will [place];Living will [place];Living will Living will;Healthcare Power of [place] in Chart? No - copy requested No - copy requested No - copy requested - No - copy requested    Current Medications (verified) Outpatient Encounter Medications as of 02/20/2020  Medication Sig  . albuterol (VENTOLIN HFA) 108 (90 Base) MCG/ACT inhaler USE 2 PUFFS EVERY 6 HOURS AS NEEDED SHORTNESS OF BREATH OR WHEEZING  . ALLERGY RELIEF 180 MG tablet TAKE (1) TABLET BY MOUTH EVERY DAY  . aspirin 81 MG tablet Take 81 mg by mouth daily.  Marland Kitchen atorvastatin (LIPITOR) 40 MG tablet TAKE (1) TABLET BY MOUTH EVERY DAY  . carbamide peroxide (DEBROX) 6.5 % OTIC solution Place 5 drops into the right ear 2 (two) times daily.  . finasteride (PROSCAR) 5 MG tablet TAKE (1) TABLET BY MOUTH EVERY DAY  . Fluticasone-Umeclidin-Vilant (TRELEGY ELLIPTA) 100-62.5-25 MCG/INH AEPB Inhale 1 puff into the lungs daily.  Marland Kitchen ibuprofen (ADVIL) 800 MG tablet TAKE (1) TABLET BY MOUTH THREE TIMES A DAY  . KETOCONAZOLE-HYDROCORTISONE EX Apply topically.  Marland Kitchen lisinopril-hydrochlorothiazide (ZESTORETIC) 10-12.5 MG tablet TAKE (1) TABLET BY  MOUTH EVERY DAY  . montelukast (SINGULAIR) 10 MG tablet TAKE ONE (1) TABLET BY MOUTH ONCE DAILY  . NON FORMULARY cpap device  . [DISCONTINUED] meloxicam (MOBIC) 15 MG tablet Take 1 tablet by mouth every other day.   Facility-Administered Encounter Medications as of 02/20/2020  Medication  . ipratropium-albuterol (DUONEB) 0.5-2.5 (3) MG/3ML nebulizer solution 3 mL  . ipratropium-albuterol (DUONEB) 0.5-2.5 (3) MG/3ML nebulizer solution 3 mL    Allergies (verified) Codeine   History: Past Medical History:  Diagnosis Date  . Allergic rhinitis due to pollen   . Asthma   . BPH (benign prostatic hyperplasia)   . Cardiomegaly   . Chronic obstructive pulmonary disease (COPD) ([place])   . Hypercholesteremia   . Hypersomnia, unspecified   . Hypertension   . Obstructive sleep apnea (adult) (pediatric)   . Shortness of breath   . Snoring   . Solitary pulmonary nodule    Past Surgical History:  Procedure Laterality Date  . ARTERY REPAIR    . BACK SURGERY    . CHOLECYSTECTOMY    . COLONOSCOPY  2013   cleared  . HERNIA REPAIR     Family History  Problem Relation Age of Onset  . COPD Mother   . Asthma Son   . Cancer Father        lung  . Prostate cancer Neg Hx   . Kidney disease Neg Hx    Social History   Socioeconomic History  . Marital status: Married    Spouse name: Not on file  . Number  of children: 3  . Years of education: Not on file  . Highest education level: Associate degree: academic program  Occupational History  . Occupation: retired  Tobacco Use  . Smoking status: Former Smoker    Packs/day: 1.00    Years: 20.00    Pack years: 20.00    Types: Cigarettes    Quit date: 08/09/1983    Years since quitting: 36.5  . Smokeless tobacco: Never Used  . Tobacco comment: quit smoking over 40 years ago  Vaping Use  . Vaping Use: Never used  Substance and Sexual Activity  . Alcohol use: Yes    Alcohol/week: 0.0 standard drinks    Comment: once monthly  . Drug use:  No  . Sexual activity: Not on file  Other Topics Concern  . Not on file  Social History Narrative  . Not on file   Social Determinants of Health   Financial Resource Strain: Low Risk   . Difficulty of Paying Living Expenses: Not hard at all  Food Insecurity: No Food Insecurity  . Worried About Charity fundraiser in the Last Year: Never true  . Ran Out of Food in the Last Year: Never true  Transportation Needs: No Transportation Needs  . Lack of Transportation (Medical): No  . Lack of Transportation (Non-Medical): No  Physical Activity: Inactive  . Days of Exercise per Week: 0 days  . Minutes of Exercise per Session: 0 min  Stress: No Stress Concern Present  . Feeling of Stress : Not at all  Social Connections: Moderately Integrated  . Frequency of Communication with Friends and Family: More than three times a week  . Frequency of Social Gatherings with Friends and Family: Not on file  . Attends Religious Services: More than 4 times per year  . Active Member of Clubs or Organizations: No  . Attends Archivist Meetings: Never  . Marital Status: Married    Tobacco Counseling Counseling given: Not Answered Comment: quit smoking over 40 years ago   Clinical Intake:  Pre-visit preparation completed: Yes  Pain : No/denies pain     BMI - recorded: 25.71 Nutritional Status: BMI 25 -29 Overweight Nutritional Risks: None Diabetes: No  How often do you need to have someone help you when you read instructions, pamphlets, or other written materials from your doctor or pharmacy?: 1 - Never    Interpreter Needed?: No  Information entered by :: Clemetine Marker LPN   Activities of Daily Living In your present state of health, do you have any difficulty performing the following activities: 02/20/2020  Hearing? Y  Comment interested in hearing aids  Vision? N  Difficulty concentrating or making decisions? N  Walking or climbing stairs? N  Dressing or bathing? N   Doing errands, shopping? N  Preparing Food and eating ? N  Using the Toilet? N  In the past six months, have you accidently leaked urine? N  Do you have problems with loss of bowel control? N  Managing your Medications? N  Managing your Finances? N  Housekeeping or managing your Housekeeping? N  Some recent data might be hidden    Patient Care Team: Juline Patch, MD as PCP - General (Family Medicine)  Indicate any recent Medical Services you may have received from other than [facility] providers in the past year (date may be approximate).     Assessment:   This is a routine wellness examination for [place].  Hearing/Vision screen  Hearing Screening   125Hz   250Hz  500Hz  1000Hz  2000Hz  3000Hz  4000Hz  6000Hz  8000Hz   Right ear:           Left ear:           Comments: Hearing eval done at [facility]; pt does qualify for hearing aids due to difficulty with high pitched tones  Vision Screening Comments: Annual vision screenings done at [place] issues and exercise activities discussed: Current Exercise Habits: The patient does not participate in regular exercise at present, Exercise limited by: None identified  Goals    . DIET - EAT MORE FRUITS AND VEGETABLES     Continue healthy eating habits with 3-4 servings of fruits and vegetables per day.    . Increase physical activity     Recommend 150 minutes of physical activity per week      Depression Screen PHQ 2/9 Scores 02/20/2020 09/06/2019 02/23/2019 02/16/2019 02/19/2018 02/08/2018 03/23/2017  PHQ - 2 Score 0 0 0 0 0 0 0  PHQ- 9 Score - 0 0 - 0 - -    Fall Risk Fall Risk  02/20/2020 09/06/2019 02/16/2019 02/08/2018 03/23/2017  Falls in the past year? 0 0 0 1 No  Comment - - - pt slipped when getting out of his truck  -  Number falls in past yr: 0 - 0 0 -  Injury with Fall? 0 - 0 1 -  Comment - - - slight tear in shoulder, completing physical therapy -  Risk for fall due to : No Fall Risks - - - -  Follow up  Falls prevention discussed Falls evaluation completed Falls prevention discussed Falls prevention discussed -    FALL RISK PREVENTION PERTAINING TO THE HOME:  Any stairs in or around the home? Yes  If so, are there any without handrails? Yes - garage steps inside Home free of loose throw rugs in walkways, pet beds, electrical cords, etc? Yes  Adequate lighting in your home to reduce risk of falls? Yes   ASSISTIVE DEVICES UTILIZED TO PREVENT FALLS:  Life alert? No  Use of a cane, walker or w/c? No  Grab bars in the bathroom? No  Shower chair or bench in shower? No  Elevated toilet seat or a handicapped toilet? No   TIMED UP AND GO:  Was the test performed? Yes .  Length of time to ambulate 10 feet: 4 sec.   Gait steady and fast without use of assistive device  Cognitive Function:     6CIT Screen 02/20/2020  What Year? 0 points  What month? 0 points  What time? 0 points  Count back from 20 0 points  Months in reverse 0 points  Repeat phrase 0 points  Total Score 0    Immunizations Immunization History  Administered Date(s) Administered  . Influenza Split 12/08/2012  . Influenza, High Dose Seasonal PF 12/07/2017, 11/30/2019  . Influenza,inj,Quad PF,6+ Mos 01/06/2013, 11/23/2013  . Influenza,inj,quad, With Preservative 12/24/2016, 11/25/2018  . Influenza-Unspecified 01/07/2015, 01/21/2017  . PFIZER SARS-COV-2 Vaccination 03/16/2019, 04/06/2019, 12/02/2019  . Pneumococcal Conjugate-13 01/17/2015  . Pneumococcal Polysaccharide-23 06/27/2010  . Tdap 10/05/2011  . Zoster Recombinat (Shingrix) 02/06/2018, 04/19/2018, 11/09/2018    TDAP status: Up to date  Flu Vaccine status: Up to date  Pneumococcal vaccine status: Up to date  Covid-19 vaccine status: Completed vaccines  Qualifies for Shingles Vaccine? Yes   Zostavax completed Yes   Shingrix Completed?: Yes  Screening Tests Health Maintenance  Topic Date Due  . Hepatitis C Screening  09/05/2020 (Originally  March 31, 1940)  . TETANUS/TDAP  10/04/2021  . INFLUENZA VACCINE  Completed  . COVID-19 Vaccine  Completed  . PNA vac Low Risk Adult  Completed    Health Maintenance  There are no preventive care reminders to display for this patient.  Colorectal cancer screening: No longer required.   Lung Cancer Screening: (Low Dose CT Chest recommended if Age 35-80 years, 30 pack-year currently smoking OR have quit w/in 15years.) does not qualify.   Additional Screening:  Hepatitis C Screening: does qualify; postponed  Vision Screening: Recommended annual ophthalmology exams for early detection of glaucoma and other disorders of the eye. Is the patient up to date with their annual eye exam?  Yes  Who is the provider or what is the name of the office in which the patient attends annual eye exams? [place] Screening: Recommended annual dental exams for proper oral hygiene  Community Resource Referral / Chronic Care Management: CRR required this visit?  No   CCM required this visit?  No      Plan:     I have personally reviewed and noted the following in the patient's chart:   . Medical and social history . Use of alcohol, tobacco or illicit drugs  . Current medications and supplements . Functional ability and status . Nutritional status . Physical activity . Advanced directives . List of other physicians . Hospitalizations, surgeries, and ER visits in previous 12 months . Vitals . Screenings to include cognitive, depression, and falls . Referrals and appointments  In addition, I have reviewed and discussed with patient certain preventive protocols, quality metrics, and best practice recommendations. A written personalized care plan for preventive services as well as general preventive health recommendations were provided to patient.     Clemetine Marker, LPN   [id number]   Nurse Notes: pt doing well and appreciative of visit today. Same day visit with Dr. Ronnald Ramp for  med refill.

## 2020-02-20 NOTE — Progress Notes (Signed)
Date:  02/20/2020   Name:  Marcus Delacruz   DOB:  [birth year]   MRN:  [id number]   Chief Complaint: Hyperlipidemia, Hypertension, and Arthritis (Refill ibuprofen)  Hyperlipidemia This is a chronic problem. The current episode started more than 1 year ago. The problem is controlled. Recent lipid tests were reviewed and are normal. He has no history of chronic renal disease, diabetes, hypothyroidism, liver disease, obesity or nephrotic syndrome. There are no known factors aggravating his hyperlipidemia. Pertinent negatives include no chest pain, focal sensory loss, focal weakness, leg pain, myalgias or shortness of breath. Current antihyperlipidemic treatment includes statins. The current treatment provides moderate improvement of lipids. There are no compliance problems.   Hypertension This is a chronic problem. The current episode started more than 1 year ago. The problem has been gradually improving since onset. The problem is controlled. Pertinent negatives include no chest pain, headaches, neck pain, palpitations or shortness of breath. Risk factors for coronary artery disease include dyslipidemia. Past treatments include ACE inhibitors and diuretics. The current treatment provides moderate improvement. Compliance problems include medication side effects.  There is no history of angina, kidney disease, CAD/MI, CVA, heart failure, left ventricular hypertrophy, PVD or retinopathy. There is no history of chronic renal disease, a hypertension causing med or renovascular disease.  Arthritis Presents for follow-up visit. He reports no pain, stiffness, joint swelling or joint warmth. The symptoms have been improving. Affected locations include the left shoulder and right shoulder. Pertinent negatives include no diarrhea, dysuria, fatigue, fever, pain at night, pain while resting or rash. Compliance problems include medication cost.     Lab Results  Component Value Date   CREATININE 1.03  09/06/2019   BUN 14 09/06/2019   NA 140 09/06/2019   K 4.5 09/06/2019   CL 101 09/06/2019   CO2 25 09/06/2019   Lab Results  Component Value Date   CHOL 175 09/06/2019   HDL 61 09/06/2019   LDLCALC 96 09/06/2019   TRIG 100 09/06/2019   CHOLHDL 3.5 08/20/2017   No results found for: TSH No results found for: HGBA1C Lab Results  Component Value Date   WBC 8.2 08/08/2013   HGB 15.7 07/10/2014   HCT 43.3 08/08/2013   MCV 86.7 08/08/2013   PLT 354.0 08/08/2013   Lab Results  Component Value Date   ALT 24 09/06/2019   AST 47 (H) 09/06/2019   ALKPHOS 68 09/06/2019   BILITOT 0.6 09/06/2019     Review of Systems  Constitutional: Negative for chills, fatigue and fever.  HENT: Negative for drooling, ear discharge, ear pain and sore throat.   Respiratory: Negative for cough, shortness of breath and wheezing.   Cardiovascular: Negative for chest pain, palpitations and leg swelling.  Gastrointestinal: Negative for abdominal pain, blood in stool, constipation, diarrhea and nausea.  Endocrine: Negative for polydipsia.  Genitourinary: Negative for dysuria, frequency, hematuria and urgency.  Musculoskeletal: Positive for arthritis. Negative for back pain, joint swelling, myalgias, neck pain and stiffness.  Skin: Negative for rash.  Allergic/Immunologic: Negative for environmental allergies.  Neurological: Negative for dizziness, focal weakness and headaches.  Hematological: Does not bruise/bleed easily.  Psychiatric/Behavioral: Negative for suicidal ideas. The patient is not nervous/anxious.     Patient Active Problem List   Diagnosis Date Noted  . Chronic obstructive pulmonary disease with acute exacerbation ([place]) 07/27/2017  . Asthma, chronic, severe persistent, uncomplicated 40/98/1191  . Seasonal allergic rhinitis due to pollen 08/18/2016  . Bilateral carotid artery stenosis 04/03/2016  .  Bilateral hydrocele 11/22/2014  . BPH with obstruction/lower urinary tract symptoms  10/05/2014  . Epididymal cyst 10/05/2014  . Familial multiple lipoprotein-type hyperlipidemia 07/14/2014  . Laboratory animal allergy 07/14/2014  . Acute arthropathy 07/14/2014  . Routine general medical examination at a health care facility 07/14/2014  . Asthma with exacerbation 07/14/2014  . Essential (primary) hypertension 07/14/2014  . Screening for depression 07/14/2014  . Noncompliance w/medication treatment due to intermit use of medication 11/23/2013  . Asthma, chronic 08/08/2013    Allergies  Allergen Reactions  . Codeine Itching    Past Surgical History:  Procedure Laterality Date  . ARTERY REPAIR    . BACK SURGERY    . CHOLECYSTECTOMY    . COLONOSCOPY  2013   cleared  . HERNIA REPAIR      Social History   Tobacco Use  . Smoking status: Former Smoker    Packs/day: 1.00    Years: 20.00    Pack years: 20.00    Types: Cigarettes    Quit date: 08/09/1983    Years since quitting: 36.5  . Smokeless tobacco: Never Used  . Tobacco comment: quit smoking over 40 years ago  Vaping Use  . Vaping Use: Never used  Substance Use Topics  . Alcohol use: Yes    Alcohol/week: 0.0 standard drinks    Comment: once monthly  . Drug use: No     Medication list has been reviewed and updated.  Current Meds  Medication Sig  . albuterol (VENTOLIN HFA) 108 (90 Base) MCG/ACT inhaler USE 2 PUFFS EVERY 6 HOURS AS NEEDED SHORTNESS OF BREATH OR WHEEZING  . ALLERGY RELIEF 180 MG tablet TAKE (1) TABLET BY MOUTH EVERY DAY  . aspirin 81 MG tablet Take 81 mg by mouth daily.  Marland Kitchen atorvastatin (LIPITOR) 40 MG tablet TAKE (1) TABLET BY MOUTH EVERY DAY  . carbamide peroxide (DEBROX) 6.5 % OTIC solution Place 5 drops into the right ear 2 (two) times daily.  . finasteride (PROSCAR) 5 MG tablet TAKE (1) TABLET BY MOUTH EVERY DAY  . Fluticasone-Umeclidin-Vilant (TRELEGY ELLIPTA) 100-62.5-25 MCG/INH AEPB Inhale 1 puff into the lungs daily.  Marland Kitchen ibuprofen (ADVIL) 800 MG tablet TAKE (1) TABLET BY  MOUTH THREE TIMES A DAY  . KETOCONAZOLE-HYDROCORTISONE EX Apply topically.  Marland Kitchen lisinopril-hydrochlorothiazide (ZESTORETIC) 10-12.5 MG tablet TAKE (1) TABLET BY MOUTH EVERY DAY  . montelukast (SINGULAIR) 10 MG tablet TAKE ONE (1) TABLET BY MOUTH ONCE DAILY  . NON FORMULARY cpap device   Current Facility-Administered Medications for the 02/20/20 encounter (Office Visit) with Juline Patch, MD  Medication  . ipratropium-albuterol (DUONEB) 0.5-2.5 (3) MG/3ML nebulizer solution 3 mL  . ipratropium-albuterol (DUONEB) 0.5-2.5 (3) MG/3ML nebulizer solution 3 mL    PHQ 2/9 Scores 02/20/2020 09/06/2019 02/23/2019 02/16/2019  PHQ - 2 Score 0 0 0 0  PHQ- 9 Score - 0 0 -    GAD 7 : Generalized Anxiety Score 09/06/2019 02/23/2019  Nervous, Anxious, on Edge 0 0  Control/stop worrying 0 0  Worry too much - different things 0 0  Trouble relaxing 0 0  Restless 0 0  Easily annoyed or irritable 0 0  Afraid - awful might happen 0 0  Total GAD 7 Score 0 0    BP Readings from Last 3 Encounters:  02/20/20 110/60  02/20/20 (!) 126/58  02/20/20 (!) 163/68    Physical Exam Vitals and nursing note reviewed.  HENT:     Head: Normocephalic.     Right Ear: Tympanic membrane, ear  canal and external ear normal.     Left Ear: Tympanic membrane, ear canal and external ear normal.     Nose: Nose normal. No congestion or rhinorrhea.     Mouth/Throat:     Mouth: Oropharynx is clear and moist. Mucous membranes are moist.  Eyes:     General: No scleral icterus.       Right eye: No discharge.        Left eye: No discharge.     Extraocular Movements: EOM normal.     Conjunctiva/sclera: Conjunctivae normal.     Pupils: Pupils are equal, round, and reactive to light.  Neck:     Thyroid: No thyromegaly.     Vascular: No JVD.     Trachea: No tracheal deviation.  Cardiovascular:     Rate and Rhythm: Normal rate and regular rhythm.     Pulses: Intact distal pulses.     Heart sounds: Normal heart sounds. No  murmur heard. No friction rub. No gallop.   Pulmonary:     Effort: No respiratory distress.     Breath sounds: Normal breath sounds. No wheezing, rhonchi or rales.  Chest:     Chest wall: No tenderness.  Abdominal:     General: Bowel sounds are normal.     Palpations: Abdomen is soft. There is no hepatosplenomegaly or mass.     Tenderness: There is no abdominal tenderness. There is no CVA tenderness, guarding or rebound.  Musculoskeletal:        General: No tenderness or edema. Normal range of motion.     Cervical back: Normal range of motion and neck supple.  Lymphadenopathy:     Cervical: No cervical adenopathy.  Skin:    General: Skin is warm.     Findings: No bruising, erythema or rash.  Neurological:     Mental Status: He is alert and oriented to person, place, and time.     Cranial Nerves: No cranial nerve deficit.     Deep Tendon Reflexes: Strength normal and reflexes are normal and symmetric.     Wt Readings from Last 3 Encounters:  02/20/20 179 lb (81.2 kg)  02/20/20 179 lb 3.2 oz (81.3 kg)  02/20/20 181 lb (82.1 kg)    BP 110/60   Pulse 60   Ht 5\' 10"  (1.778 m)   Wt 179 lb (81.2 kg)   BMI 25.68 kg/m   Assessment and Plan:  Patient's chart was reviewed for previous encounters, most recent labs, most recent imaging, and care everywhere.  1. Essential (primary) hypertension Chronic.  Controlled.  Stable.  Blood pressure 110/60.  Patient doing well on current dosing and will continue on lisinopril hydrochlorothiazide 10-12.5 mg once a day.  Will check CMP for electrolytes and GFR as well as hepatic function and transaminases. - lisinopril-hydrochlorothiazide (ZESTORETIC) 10-12.5 MG tablet; TAKE (1) TABLET BY MOUTH EVERY DAY  Dispense: 90 tablet; Refill: 1 - Comprehensive metabolic panel  2. Familial multiple lipoprotein-type hyperlipidemia Chronic.  Controlled.  Stable.  Continue atorvastatin 40 mg once a day.  Will recheck lipid panel with ratio. -  atorvastatin (LIPITOR) 40 MG tablet; Take 1 tablet daily  Dispense: 90 tablet; Refill: 1  3. Chronic left shoulder pain Chronic.  Controlled.  Stable.  Left shoulder pain secondary to bursitis is probably rotator cuff tear of the right.  Patient will continue with current dosing of ibuprofen 800 mg up to 3 times a day as needed pain. - ibuprofen (ADVIL) 800 MG tablet; Take 1  tablet up to three times daily as needed for arthritis pain  Dispense: 90 tablet; Refill: 1

## 2020-02-20 NOTE — Patient Instructions (Signed)
[name] , Thank you for taking time to come for your Medicare Wellness Visit. I appreciate your ongoing commitment to your health goals. Please review the following plan we discussed and let me know if I can assist you in the future.   Screening recommendations/referrals: Colonoscopy: no longer required Recommended yearly ophthalmology/optometry visit for glaucoma screening and checkup Recommended yearly dental visit for hygiene and checkup  Vaccinations: Influenza vaccine: done 11/30/19 Pneumococcal vaccine: done 01/17/15 Tdap vaccine: done 10/05/11 Shingles vaccine: Shingrix series completed 11/09/18   Covid-19: done 03/16/19, 04/06/19 & 12/02/19  Advanced directives: Please bring a copy of your health care power of attorney and living will to the office at your convenience.  Conditions/risks identified: Recommend increasing physical activity to 150 minutes per week   Next appointment: Follow up in one year for your annual wellness visit.   Preventive Care 24 Years and Older, Male Preventive care refers to lifestyle choices and visits with your health care provider that can promote health and wellness. What does preventive care include?  A yearly physical exam. This is also called an annual well check.  Dental exams once or twice a year.  Routine eye exams. Ask your health care provider how often you should have your eyes checked.  Personal lifestyle choices, including:  Daily care of your teeth and gums.  Regular physical activity.  Eating a healthy diet.  Avoiding tobacco and drug use.  Limiting alcohol use.  Practicing safe sex.  Taking low doses of aspirin every day.  Taking vitamin and mineral supplements as recommended by your health care provider. What happens during an annual well check? The services and screenings done by your health care provider during your annual well check will depend on your age, overall health, lifestyle risk factors, and family history  of disease. Counseling  Your health care provider may ask you questions about your:  Alcohol use.  Tobacco use.  Drug use.  Emotional well-being.  Home and relationship well-being.  Sexual activity.  Eating habits.  History of falls.  Memory and ability to understand (cognition).  Work and work Statistician. Screening  You may have the following tests or measurements:  Height, weight, and BMI.  Blood pressure.  Lipid and cholesterol levels. These may be checked every 5 years, or more frequently if you are over 67 years old.  Skin check.  Lung cancer screening. You may have this screening every year starting at age 89 if you have a 30-pack-year history of smoking and currently smoke or have quit within the past 15 years.  Fecal occult blood test (FOBT) of the stool. You may have this test every year starting at age 47.  Flexible sigmoidoscopy or colonoscopy. You may have a sigmoidoscopy every 5 years or a colonoscopy every 10 years starting at age 44.  Prostate cancer screening. Recommendations will vary depending on your family history and other risks.  Hepatitis C blood test.  Hepatitis B blood test.  Sexually transmitted disease (STD) testing.  Diabetes screening. This is done by checking your blood sugar (glucose) after you have not eaten for a while (fasting). You may have this done every 1-3 years.  Abdominal aortic aneurysm (AAA) screening. You may need this if you are a current or former smoker.  Osteoporosis. You may be screened starting at age 57 if you are at high risk. Talk with your health care provider about your test results, treatment options, and if necessary, the need for more tests. Vaccines  Your health  care provider may recommend certain vaccines, such as:  Influenza vaccine. This is recommended every year.  Tetanus, diphtheria, and acellular pertussis (Tdap, Td) vaccine. You may need a Td booster every 10 years.  Zoster vaccine. You may  need this after age 8.  Pneumococcal 13-valent conjugate (PCV13) vaccine. One dose is recommended after age 66.  Pneumococcal polysaccharide (PPSV23) vaccine. One dose is recommended after age 40. Talk to your health care provider about which screenings and vaccines you need and how often you need them. This information is not intended to replace advice given to you by your health care provider. Make sure you discuss any questions you have with your health care provider. Document Released: 03/23/2015 Document Revised: 11/14/2015 Document Reviewed: 12/26/2014 Elsevier Interactive Patient Education  2017 Marcus Delacruz Prevention in the Home Falls can cause injuries. They can happen to people of all ages. There are many things you can do to make your home safe and to help prevent falls. What can I do on the outside of my home?  Regularly fix the edges of walkways and driveways and fix any cracks.  Remove anything that might make you trip as you walk through a door, such as a raised step or threshold.  Trim any bushes or trees on the path to your home.  Use bright outdoor lighting.  Clear any walking paths of anything that might make someone trip, such as rocks or tools.  Regularly check to see if handrails are loose or broken. Make sure that both sides of any steps have handrails.  Any raised decks and porches should have guardrails on the edges.  Have any leaves, snow, or ice cleared regularly.  Use sand or salt on walking paths during winter.  Clean up any spills in your garage right away. This includes oil or grease spills. What can I do in the bathroom?  Use night lights.  Install grab bars by the toilet and in the tub and shower. Do not use towel bars as grab bars.  Use non-skid mats or decals in the tub or shower.  If you need to sit down in the shower, use a plastic, non-slip stool.  Keep the floor dry. Clean up any water that spills on the floor as soon as it  happens.  Remove soap buildup in the tub or shower regularly.  Attach bath mats securely with double-sided non-slip rug tape.  Do not have throw rugs and other things on the floor that can make you trip. What can I do in the bedroom?  Use night lights.  Make sure that you have a light by your bed that is easy to reach.  Do not use any sheets or blankets that are too big for your bed. They should not hang down onto the floor.  Have a firm chair that has side arms. You can use this for support while you get dressed.  Do not have throw rugs and other things on the floor that can make you trip. What can I do in the kitchen?  Clean up any spills right away.  Avoid walking on wet floors.  Keep items that you use a lot in easy-to-reach places.  If you need to reach something above you, use a strong step stool that has a grab bar.  Keep electrical cords out of the way.  Do not use floor polish or wax that makes floors slippery. If you must use wax, use non-skid floor wax.  Do not have  throw rugs and other things on the floor that can make you trip. What can I do with my stairs?  Do not leave any items on the stairs.  Make sure that there are handrails on both sides of the stairs and use them. Fix handrails that are broken or loose. Make sure that handrails are as long as the stairways.  Check any carpeting to make sure that it is firmly attached to the stairs. Fix any carpet that is loose or worn.  Avoid having throw rugs at the top or bottom of the stairs. If you do have throw rugs, attach them to the floor with carpet tape.  Make sure that you have a light switch at the top of the stairs and the bottom of the stairs. If you do not have them, ask someone to add them for you. What else can I do to help prevent falls?  Wear shoes that:  Do not have high heels.  Have rubber bottoms.  Are comfortable and fit you well.  Are closed at the toe. Do not wear sandals.  If you  use a stepladder:  Make sure that it is fully opened. Do not climb a closed stepladder.  Make sure that both sides of the stepladder are locked into place.  Ask someone to hold it for you, if possible.  Clearly mark and make sure that you can see:  Any grab bars or handrails.  First and last steps.  Where the edge of each step is.  Use tools that help you move around (mobility aids) if they are needed. These include:  Canes.  Walkers.  Scooters.  Crutches.  Turn on the lights when you go into a dark area. Replace any light bulbs as soon as they burn out.  Set up your furniture so you have a clear path. Avoid moving your furniture around.  If any of your floors are uneven, fix them.  If there are any pets around you, be aware of where they are.  Review your medicines with your doctor. Some medicines can make you feel dizzy. This can increase your chance of falling. Ask your doctor what other things that you can do to help prevent falls. This information is not intended to replace advice given to you by your health care provider. Make sure you discuss any questions you have with your health care provider. Document Released: 12/21/2008 Document Revised: 08/02/2015 Document Reviewed: 03/31/2014 Elsevier Interactive Patient Education  2017 [employer].

## 2020-02-21 ENCOUNTER — Ambulatory Visit: Payer: Medicare PPO | Admitting: Family Medicine

## 2020-02-21 LAB — COMPREHENSIVE METABOLIC PANEL
ALT: 30 IU/L (ref 0–44)
AST: 61 IU/L — ABNORMAL HIGH (ref 0–40)
Albumin/Globulin Ratio: 1.7 (ref 1.2–2.2)
Albumin: 4.5 g/dL (ref 3.7–4.7)
Alkaline Phosphatase: 77 IU/L (ref 44–121)
BUN/Creatinine Ratio: 17 (ref 10–24)
BUN: 23 mg/dL (ref 8–27)
Bilirubin Total: 0.5 mg/dL (ref 0.0–1.2)
CO2: 29 mmol/L (ref 20–29)
Calcium: 9.6 mg/dL (ref 8.6–10.2)
Chloride: 99 mmol/L (ref 96–106)
Creatinine, Ser: 1.38 mg/dL — ABNORMAL HIGH (ref 0.76–1.27)
GFR calc Af Amer: 56 mL/min/{1.73_m2} — ABNORMAL LOW (ref >59)
GFR calc non Af Amer: 49 mL/min/{1.73_m2} — ABNORMAL LOW (ref >59)
Globulin, Total: 2.7 g/dL (ref 1.5–4.5)
Glucose: 99 mg/dL (ref 65–99)
Potassium: 4.2 mmol/L (ref 3.5–5.2)
Sodium: 142 mmol/L (ref 134–144)
Total Protein: 7.2 g/dL (ref 6.0–8.5)

## 2020-02-28 DIAGNOSIS — G4733 Obstructive sleep apnea (adult) (pediatric): Secondary | ICD-10-CM | POA: Diagnosis not present

## 2020-03-05 ENCOUNTER — Other Ambulatory Visit: Payer: Self-pay

## 2020-03-05 ENCOUNTER — Encounter: Payer: Self-pay | Admitting: Internal Medicine

## 2020-03-05 ENCOUNTER — Ambulatory Visit: Payer: Medicare PPO | Admitting: Internal Medicine

## 2020-03-05 VITALS — BP 138/64 | HR 72 | Temp 97.5°F | Resp 16 | Ht 70.0 in | Wt 182.4 lb

## 2020-03-05 DIAGNOSIS — Z7189 Other specified counseling: Secondary | ICD-10-CM | POA: Diagnosis not present

## 2020-03-05 DIAGNOSIS — G4733 Obstructive sleep apnea (adult) (pediatric): Secondary | ICD-10-CM

## 2020-03-05 DIAGNOSIS — J449 Chronic obstructive pulmonary disease, unspecified: Secondary | ICD-10-CM

## 2020-03-05 DIAGNOSIS — Z9989 Dependence on other enabling machines and devices: Secondary | ICD-10-CM | POA: Diagnosis not present

## 2020-03-05 DIAGNOSIS — R0602 Shortness of breath: Secondary | ICD-10-CM

## 2020-03-05 NOTE — Progress Notes (Signed)
[facility] [place], [place] [address]  Pulmonary Sleep Medicine   Office Visit Note  Patient Name: Marcus Delacruz DOB: 09-23-1940 MRN [id number]  Date of Service: 03/10/2020  Complaints/HPI: Patient is here for routine pulmonary follow-up Followed for COPD and OSA treated with CPAP Reports nightly compliance with CPAP machine--current machine is more than 78 years old and received a letter from manufacturer that machine is at the end of its life Continues to use Trelegy daily for COPD--has noticed significant improvement in his breathing since using Trelegy No complaints of shortness of breath, coughing or wheezing, no acute exacerbations or hopsitalizations   ROS  General: (-) fever, (-) chills, (-) night sweats, (-) weakness Skin: (-) rashes, (-) itching,. Eyes: (-) visual changes, (-) redness, (-) itching. Nose and Sinuses: (-) nasal stuffiness or itchiness, (-) postnasal drip, (-) nosebleeds, (-) sinus trouble. Mouth and Throat: (-) sore throat, (-) hoarseness. Neck: (-) swollen glands, (-) enlarged thyroid, (-) neck pain. Respiratory: - cough, (-) bloody sputum, - shortness of breath, - wheezing. Cardiovascular: - ankle swelling, (-) chest pain. Lymphatic: (-) lymph node enlargement. Neurologic: (-) numbness, (-) tingling. Psychiatric: (-) anxiety, (-) depression   Current Medication: Outpatient Encounter Medications as of 03/05/2020  Medication Sig Note  . albuterol (VENTOLIN HFA) 108 (90 Base) MCG/ACT inhaler USE 2 PUFFS EVERY 6 HOURS AS NEEDED SHORTNESS OF BREATH OR WHEEZING   . ALLERGY RELIEF 180 MG tablet TAKE (1) TABLET BY MOUTH EVERY DAY   . aspirin 81 MG tablet Take 81 mg by mouth daily.   Marland Kitchen atorvastatin (LIPITOR) 40 MG tablet Take 1 tablet daily   . carbamide peroxide (DEBROX) 6.5 % OTIC solution Place 5 drops into the right ear 2 (two) times daily. 02/20/2020: PRN only  . finasteride (PROSCAR) 5 MG tablet TAKE (1) TABLET BY  MOUTH EVERY DAY   . Fluticasone-Umeclidin-Vilant (TRELEGY ELLIPTA) 100-62.5-25 MCG/INH AEPB Inhale 1 puff into the lungs daily.   Marland Kitchen ibuprofen (ADVIL) 800 MG tablet Take 1 tablet up to three times daily as needed for arthritis pain   . KETOCONAZOLE-HYDROCORTISONE EX Apply topically. 02/20/2020: Dr. Dierdre Searles  . lisinopril-hydrochlorothiazide (ZESTORETIC) 10-12.5 MG tablet TAKE (1) TABLET BY MOUTH EVERY DAY   . montelukast (SINGULAIR) 10 MG tablet TAKE ONE (1) TABLET BY MOUTH ONCE DAILY   . NON FORMULARY cpap device    Facility-Administered Encounter Medications as of 03/05/2020  Medication  . ipratropium-albuterol (DUONEB) 0.5-2.5 (3) MG/3ML nebulizer solution 3 mL  . ipratropium-albuterol (DUONEB) 0.5-2.5 (3) MG/3ML nebulizer solution 3 mL    Surgical History: Past Surgical History:  Procedure Laterality Date  . ARTERY REPAIR    . BACK SURGERY    . CHOLECYSTECTOMY    . COLONOSCOPY  2013   cleared  . HERNIA REPAIR      Medical History: Past Medical History:  Diagnosis Date  . Allergic rhinitis due to pollen   . Asthma   . BPH (benign prostatic hyperplasia)   . Cardiomegaly   . Chronic obstructive pulmonary disease (COPD) ([place])   . Hypercholesteremia   . Hypersomnia, unspecified   . Hypertension   . Obstructive sleep apnea (adult) (pediatric)   . Shortness of breath   . Snoring   . Solitary pulmonary nodule     Family History: Family History  Problem Relation Age of Onset  . COPD Mother   . Asthma Son   . Cancer Father        lung  . Prostate cancer Neg  Hx   . Kidney disease Neg Hx     Social History: Social History   Socioeconomic History  . Marital status: Married    Spouse name: Not on file  . Number of children: 3  . Years of education: Not on file  . Highest education level: Associate degree: academic program  Occupational History  . Occupation: retired  Tobacco Use  . Smoking status: Former Smoker    Packs/day: 1.00    Years: 20.00    Pack  years: 20.00    Types: Cigarettes    Quit date: 08/09/1983    Years since quitting: 36.6  . Smokeless tobacco: Never Used  . Tobacco comment: quit smoking over 40 years ago  Vaping Use  . Vaping Use: Never used  Substance and Sexual Activity  . Alcohol use: Yes    Alcohol/week: 0.0 standard drinks    Comment: once monthly  . Drug use: No  . Sexual activity: Not on file  Other Topics Concern  . Not on file  Social History Narrative  . Not on file   Social Determinants of Health   Financial Resource Strain: Low Risk   . Difficulty of Paying Living Expenses: Not hard at all  Food Insecurity: No Food Insecurity  . Worried About Charity fundraiser in the Last Year: Never true  . Ran Out of Food in the Last Year: Never true  Transportation Needs: No Transportation Needs  . Lack of Transportation (Medical): No  . Lack of Transportation (Non-Medical): No  Physical Activity: Inactive  . Days of Exercise per Week: 0 days  . Minutes of Exercise per Session: 0 min  Stress: No Stress Concern Present  . Feeling of Stress : Not at all  Social Connections: Moderately Integrated  . Frequency of Communication with Friends and Family: More than three times a week  . Frequency of Social Gatherings with Friends and Family: Not on file  . Attends Religious Services: More than 4 times per year  . Active Member of Clubs or Organizations: No  . Attends Archivist Meetings: Never  . Marital Status: Married  Human resources officer Violence: Not At Risk  . Fear of Current or Ex-Partner: No  . Emotionally Abused: No  . Physically Abused: No  . Sexually Abused: No    Vital Signs: Blood pressure 138/64, pulse 72, temperature (!) 97.5 F (36.4 C), resp. rate 16, height 5' 10" (1.778 m), weight 182 lb 6.4 oz (82.7 kg), SpO2 97 %.  Examination: General Appearance: The patient is well-developed, well-nourished, and in no distress. Skin: Gross inspection of skin unremarkable. Head:  normocephalic, no gross deformities. Eyes: no gross deformities noted. ENT: ears appear grossly normal no exudates. Neck: Supple. No thyromegaly. No LAD. Respiratory: Clear throughout, no wheezing, rhonchi or rales noted. Cardiovascular: Normal S1 and S2 without murmur or rub. Extremities: No cyanosis. pulses are equal. Neurologic: Alert and oriented. No involuntary movements.  LABS: Recent Results (from the past 2160 hour(s))  BLADDER SCAN AMB NON-IMAGING     Status: None   Collection Time: 02/20/20 10:05 AM  Result Value Ref Range   Scan Result 0 ml   Comprehensive metabolic panel     Status: Abnormal   Collection Time: 02/20/20  4:39 PM  Result Value Ref Range   Glucose 99 65 - 99 mg/dL   BUN 23 8 - 27 mg/dL   Creatinine, Ser 1.38 (H) 0.76 - 1.27 mg/dL   GFR calc non Af Amer 49 (L) >  59 mL/min/1.73   GFR calc Af Amer 56 (L) >59 mL/min/1.73    Comment: **In accordance with recommendations from the NKF-ASN Task force,**   [facility] is in the process of updating its eGFR calculation to the   2021 CKD-EPI creatinine equation that estimates kidney function   without a race variable.    BUN/Creatinine Ratio 17 10 - 24   Sodium 142 134 - 144 mmol/L   Potassium 4.2 3.5 - 5.2 mmol/L   Chloride 99 96 - 106 mmol/L   CO2 29 20 - 29 mmol/L   Calcium 9.6 8.6 - 10.2 mg/dL   Total Protein 7.2 6.0 - 8.5 g/dL   Albumin 4.5 3.7 - 4.7 g/dL   Globulin, Total 2.7 1.5 - 4.5 g/dL   Albumin/Globulin Ratio 1.7 1.2 - 2.2   Bilirubin Total 0.5 0.0 - 1.2 mg/dL   Alkaline Phosphatase 77 44 - 121 IU/L    Comment:               **Please note reference interval change**   AST 61 (H) 0 - 40 IU/L   ALT 30 0 - 44 IU/L    Radiology: DG Shoulder Right  Result Date: 01/08/2018 CLINICAL DATA:  persistent right shoulder pain EXAM: RIGHT SHOULDER - 2+ VIEW COMPARISON:  None. FINDINGS: No acute fracture. No dislocation.  Unremarkable soft tissues. IMPRESSION: No acute bony pathology. Electronically Signed   By:  Marybelle Killings M.D.   On: 01/08/2018 07:16    No results found.  No results found.    Assessment and Plan: Patient Active Problem List   Diagnosis Date Noted  . Chronic obstructive pulmonary disease with acute exacerbation ([place]) 07/27/2017  . Asthma, chronic, severe persistent, uncomplicated 27/25/[birth year]64  . Seasonal allergic rhinitis due to pollen 08/18/2016  . Bilateral carotid artery stenosis 04/03/2016  . Bilateral hydrocele 11/22/2014  . BPH with obstruction/lower urinary tract symptoms 10/05/2014  . Epididymal cyst 10/05/2014  . Familial multiple lipoprotein-type hyperlipidemia 07/14/2014  . Laboratory animal allergy 07/14/2014  . Acute arthropathy 07/14/2014  . Routine general medical examination at a health care facility 07/14/2014  . Asthma with exacerbation 07/14/2014  . Essential (primary) hypertension 07/14/2014  . Screening for depression 07/14/2014  . Noncompliance w/medication treatment due to intermit use of medication 11/23/2013  . Asthma, chronic 08/08/2013   1. OSA on CPAP Will order new CPAP as old machine as at end of life Informed CPAP machines are currently on back order--minimum time of 6 months - For home use only DME continuous positive airway pressure (CPAP)  2. CPAP use counseling Discussed importance of adequate CPAP use as well as proper care and cleaning techniques of machine and all supplies.  3. Chronic obstructive pulmonary disease, unspecified COPD type ([place]) Symptoms remain well controlled, continue daily Trelegy use  4. SOB (shortness of breath) Spirometry stable today, FEV1 2.0L, 69% predicted value - Spirometry with Graph   General Counseling: I have discussed the findings of the evaluation and examination with Sheldon.  I have also discussed any further diagnostic evaluation thatmay be needed or ordered today. Fain verbalizes understanding of the findings of todays visit. We also reviewed his medications today and discussed drug  interactions and side effects including but not limited excessive drowsiness and altered mental states. We also discussed that there is always a risk not just to him but also people around him. he has been encouraged to call the office with any questions or concerns that should arise related to todays visit.  Orders Placed This Encounter  Procedures  . For home use only DME continuous positive airway pressure (CPAP)    Keep settings as prescribed previously    Order Specific Question:   Length of Need    Answer:   Lifetime    Order Specific Question:   Patient has OSA or probable OSA    Answer:   Yes    Order Specific Question:   Settings    Answer:   Other see comments    Order Specific Question:   CPAP supplies needed    Answer:   Mask, headgear, cushions, filters, heated tubing and water chamber  . Spirometry with Graph    Order Specific Question:   Where should this test be performed?    Answer:   [facility]    Order Specific Question:   Basic spirometry    Answer:   Yes     Time spent: 30  I have personally obtained a history, examined the patient, evaluated laboratory and imaging results, formulated the assessment and plan and placed orders. This patient was seen by Casey Burkitt AGNP-C in Collaboration with Dr. Devona Konig as a part of collaborative care agreement.    Allyne Gee, MD [facility] Pulmonary and Critical Care Sleep medicine

## 2020-03-10 ENCOUNTER — Encounter: Payer: Self-pay | Admitting: Internal Medicine

## 2020-03-13 ENCOUNTER — Telehealth: Payer: Self-pay

## 2020-03-13 NOTE — Telephone Encounter (Signed)
Order for CPAP signed and placed in [facility] folder behind Tat for pick up

## 2020-04-03 DIAGNOSIS — L570 Actinic keratosis: Secondary | ICD-10-CM | POA: Diagnosis not present

## 2020-04-18 DIAGNOSIS — G4733 Obstructive sleep apnea (adult) (pediatric): Secondary | ICD-10-CM | POA: Diagnosis not present

## 2020-05-14 ENCOUNTER — Other Ambulatory Visit: Payer: Self-pay | Admitting: Family Medicine

## 2020-05-14 DIAGNOSIS — J4550 Severe persistent asthma, uncomplicated: Secondary | ICD-10-CM

## 2020-05-14 DIAGNOSIS — J301 Allergic rhinitis due to pollen: Secondary | ICD-10-CM

## 2020-05-14 NOTE — Telephone Encounter (Signed)
Requested Prescriptions  Pending Prescriptions Disp Refills  . montelukast (SINGULAIR) 10 MG tablet [Pharmacy Med Name: MONTELUKAST SODIUM 10 MG TAB] 90 tablet 1    Sig: TAKE (1) TABLET BY MOUTH EVERY DAY     Pulmonology:  Leukotriene Inhibitors Passed - 05/14/2020 10:05 AM      Passed - Valid encounter within last 12 months    Recent Outpatient Visits          2 months ago Essential (primary) hypertension   [facility] Juline Patch, MD   8 months ago Essential (primary) hypertension   [facility] Juline Patch, MD   1 year ago Elevated AST (SGOT)   [facility] Juline Patch, MD   1 year ago Encounter for annual physical exam   [facility] Juline Patch, MD   2 years ago Essential (primary) hypertension   [place], MD      Future Appointments            In 2 months Juline Patch, MD [facility], [facility]

## 2020-05-16 ENCOUNTER — Other Ambulatory Visit: Payer: Self-pay | Admitting: Family Medicine

## 2020-05-16 ENCOUNTER — Other Ambulatory Visit: Payer: Self-pay | Admitting: Urology

## 2020-05-16 DIAGNOSIS — J301 Allergic rhinitis due to pollen: Secondary | ICD-10-CM

## 2020-05-16 DIAGNOSIS — J4550 Severe persistent asthma, uncomplicated: Secondary | ICD-10-CM

## 2020-05-22 ENCOUNTER — Encounter (INDEPENDENT_AMBULATORY_CARE_PROVIDER_SITE_OTHER): Payer: Medicare PPO

## 2020-05-22 ENCOUNTER — Ambulatory Visit (INDEPENDENT_AMBULATORY_CARE_PROVIDER_SITE_OTHER): Payer: Medicare PPO | Admitting: Vascular Surgery

## 2020-05-30 ENCOUNTER — Ambulatory Visit: Payer: Medicare PPO

## 2020-06-06 ENCOUNTER — Ambulatory Visit: Payer: Medicare PPO

## 2020-06-06 DIAGNOSIS — G4733 Obstructive sleep apnea (adult) (pediatric): Secondary | ICD-10-CM | POA: Diagnosis not present

## 2020-06-08 DIAGNOSIS — L570 Actinic keratosis: Secondary | ICD-10-CM | POA: Diagnosis not present

## 2020-06-15 ENCOUNTER — Ambulatory Visit (INDEPENDENT_AMBULATORY_CARE_PROVIDER_SITE_OTHER): Payer: Self-pay | Admitting: Vascular Surgery

## 2020-06-15 ENCOUNTER — Encounter (INDEPENDENT_AMBULATORY_CARE_PROVIDER_SITE_OTHER): Payer: Medicare Other

## 2020-06-16 DIAGNOSIS — G4733 Obstructive sleep apnea (adult) (pediatric): Secondary | ICD-10-CM | POA: Diagnosis not present

## 2020-07-06 DIAGNOSIS — G4733 Obstructive sleep apnea (adult) (pediatric): Secondary | ICD-10-CM | POA: Diagnosis not present

## 2020-07-12 ENCOUNTER — Encounter: Payer: Self-pay | Admitting: Family Medicine

## 2020-07-12 ENCOUNTER — Ambulatory Visit: Payer: Medicare Other | Admitting: Family Medicine

## 2020-07-12 ENCOUNTER — Other Ambulatory Visit: Payer: Self-pay

## 2020-07-12 ENCOUNTER — Other Ambulatory Visit (INDEPENDENT_AMBULATORY_CARE_PROVIDER_SITE_OTHER): Payer: Self-pay | Admitting: Vascular Surgery

## 2020-07-12 ENCOUNTER — Telehealth: Payer: Self-pay

## 2020-07-12 VITALS — BP 162/70 | HR 62 | Ht 70.0 in | Wt 177.0 lb

## 2020-07-12 DIAGNOSIS — I10 Essential (primary) hypertension: Secondary | ICD-10-CM | POA: Diagnosis not present

## 2020-07-12 DIAGNOSIS — G4452 New daily persistent headache (NDPH): Secondary | ICD-10-CM | POA: Diagnosis not present

## 2020-07-12 DIAGNOSIS — I6523 Occlusion and stenosis of bilateral carotid arteries: Secondary | ICD-10-CM

## 2020-07-12 MED ORDER — LISINOPRIL-HYDROCHLOROTHIAZIDE 10-12.5 MG PO TABS
ORAL_TABLET | ORAL | 1 refills | Status: DC
Start: 2020-07-12 — End: 2021-03-13

## 2020-07-12 NOTE — Telephone Encounter (Unsigned)
Copied from [place] [id number]. Topic: General - Other >> Jul 12, 2020  9:25 AM Leward Quan A wrote: Reason for CRM: Patient called in to inform Baxter Flattery that his BP is elevated on ( 07/11/20 at dentist 186/86 ) ( morning at home  168/96 )  // ( 07/12/20 163/81 ) need to know if Dr would want him to come in today Please call Ph# [phone]

## 2020-07-12 NOTE — Patient Instructions (Signed)
How to Take Your Blood Pressure Blood pressure measures how strongly your blood is pressing against the walls of your arteries. Arteries are blood vessels that carry blood from your heart throughout your body. You can take your blood pressure at home with a machine. You may need to check your blood pressure at home:  To check if you have high blood pressure (hypertension).  To check your blood pressure over time.  To make sure your blood pressure medicine is working. Supplies needed:  Blood pressure machine, or monitor.  Dining room chair to sit in.  Table or desk.  Small notebook.  Pencil or pen. How to prepare Avoid these things for 30 minutes before checking your blood pressure:  Having drinks with caffeine in them, such as coffee or tea.  Drinking alcohol.  Eating.  Smoking.  Exercising. Do these things five minutes before checking your blood pressure:  Go to the bathroom and pee (urinate).  Sit in a dining chair. Do not sit in a soft couch or an armchair.  Be quiet. Do not talk. How to take your blood pressure Follow the instructions that came with your machine. If you have a digital blood pressure monitor, these may be the instructions: 1. Sit up straight. 2. Place your feet on the floor. Do not cross your ankles or legs. 3. Rest your left arm at the level of your heart. You may rest it on a table, desk, or chair. 4. Pull up your shirt sleeve. 5. Wrap the blood pressure cuff around the upper part of your left arm. The cuff should be 1 inch (2.5 cm) above your elbow. It is best to wrap the cuff around bare skin. 6. Fit the cuff snugly around your arm. You should be able to place only one finger between the cuff and your arm. 7. Place the cord so that it rests in the bend of your elbow. 8. Press the power button. 9. Sit quietly while the cuff fills with air and loses air. 10. Write down the numbers on the screen. 11. Wait 2-3 minutes and then repeat steps 1-10.    What do the numbers mean? Two numbers make up your blood pressure. The first number is called systolic pressure. The second is called diastolic pressure. An example of a blood pressure reading is "120 over 80" (or 120/80). If you are an adult and do not have a medical condition, use this guide to find out if your blood pressure is normal: Normal  First number: below 120.  Second number: below 80. Elevated  First number: 120-129.  Second number: below 80. Hypertension stage 1  First number: 130-139.  Second number: 80-89. Hypertension stage 2  First number: 140 or above.  Second number: 90 or above. Your blood pressure is above normal even if only the top or bottom number is above normal. Follow these instructions at home:  Check your blood pressure as often as your doctor tells you to.  Check your blood pressure at the same time every day.  Take your monitor to your next doctor's appointment. Your doctor will: ? Make sure you are using it correctly. ? Make sure it is working right.  Make sure you understand what your blood pressure numbers should be.  Tell your doctor if your medicine is causing side effects.  Keep all follow-up visits as told by your doctor. This is important. General tips:  You will need a blood pressure machine, or monitor. Your doctor can suggest a   monitor. You can buy one at a drugstore or online. When choosing one: ? Choose one with an arm cuff. ? Choose one that wraps around your upper arm. Only one finger should fit between your arm and the cuff. ? Do not choose one that measures your blood pressure from your wrist or finger. Where to find more information American Heart Association: www.heart.org Contact a doctor if:  Your blood pressure keeps being high. Get help right away if:  Your first blood pressure number is higher than 180.  Your second blood pressure number is higher than 120. Summary  Check your blood pressure at the same  time every day.  Avoid caffeine, alcohol, smoking, and exercise for 30 minutes before checking your blood pressure.  Make sure you understand what your blood pressure numbers should be. This information is not intended to replace advice given to you by your health care provider. Make sure you discuss any questions you have with your health care provider. Document Revised: 02/18/2019 Document Reviewed: 02/18/2019 Elsevier Patient Education  2021 [place]. Hypertension, Adult High blood pressure (hypertension) is when the force of blood pumping through the arteries is too strong. The arteries are the blood vessels that carry blood from the heart throughout the body. Hypertension forces the heart to work harder to pump blood and may cause arteries to become narrow or stiff. Untreated or uncontrolled hypertension can cause a heart attack, heart failure, a stroke, kidney disease, and other problems. A blood pressure reading consists of a higher number over a lower number. Ideally, your blood pressure should be below 120/80. The first ("top") number is called the systolic pressure. It is a measure of the pressure in your arteries as your heart beats. The second ("bottom") number is called the diastolic pressure. It is a measure of the pressure in your arteries as the heart relaxes. What are the causes? The exact cause of this condition is not known. There are some conditions that result in or are related to high blood pressure. What increases the risk? Some risk factors for high blood pressure are under your control. The following factors may make you more likely to develop this condition:  Smoking.  Having type 2 diabetes mellitus, high cholesterol, or both.  Not getting enough exercise or physical activity.  Being overweight.  Having too much fat, sugar, calories, or salt (sodium) in your diet.  Drinking too much alcohol. Some risk factors for high blood pressure may be difficult or  impossible to change. Some of these factors include:  Having chronic kidney disease.  Having a family history of high blood pressure.  Age. Risk increases with age.  Race. You may be at higher risk if you are African American.  Gender. Men are at higher risk than women before age 30. After age 45, women are at higher risk than men.  Having obstructive sleep apnea.  Stress. What are the signs or symptoms? High blood pressure may not cause symptoms. Very high blood pressure (hypertensive crisis) may cause:  Headache.  Anxiety.  Shortness of breath.  Nosebleed.  Nausea and vomiting.  Vision changes.  Severe chest pain.  Seizures. How is this diagnosed? This condition is diagnosed by measuring your blood pressure while you are seated, with your arm resting on a flat surface, your legs uncrossed, and your feet flat on the floor. The cuff of the blood pressure monitor will be placed directly against the skin of your upper arm at the level of your heart.  It should be measured at least twice using the same arm. Certain conditions can cause a difference in blood pressure between your right and left arms. Certain factors can cause blood pressure readings to be lower or higher than normal for a short period of time:  When your blood pressure is higher when you are in a health care provider's office than when you are at home, this is called white coat hypertension. Most people with this condition do not need medicines.  When your blood pressure is higher at home than when you are in a health care provider's office, this is called masked hypertension. Most people with this condition may need medicines to control blood pressure. If you have a high blood pressure reading during one visit or you have normal blood pressure with other risk factors, you may be asked to:  Return on a different day to have your blood pressure checked again.  Monitor your blood pressure at home for 1 week or  longer. If you are diagnosed with hypertension, you may have other blood or imaging tests to help your health care provider understand your overall risk for other conditions. How is this treated? This condition is treated by making healthy lifestyle changes, such as eating healthy foods, exercising more, and reducing your alcohol intake. Your health care provider may prescribe medicine if lifestyle changes are not enough to get your blood pressure under control, and if:  Your systolic blood pressure is above 130.  Your diastolic blood pressure is above 80. Your personal target blood pressure may vary depending on your medical conditions, your age, and other factors. Follow these instructions at home: Eating and drinking  Eat a diet that is high in fiber and potassium, and low in sodium, added sugar, and fat. An example eating plan is called the DASH (Dietary Approaches to Stop Hypertension) diet. To eat this way: ? Eat plenty of fresh fruits and vegetables. Try to fill one half of your plate at each meal with fruits and vegetables. ? Eat whole grains, such as whole-wheat pasta, brown rice, or whole-grain bread. Fill about one fourth of your plate with whole grains. ? Eat or drink low-fat dairy products, such as skim milk or low-fat yogurt. ? Avoid fatty cuts of meat, processed or cured meats, and poultry with skin. Fill about one fourth of your plate with lean proteins, such as fish, chicken without skin, beans, eggs, or tofu. ? Avoid pre-made and processed foods. These tend to be higher in sodium, added sugar, and fat.  Reduce your daily sodium intake. Most people with hypertension should eat less than 1,500 mg of sodium a day.  Do not drink alcohol if: ? Your health care provider tells you not to drink. ? You are pregnant, may be pregnant, or are planning to become pregnant.  If you drink alcohol: ? Limit how much you use to:  0-1 drink a day for women.  0-2 drinks a day for  men. ? Be aware of how much alcohol is in your drink. In the [place] one drink equals one 12 oz bottle of beer (355 mL), one 5 oz glass of wine (148 mL), or one 1 oz glass of hard liquor (44 mL).   Lifestyle  Work with your health care provider to maintain a healthy body weight or to lose weight. Ask what an ideal weight is for you.  Get at least 30 minutes of exercise most days of the week. Activities may include walking, swimming, or  biking.  Include exercise to strengthen your muscles (resistance exercise), such as Pilates or lifting weights, as part of your weekly exercise routine. Try to do these types of exercises for 30 minutes at least 3 days a week.  Do not use any products that contain nicotine or tobacco, such as cigarettes, e-cigarettes, and chewing tobacco. If you need help quitting, ask your health care provider.  Monitor your blood pressure at home as told by your health care provider.  Keep all follow-up visits as told by your health care provider. This is important.   Medicines  Take over-the-counter and prescription medicines only as told by your health care provider. Follow directions carefully. Blood pressure medicines must be taken as prescribed.  Do not skip doses of blood pressure medicine. Doing this puts you at risk for problems and can make the medicine less effective.  Ask your health care provider about side effects or reactions to medicines that you should watch for. Contact a health care provider if you:  Think you are having a reaction to a medicine you are taking.  Have headaches that keep coming back (recurring).  Feel dizzy.  Have swelling in your ankles.  Have trouble with your vision. Get help right away if you:  Develop a severe headache or confusion.  Have unusual weakness or numbness.  Feel faint.  Have severe pain in your chest or abdomen.  Vomit repeatedly.  Have trouble breathing. Summary  Hypertension is when the force of blood  pumping through your arteries is too strong. If this condition is not controlled, it may put you at risk for serious complications.  Your personal target blood pressure may vary depending on your medical conditions, your age, and other factors. For most people, a normal blood pressure is less than 120/80.  Hypertension is treated with lifestyle changes, medicines, or a combination of both. Lifestyle changes include losing weight, eating a healthy, low-sodium diet, exercising more, and limiting alcohol. This information is not intended to replace advice given to you by your health care provider. Make sure you discuss any questions you have with your health care provider. Document Revised: 11/04/2017 Document Reviewed: 11/04/2017 Elsevier Patient Education  2021 [place]. How to Take Your Blood Pressure Blood pressure measures how strongly your blood is pressing against the walls of your arteries. Arteries are blood vessels that carry blood from your heart throughout your body. You can take your blood pressure at home with a machine. You may need to check your blood pressure at home:  To check if you have high blood pressure (hypertension).  To check your blood pressure over time.  To make sure your blood pressure medicine is working. Supplies needed:  Blood pressure machine, or monitor.  Dining room chair to sit in.  Table or desk.  Small notebook.  Pencil or pen. How to prepare Avoid these things for 30 minutes before checking your blood pressure:  Having drinks with caffeine in them, such as coffee or tea.  Drinking alcohol.  Eating.  Smoking.  Exercising. Do these things five minutes before checking your blood pressure:  Go to the bathroom and pee (urinate).  Sit in a dining chair. Do not sit in a soft couch or an armchair.  Be quiet. Do not talk. How to take your blood pressure Follow the instructions that came with your machine. If you have a digital blood  pressure monitor, these may be the instructions: 12. Sit up straight. 13. Place your feet on the  floor. Do not cross your ankles or legs. 14. Rest your left arm at the level of your heart. You may rest it on a table, desk, or chair. 15. Pull up your shirt sleeve. 16. Wrap the blood pressure cuff around the upper part of your left arm. The cuff should be 1 inch (2.5 cm) above your elbow. It is best to wrap the cuff around bare skin. 17. Fit the cuff snugly around your arm. You should be able to place only one finger between the cuff and your arm. 18. Place the cord so that it rests in the bend of your elbow. 19. Press the power button. 20. Sit quietly while the cuff fills with air and loses air. 21. Write down the numbers on the screen. 22. Wait 2-3 minutes and then repeat steps 1-10.   What do the numbers mean? Two numbers make up your blood pressure. The first number is called systolic pressure. The second is called diastolic pressure. An example of a blood pressure reading is "120 over 80" (or 120/80). If you are an adult and do not have a medical condition, use this guide to find out if your blood pressure is normal: Normal  First number: below 120.  Second number: below 80. Elevated  First number: 120-129.  Second number: below 80. Hypertension stage 1  First number: 130-139.  Second number: 80-89. Hypertension stage 2  First number: 140 or above.  Second number: 70 or above. Your blood pressure is above normal even if only the top or bottom number is above normal. Follow these instructions at home:  Check your blood pressure as often as your doctor tells you to.  Check your blood pressure at the same time every day.  Take your monitor to your next doctor's appointment. Your doctor will: ? Make sure you are using it correctly. ? Make sure it is working right.  Make sure you understand what your blood pressure numbers should be.  Tell your doctor if your medicine is  causing side effects.  Keep all follow-up visits as told by your doctor. This is important. General tips:  You will need a blood pressure machine, or monitor. Your doctor can suggest a monitor. You can buy one at a drugstore or online. When choosing one: ? Choose one with an arm cuff. ? Choose one that wraps around your upper arm. Only one finger should fit between your arm and the cuff. ? Do not choose one that measures your blood pressure from your wrist or finger. Where to find more information American Heart Association: www.heart.org Contact a doctor if:  Your blood pressure keeps being high. Get help right away if:  Your first blood pressure number is higher than 180.  Your second blood pressure number is higher than 120. Summary  Check your blood pressure at the same time every day.  Avoid caffeine, alcohol, smoking, and exercise for 30 minutes before checking your blood pressure.  Make sure you understand what your blood pressure numbers should be. This information is not intended to replace advice given to you by your health care provider. Make sure you discuss any questions you have with your health care provider. Document Revised: 02/18/2019 Document Reviewed: 02/18/2019 Elsevier Patient Education  2021 [employer].

## 2020-07-12 NOTE — Telephone Encounter (Signed)
Called and set up appt

## 2020-07-12 NOTE — Progress Notes (Signed)
Date:  07/12/2020   Name:  Marcus Delacruz   DOB:  [birth year]   MRN:  [id number]   Chief Complaint: Hypertension (X1 day ago had high reading at dentist )  Hypertension Associated symptoms include blurred vision. Pertinent negatives include no chest pain, headaches, neck pain, palpitations or shortness of breath.  Headache  This is a chronic problem. The current episode started in the past 7 days. The problem occurs intermittently. The pain is located in the vertex region. The pain does not radiate. The quality of the pain is described as aching. The pain is at a severity of 4/10. The pain is mild. Associated symptoms include blurred vision and a visual change. Pertinent negatives include no abdominal pain, back pain, coughing, dizziness, drainage, ear pain, eye pain, eye redness, eye watering, facial sweating, fever, hearing loss, insomnia, loss of balance, muscle aches, nausea, neck pain, numbness, phonophobia, photophobia, rhinorrhea, scalp tenderness, sore throat, tingling, tinnitus, vomiting or weakness. He has tried NSAIDs for the symptoms. The treatment provided no relief. His past medical history is significant for hypertension.    Lab Results  Component Value Date   CREATININE 1.38 (H) 02/20/2020   BUN 23 02/20/2020   NA 142 02/20/2020   K 4.2 02/20/2020   CL 99 02/20/2020   CO2 29 02/20/2020   Lab Results  Component Value Date   CHOL 175 09/06/2019   HDL 61 09/06/2019   LDLCALC 96 09/06/2019   TRIG 100 09/06/2019   CHOLHDL 3.5 08/20/2017   No results found for: TSH No results found for: HGBA1C Lab Results  Component Value Date   WBC 8.2 08/08/2013   HGB 15.7 07/10/2014   HCT 43.3 08/08/2013   MCV 86.7 08/08/2013   PLT 354.0 08/08/2013   Lab Results  Component Value Date   ALT 30 02/20/2020   AST 61 (H) 02/20/2020   ALKPHOS 77 02/20/2020   BILITOT 0.5 02/20/2020     Review of Systems  Constitutional: Negative for chills and fever.  HENT: Negative for  drooling, ear discharge, ear pain, hearing loss, rhinorrhea, sore throat and tinnitus.   Eyes: Positive for blurred vision. Negative for photophobia, pain and redness.  Respiratory: Negative for cough, shortness of breath and wheezing.   Cardiovascular: Negative for chest pain, palpitations and leg swelling.  Gastrointestinal: Negative for abdominal pain, blood in stool, constipation, diarrhea, nausea and vomiting.  Endocrine: Negative for polydipsia.  Genitourinary: Negative for dysuria, frequency, hematuria and urgency.  Musculoskeletal: Negative for back pain, myalgias and neck pain.  Skin: Negative for rash.  Allergic/Immunologic: Negative for environmental allergies.  Neurological: Negative for dizziness, tingling, weakness, numbness, headaches and loss of balance.  Hematological: Does not bruise/bleed easily.  Psychiatric/Behavioral: Negative for suicidal ideas. The patient is not nervous/anxious and does not have insomnia.     Patient Active Problem List   Diagnosis Date Noted  . Chronic obstructive pulmonary disease with acute exacerbation ([place]) 07/27/2017  . Asthma, chronic, severe persistent, uncomplicated 85/27/7824  . Seasonal allergic rhinitis due to pollen 08/18/2016  . Bilateral carotid artery stenosis 04/03/2016  . Bilateral hydrocele 11/22/2014  . BPH with obstruction/lower urinary tract symptoms 10/05/2014  . Epididymal cyst 10/05/2014  . Familial multiple lipoprotein-type hyperlipidemia 07/14/2014  . Laboratory animal allergy 07/14/2014  . Acute arthropathy 07/14/2014  . Routine general medical examination at a health care facility 07/14/2014  . Asthma with exacerbation 07/14/2014  . Essential (primary) hypertension 07/14/2014  . Screening for depression 07/14/2014  . Noncompliance w/medication  treatment due to intermit use of medication 11/23/2013  . Asthma, chronic 08/08/2013    Allergies  Allergen Reactions  . Codeine Itching    Past Surgical History:   Procedure Laterality Date  . ARTERY REPAIR    . BACK SURGERY    . CHOLECYSTECTOMY    . COLONOSCOPY  2013   cleared  . HERNIA REPAIR      Social History   Tobacco Use  . Smoking status: Former Smoker    Packs/day: 1.00    Years: 20.00    Pack years: 20.00    Types: Cigarettes    Quit date: 08/09/1983    Years since quitting: 36.9  . Smokeless tobacco: Never Used  . Tobacco comment: quit smoking over 40 years ago  Vaping Use  . Vaping Use: Never used  Substance Use Topics  . Alcohol use: Yes    Alcohol/week: 0.0 standard drinks    Comment: once monthly  . Drug use: No     Medication list has been reviewed and updated.  Current Meds  Medication Sig  . albuterol (VENTOLIN HFA) 108 (90 Base) MCG/ACT inhaler USE 2 PUFFS EVERY 6 HOURS AS NEEDED SHORTNESS OF BREATH OR WHEEZING  . ALLERGY RELIEF 180 MG tablet TAKE (1) TABLET BY MOUTH EVERY DAY  . aspirin 81 MG tablet Take 81 mg by mouth daily.  Marland Kitchen atorvastatin (LIPITOR) 40 MG tablet Take 1 tablet daily  . carbamide peroxide (DEBROX) 6.5 % OTIC solution Place 5 drops into the right ear 2 (two) times daily.  . finasteride (PROSCAR) 5 MG tablet TAKE (1) TABLET BY MOUTH EVERY DAY  . Fluticasone-Umeclidin-Vilant (TRELEGY ELLIPTA) 100-62.5-25 MCG/INH AEPB Inhale 1 puff into the lungs daily.  Marland Kitchen ibuprofen (ADVIL) 800 MG tablet Take 1 tablet up to three times daily as needed for arthritis pain  . KETOCONAZOLE-HYDROCORTISONE EX Apply topically.  Marland Kitchen lisinopril-hydrochlorothiazide (ZESTORETIC) 10-12.5 MG tablet TAKE (1) TABLET BY MOUTH EVERY DAY  . montelukast (SINGULAIR) 10 MG tablet TAKE (1) TABLET BY MOUTH EVERY DAY   Current Facility-Administered Medications for the 07/12/20 encounter (Office Visit) with Juline Patch, MD  Medication  . ipratropium-albuterol (DUONEB) 0.5-2.5 (3) MG/3ML nebulizer solution 3 mL  . ipratropium-albuterol (DUONEB) 0.5-2.5 (3) MG/3ML nebulizer solution 3 mL    PHQ 2/9 Scores 07/12/2020 02/20/2020 09/06/2019  02/23/2019  PHQ - 2 Score 0 0 0 0  PHQ- 9 Score 0 - 0 0    GAD 7 : Generalized Anxiety Score 07/12/2020 09/06/2019 02/23/2019  Nervous, Anxious, on Edge 1 0 0  Control/stop worrying 0 0 0  Worry too much - different things 0 0 0  Trouble relaxing 0 0 0  Restless 0 0 0  Easily annoyed or irritable 0 0 0  Afraid - awful might happen 0 0 0  Total GAD 7 Score 1 0 0    BP Readings from Last 3 Encounters:  07/12/20 (!) 162/70  03/05/20 138/64  02/20/20 110/60    Physical Exam Vitals and nursing note reviewed.  HENT:     Head: Normocephalic.     Right Ear: Tympanic membrane, ear canal and external ear normal. There is no impacted cerumen.     Left Ear: Tympanic membrane, ear canal and external ear normal. There is no impacted cerumen.     Nose: Nose normal. No congestion.     Mouth/Throat:     Mouth: Mucous membranes are moist.  Eyes:     General: No scleral icterus.  Right eye: No discharge.        Left eye: No discharge.     Conjunctiva/sclera: Conjunctivae normal.     Pupils: Pupils are equal, round, and reactive to light.  Neck:     Thyroid: No thyromegaly.     Vascular: No JVD.     Trachea: No tracheal deviation.  Cardiovascular:     Rate and Rhythm: Normal rate and regular rhythm.     Heart sounds: Normal heart sounds. No murmur heard. No friction rub. No gallop.   Pulmonary:     Effort: No respiratory distress.     Breath sounds: Normal breath sounds. No wheezing, rhonchi or rales.  Abdominal:     General: Bowel sounds are normal.     Palpations: Abdomen is soft. There is no mass.     Tenderness: There is no abdominal tenderness. There is no guarding or rebound.  Musculoskeletal:        General: No tenderness. Normal range of motion.     Cervical back: Normal range of motion and neck supple.  Lymphadenopathy:     Cervical: No cervical adenopathy.  Skin:    General: Skin is warm.     Findings: No rash.  Neurological:     Mental Status: He is alert and  oriented to person, place, and time.     Cranial Nerves: No cranial nerve deficit.     Deep Tendon Reflexes: Reflexes are normal and symmetric.     Wt Readings from Last 3 Encounters:  07/12/20 177 lb (80.3 kg)  03/05/20 182 lb 6.4 oz (82.7 kg)  02/20/20 179 lb (81.2 kg)    BP (!) 162/70   Pulse 62   Ht 5\' 10"  (1.778 m)   Wt 177 lb (80.3 kg)   BMI 25.40 kg/m   Assessment and Plan:  1. New daily persistent headache New onset.  Episodic.  Daily.  Patient has a new onset of a headache worsening in the left vertex area.  He is particularly in noticeable in the mornings but this is almost 23 hours from his caffeine from day previous.  Given this is new onset and with the new onset of blood pressure he has concerns that they may be related.  We will refer to neurology for evaluation in the meantime we will be making adjustments on his medications for hypertension. - Ambulatory referral to Neurology  2. Essential (primary) hypertension Chronic.  Controlled.  Stable.  Patient has had waxing and waning blood particularly when he went to have it tooth pulled it was noted that his blood pressure was elevated at the dentist.  We went over his blood pressure readings over the past 2 years and there has been some waxing and waning for the but for the most part they have been within reasonable control.  We will increase his hydrochlorothiazide lisinopril combination 10-12 0.5 but do so in a twice a day dosing so that we are maintaining a true 24-hour bioavailability of medication when he checks his blood pressure.  Patient is also been given the proper way to check blood pressure with the things to avoid the things to do prior and positioning that it is meaningful.  Patient will bring these readings and will recheck blood pressure in 2-3 weeks. - lisinopril-hydrochlorothiazide (ZESTORETIC) 10-12.5 MG tablet; One twice a day  Dispense: 180 tablet; Refill: 1

## 2020-07-13 ENCOUNTER — Ambulatory Visit (INDEPENDENT_AMBULATORY_CARE_PROVIDER_SITE_OTHER): Payer: Medicare Other

## 2020-07-13 ENCOUNTER — Ambulatory Visit (INDEPENDENT_AMBULATORY_CARE_PROVIDER_SITE_OTHER): Payer: Medicare Other | Admitting: Nurse Practitioner

## 2020-07-13 ENCOUNTER — Encounter (INDEPENDENT_AMBULATORY_CARE_PROVIDER_SITE_OTHER): Payer: Self-pay | Admitting: Nurse Practitioner

## 2020-07-13 VITALS — BP 147/78 | HR 66 | Resp 16 | Wt 176.8 lb

## 2020-07-13 DIAGNOSIS — I10 Essential (primary) hypertension: Secondary | ICD-10-CM | POA: Diagnosis not present

## 2020-07-13 DIAGNOSIS — I6523 Occlusion and stenosis of bilateral carotid arteries: Secondary | ICD-10-CM

## 2020-07-13 NOTE — Progress Notes (Signed)
Subjective:    Patient ID: Marcus Delacruz, male    DOB: [birth year], 80 y.o.   MRN: [id number] Chief Complaint  Patient presents with  . Follow-up    Ultrasound follow up    The patient is seen for follow up evaluation of carotid stenosis. The carotid stenosis followed by ultrasound.  The patient had an incident recently after a dental appointment or he had an elevated blood pressure.  Following his he had blurred vision and headache.  However now that his blood pressure medications have been adjusted there are no issues with vision or, weakness or headaches.  The patient denies amaurosis fugax. There is no recent history of TIA symptoms or focal motor deficits. There is no prior documented CVA.  The patient is taking enteric-coated aspirin 81 mg daily.  There is no history of migraine headaches. There is no history of seizures.  The patient has a history of coronary artery disease, no recent episodes of angina or shortness of breath. The patient denies PAD or claudication symptoms. There is a history of hyperlipidemia which is being treated with a statin.    Duplex ultrasound shows 40 to 59% stenosis of the right carotid artery with 60 to 79% stenosis of the left ICA.  These are increased from previous velocities.    Review of Systems  Eyes: Negative for visual disturbance.  Neurological: Negative for headaches.  All other systems reviewed and are negative.      Objective:   Physical Exam Vitals reviewed.  HENT:     Head: Normocephalic.  Cardiovascular:     Rate and Rhythm: Normal rate.     Pulses: Normal pulses.  Pulmonary:     Effort: Pulmonary effort is normal.  Skin:    General: Skin is warm and dry.  Neurological:     Mental Status: He is alert and oriented to person, place, and time.  Psychiatric:        Mood and Affect: Mood normal.        Behavior: Behavior normal.        Thought Content: Thought content normal.        Judgment: Judgment normal.      BP (!) 147/78 (BP Location: Right Arm)   Pulse 66   Resp 16   Wt 176 lb 12.8 oz (80.2 kg)   BMI 25.37 kg/m   Past Medical History:  Diagnosis Date  . Allergic rhinitis due to pollen   . Asthma   . BPH (benign prostatic hyperplasia)   . Cardiomegaly   . Chronic obstructive pulmonary disease (COPD) ([place])   . Hypercholesteremia   . Hypersomnia, unspecified   . Hypertension   . Obstructive sleep apnea (adult) (pediatric)   . Shortness of breath   . Snoring   . Solitary pulmonary nodule     Social History   Socioeconomic History  . Marital status: Married    Spouse name: Not on file  . Number of children: 3  . Years of education: Not on file  . Highest education level: Associate degree: academic program  Occupational History  . Occupation: retired  Tobacco Use  . Smoking status: Former Smoker    Packs/day: 1.00    Years: 20.00    Pack years: 20.00    Types: Cigarettes    Quit date: 08/09/1983    Years since quitting: 36.9  . Smokeless tobacco: Never Used  . Tobacco comment: quit smoking over 40 years ago  Vaping Use  . Vaping  Use: Never used  Substance and Sexual Activity  . Alcohol use: Yes    Alcohol/week: 0.0 standard drinks    Comment: once monthly  . Drug use: No  . Sexual activity: Not on file  Other Topics Concern  . Not on file  Social History Narrative  . Not on file   Social Determinants of Health   Financial Resource Strain: Low Risk   . Difficulty of Paying Living Expenses: Not hard at all  Food Insecurity: No Food Insecurity  . Worried About Charity fundraiser in the Last Year: Never true  . Ran Out of Food in the Last Year: Never true  Transportation Needs: No Transportation Needs  . Lack of Transportation (Medical): No  . Lack of Transportation (Non-Medical): No  Physical Activity: Inactive  . Days of Exercise per Week: 0 days  . Minutes of Exercise per Session: 0 min  Stress: No Stress Concern Present  . Feeling of Stress : Not  at all  Social Connections: Moderately Integrated  . Frequency of Communication with Friends and Family: More than three times a week  . Frequency of Social Gatherings with Friends and Family: Not on file  . Attends Religious Services: More than 4 times per year  . Active Member of Clubs or Organizations: No  . Attends Archivist Meetings: Never  . Marital Status: Married  Human resources officer Violence: Not At Risk  . Fear of Current or Ex-Partner: No  . Emotionally Abused: No  . Physically Abused: No  . Sexually Abused: No    Past Surgical History:  Procedure Laterality Date  . ARTERY REPAIR    . BACK SURGERY    . CHOLECYSTECTOMY    . COLONOSCOPY  2013   cleared  . HERNIA REPAIR      Family History  Problem Relation Age of Onset  . COPD Mother   . Asthma Son   . Cancer Father        lung  . Prostate cancer Neg Hx   . Kidney disease Neg Hx     Allergies  Allergen Reactions  . Codeine Itching    CBC Latest Ref Rng & Units 07/10/2014 08/08/2013  WBC 4.0 - 10.5 K/uL - 8.2  Hemoglobin 13.5 - 17.5 g/dL 15.7 14.7  Hematocrit 39.0 - 52.0 % - 43.3  Platelets 150.0 - 400.0 K/uL - 354.0      CMP     Component Value Date/Time   NA 142 02/20/2020 1639   K 4.2 02/20/2020 1639   CL 99 02/20/2020 1639   CO2 29 02/20/2020 1639   GLUCOSE 99 02/20/2020 1639   BUN 23 02/20/2020 1639   CREATININE 1.38 (H) 02/20/2020 1639   CALCIUM 9.6 02/20/2020 1639   PROT 7.2 02/20/2020 1639   ALBUMIN 4.5 02/20/2020 1639   AST 61 (H) 02/20/2020 1639   ALT 30 02/20/2020 1639   ALKPHOS 77 02/20/2020 1639   BILITOT 0.5 02/20/2020 1639   GFRNONAA 49 (L) 02/20/2020 1639   GFRAA 56 (L) 02/20/2020 1639     No results found.     Assessment & Plan:   1. Bilateral carotid artery stenosis Recommend:  Given the patient's asymptomatic subcritical stenosis no further invasive testing or surgery at this time.  Duplex ultrasound shows 40 to 59% stenosis of the right carotid artery  with 60 to 79% stenosis of the left ICA.  Continue antiplatelet therapy as prescribed Continue management of CAD, HTN and Hyperlipidemia Healthy heart diet,  encouraged exercise at least 4 times per week Follow up in 6 months with duplex ultrasound and physical exam   2. Essential (primary) hypertension Continue antihypertensive medications as already ordered, these medications have been reviewed and there are no changes at this time.    Current Outpatient Medications on File Prior to Visit  Medication Sig Dispense Refill  . albuterol (VENTOLIN HFA) 108 (90 Base) MCG/ACT inhaler USE 2 PUFFS EVERY 6 HOURS AS NEEDED SHORTNESS OF BREATH OR WHEEZING 18 g 1  . ALLERGY RELIEF 180 MG tablet TAKE (1) TABLET BY MOUTH EVERY DAY 90 tablet 1  . aspirin 81 MG tablet Take 81 mg by mouth daily.    Marland Kitchen atorvastatin (LIPITOR) 40 MG tablet Take 1 tablet daily 90 tablet 1  . carbamide peroxide (DEBROX) 6.5 % OTIC solution Place 5 drops into the right ear 2 (two) times daily. 15 mL 0  . finasteride (PROSCAR) 5 MG tablet TAKE (1) TABLET BY MOUTH EVERY DAY 90 tablet 3  . Fluticasone-Umeclidin-Vilant (TRELEGY ELLIPTA) 100-62.5-25 MCG/INH AEPB Inhale 1 puff into the lungs daily. 180 each 2  . ibuprofen (ADVIL) 800 MG tablet Take 1 tablet up to three times daily as needed for arthritis pain 90 tablet 1  . KETOCONAZOLE-HYDROCORTISONE EX Apply topically.    Marland Kitchen lisinopril-hydrochlorothiazide (ZESTORETIC) 10-12.5 MG tablet One twice a day 180 tablet 1  . montelukast (SINGULAIR) 10 MG tablet TAKE (1) TABLET BY MOUTH EVERY DAY 90 tablet 1  . NON FORMULARY cpap device (Patient not taking: No sig reported)     Current Facility-Administered Medications on File Prior to Visit  Medication Dose Route Frequency Provider Last Rate Last Admin  . ipratropium-albuterol (DUONEB) 0.5-2.5 (3) MG/3ML nebulizer solution 3 mL  3 mL Nebulization Once Jones, Deanna C, MD      . ipratropium-albuterol (DUONEB) 0.5-2.5 (3) MG/3ML nebulizer  solution 3 mL  3 mL Nebulization Once Juline Patch, MD        There are no Patient Instructions on file for this visit. No follow-ups on file.   Kris Hartmann, NP

## 2020-07-18 ENCOUNTER — Other Ambulatory Visit: Payer: Self-pay

## 2020-07-18 ENCOUNTER — Ambulatory Visit: Payer: Medicare Other

## 2020-07-18 DIAGNOSIS — G4733 Obstructive sleep apnea (adult) (pediatric): Secondary | ICD-10-CM | POA: Diagnosis not present

## 2020-07-18 NOTE — Progress Notes (Signed)
95 percentile pressure 11   95th percentile leak 12.8   apnea index 1.0 /hr  apnea-hypopnea index  1.8 /hr   total days used  >4 hr 90 days  total days used <4 hr 0 days  Total compliance 100 percent  He is doing great no problems or questions at this time.

## 2020-08-09 ENCOUNTER — Ambulatory Visit: Payer: Medicare PPO | Admitting: Family Medicine

## 2020-08-13 ENCOUNTER — Telehealth: Payer: Self-pay

## 2020-08-13 ENCOUNTER — Other Ambulatory Visit: Payer: Self-pay | Admitting: Adult Health

## 2020-08-13 DIAGNOSIS — J449 Chronic obstructive pulmonary disease, unspecified: Secondary | ICD-10-CM

## 2020-08-13 NOTE — Telephone Encounter (Signed)
Lvm for patient to arrive @ 11:20  for 08/15/20 appointment-Toni

## 2020-08-14 ENCOUNTER — Encounter: Payer: Self-pay | Admitting: Family Medicine

## 2020-08-14 ENCOUNTER — Ambulatory Visit: Payer: Medicare Other | Admitting: Family Medicine

## 2020-08-14 ENCOUNTER — Other Ambulatory Visit: Payer: Self-pay

## 2020-08-14 VITALS — BP 130/72 | HR 60 | Ht 70.0 in | Wt 177.0 lb

## 2020-08-14 DIAGNOSIS — R7401 Elevation of levels of liver transaminase levels: Secondary | ICD-10-CM

## 2020-08-14 DIAGNOSIS — I10 Essential (primary) hypertension: Secondary | ICD-10-CM

## 2020-08-14 DIAGNOSIS — E7849 Other hyperlipidemia: Secondary | ICD-10-CM

## 2020-08-14 NOTE — Progress Notes (Signed)
Date:  08/14/2020   Name:  Marcus Delacruz   DOB:  December 23, 1940   MRN:  [id number]   Chief Complaint: Hypertension (Rechecking b/p) and elevated labs (Repeat cmp due to liver enzymes and creat elevated)  Hypertension This is a chronic problem. The current episode started more than 1 year ago. The problem has been gradually improving since onset. The problem is controlled. Pertinent negatives include no anxiety, blurred vision, chest pain, headaches, malaise/fatigue, neck pain, orthopnea, palpitations, peripheral edema, PND, shortness of breath or sweats. Risk factors for coronary artery disease include dyslipidemia.    Lab Results  Component Value Date   CREATININE 1.38 (H) 02/20/2020   BUN 23 02/20/2020   NA 142 02/20/2020   K 4.2 02/20/2020   CL 99 02/20/2020   CO2 29 02/20/2020   Lab Results  Component Value Date   CHOL 175 09/06/2019   HDL 61 09/06/2019   LDLCALC 96 09/06/2019   TRIG 100 09/06/2019   CHOLHDL 3.5 08/20/2017   No results found for: TSH No results found for: HGBA1C Lab Results  Component Value Date   WBC 8.2 08/08/2013   HGB 15.7 07/10/2014   HCT 43.3 08/08/2013   MCV 86.7 08/08/2013   PLT 354.0 08/08/2013   Lab Results  Component Value Date   ALT 30 02/20/2020   AST 61 (H) 02/20/2020   ALKPHOS 77 02/20/2020   BILITOT 0.5 02/20/2020     Review of Systems  Constitutional: Negative for chills, fever and malaise/fatigue.  HENT: Negative for drooling, ear discharge, ear pain and sore throat.   Eyes: Negative for blurred vision.  Respiratory: Negative for cough, shortness of breath and wheezing.   Cardiovascular: Negative for chest pain, palpitations, orthopnea, leg swelling and PND.  Gastrointestinal: Negative for abdominal pain, blood in stool, constipation, diarrhea and nausea.  Endocrine: Negative for polydipsia.  Genitourinary: Negative for dysuria, frequency, hematuria and urgency.  Musculoskeletal: Negative for back pain, myalgias and  neck pain.  Skin: Negative for rash.  Allergic/Immunologic: Negative for environmental allergies.  Neurological: Negative for dizziness and headaches.  Hematological: Does not bruise/bleed easily.  Psychiatric/Behavioral: Negative for suicidal ideas. The patient is not nervous/anxious.     Patient Active Problem List   Diagnosis Date Noted  . Chronic obstructive pulmonary disease with acute exacerbation ([place]) 07/27/2017  . Asthma, chronic, severe persistent, uncomplicated 76/54/6503  . Seasonal allergic rhinitis due to pollen 08/18/2016  . Bilateral carotid artery stenosis 04/03/2016  . Bilateral hydrocele 11/22/2014  . BPH with obstruction/lower urinary tract symptoms 10/05/2014  . Epididymal cyst 10/05/2014  . Familial multiple lipoprotein-type hyperlipidemia 07/14/2014  . Laboratory animal allergy 07/14/2014  . Acute arthropathy 07/14/2014  . Routine general medical examination at a health care facility 07/14/2014  . Asthma with exacerbation 07/14/2014  . Essential (primary) hypertension 07/14/2014  . Screening for depression 07/14/2014  . Noncompliance w/medication treatment due to intermit use of medication 11/23/2013  . Asthma, chronic 08/08/2013    Allergies  Allergen Reactions  . Codeine Itching    Past Surgical History:  Procedure Laterality Date  . ARTERY REPAIR    . BACK SURGERY    . CHOLECYSTECTOMY    . COLONOSCOPY  2013   cleared  . HERNIA REPAIR      Social History   Tobacco Use  . Smoking status: Former Smoker    Packs/day: 1.00    Years: 20.00    Pack years: 20.00    Types: Cigarettes    Quit date:  08/09/1983    Years since quitting: 37.0  . Smokeless tobacco: Never Used  . Tobacco comment: quit smoking over 40 years ago  Vaping Use  . Vaping Use: Never used  Substance Use Topics  . Alcohol use: Yes    Alcohol/week: 0.0 standard drinks    Comment: once monthly  . Drug use: No     Medication list has been reviewed and updated.  Current  Meds  Medication Sig  . albuterol (VENTOLIN HFA) 108 (90 Base) MCG/ACT inhaler USE 2 PUFFS EVERY 6 HOURS AS NEEDED SHORTNESS OF BREATH OR WHEEZING  . ALLERGY RELIEF 180 MG tablet TAKE (1) TABLET BY MOUTH EVERY DAY  . aspirin 81 MG tablet Take 81 mg by mouth daily.  Marland Kitchen atorvastatin (LIPITOR) 40 MG tablet Take 1 tablet daily  . carbamide peroxide (DEBROX) 6.5 % OTIC solution Place 5 drops into the right ear 2 (two) times daily.  . finasteride (PROSCAR) 5 MG tablet TAKE (1) TABLET BY MOUTH EVERY DAY  . Fluticasone-Umeclidin-Vilant (TRELEGY ELLIPTA) 100-62.5-25 MCG/INH AEPB Inhale 1 puff into the lungs daily.  Marland Kitchen ibuprofen (ADVIL) 800 MG tablet Take 1 tablet up to three times daily as needed for arthritis pain  . KETOCONAZOLE-HYDROCORTISONE EX Apply topically.  Marland Kitchen lisinopril-hydrochlorothiazide (ZESTORETIC) 10-12.5 MG tablet One twice a day  . montelukast (SINGULAIR) 10 MG tablet TAKE (1) TABLET BY MOUTH EVERY DAY  . NON FORMULARY cpap device   Current Facility-Administered Medications for the 08/14/20 encounter (Office Visit) with Juline Patch, MD  Medication  . ipratropium-albuterol (DUONEB) 0.5-2.5 (3) MG/3ML nebulizer solution 3 mL  . ipratropium-albuterol (DUONEB) 0.5-2.5 (3) MG/3ML nebulizer solution 3 mL    PHQ 2/9 Scores 07/12/2020 02/20/2020 09/06/2019 02/23/2019  PHQ - 2 Score 0 0 0 0  PHQ- 9 Score 0 - 0 0    GAD 7 : Generalized Anxiety Score 07/12/2020 09/06/2019 02/23/2019  Nervous, Anxious, on Edge 1 0 0  Control/stop worrying 0 0 0  Worry too much - different things 0 0 0  Trouble relaxing 0 0 0  Restless 0 0 0  Easily annoyed or irritable 0 0 0  Afraid - awful might happen 0 0 0  Total GAD 7 Score 1 0 0    BP Readings from Last 3 Encounters:  08/14/20 130/72  07/13/20 (!) 147/78  07/12/20 (!) 162/70    Physical Exam Vitals and nursing note reviewed.  HENT:     Head: Normocephalic.     Right Ear: Tympanic membrane, ear canal and external ear normal.     Left Ear:  Tympanic membrane, ear canal and external ear normal.     Nose: Nose normal. No congestion or rhinorrhea.     Mouth/Throat:     Mouth: Mucous membranes are moist.  Eyes:     General: No scleral icterus.       Right eye: No discharge.        Left eye: No discharge.     Conjunctiva/sclera: Conjunctivae normal.     Pupils: Pupils are equal, round, and reactive to light.  Neck:     Thyroid: No thyromegaly.     Vascular: No JVD.     Trachea: No tracheal deviation.  Cardiovascular:     Rate and Rhythm: Normal rate and regular rhythm.     Heart sounds: Normal heart sounds. No murmur heard. No friction rub. No gallop.   Pulmonary:     Effort: No respiratory distress.     Breath sounds: Normal breath  sounds. No wheezing, rhonchi or rales.  Abdominal:     General: Bowel sounds are normal.     Palpations: Abdomen is soft. There is no mass.     Tenderness: There is no abdominal tenderness. There is no guarding or rebound.  Musculoskeletal:        General: No tenderness. Normal range of motion.     Cervical back: Normal range of motion and neck supple.  Lymphadenopathy:     Cervical: No cervical adenopathy.  Skin:    General: Skin is warm.     Findings: No rash.  Neurological:     Mental Status: He is alert and oriented to person, place, and time.     Cranial Nerves: No cranial nerve deficit.     Deep Tendon Reflexes: Reflexes are normal and symmetric.     Wt Readings from Last 3 Encounters:  08/14/20 177 lb (80.3 kg)  07/13/20 176 lb 12.8 oz (80.2 kg)  07/12/20 177 lb (80.3 kg)    BP 130/72   Pulse 60   Ht 5\' 10"  (1.778 m)   Wt 177 lb (80.3 kg)   BMI 25.40 kg/m   Assessment and Plan: 1. Essential (primary) hypertension Chronic.  Controlled.  Stable.  Blood pressure is 130/72.  Mucous membranes, decreased moisture.  We will recheck a CMP to see if creatinine has improved and GFR GFR has improved as well.  We will continue on his present wrap medical regimen lisinopril  hydrochlorothiazide 10-12 0.5. - Comprehensive Metabolic Panel (CMET)  2. Elevated AST (SGOT) Chronic.  Episodic.  Is been elevated but only the AST and not the ALT.  We will check a CMP to see what the status of transaminases at this time. - Comprehensive Metabolic Panel (CMET)  3. Familial multiple lipoprotein-type hyperlipidemia Chronic.  Controlled.  Stable.  Will check lipid panel patient is definitely fasting but he probably is not hydrated. - Lipid Panel With LDL/HDL Ratio

## 2020-08-15 ENCOUNTER — Other Ambulatory Visit: Payer: Self-pay | Admitting: Family Medicine

## 2020-08-15 ENCOUNTER — Encounter: Payer: Self-pay | Admitting: Nurse Practitioner

## 2020-08-15 ENCOUNTER — Ambulatory Visit: Payer: Medicare Other | Admitting: Nurse Practitioner

## 2020-08-15 VITALS — BP 128/84 | HR 62 | Temp 98.2°F | Resp 16 | Ht 70.0 in | Wt 182.0 lb

## 2020-08-15 DIAGNOSIS — J449 Chronic obstructive pulmonary disease, unspecified: Secondary | ICD-10-CM

## 2020-08-15 DIAGNOSIS — Z9989 Dependence on other enabling machines and devices: Secondary | ICD-10-CM

## 2020-08-15 DIAGNOSIS — R7401 Elevation of levels of liver transaminase levels: Secondary | ICD-10-CM | POA: Diagnosis not present

## 2020-08-15 DIAGNOSIS — R0602 Shortness of breath: Secondary | ICD-10-CM

## 2020-08-15 DIAGNOSIS — I10 Essential (primary) hypertension: Secondary | ICD-10-CM | POA: Diagnosis not present

## 2020-08-15 DIAGNOSIS — G4733 Obstructive sleep apnea (adult) (pediatric): Secondary | ICD-10-CM | POA: Diagnosis not present

## 2020-08-15 DIAGNOSIS — E7849 Other hyperlipidemia: Secondary | ICD-10-CM | POA: Diagnosis not present

## 2020-08-15 NOTE — Telephone Encounter (Signed)
Requested medication (s) are due for refill today:   Yes  Requested medication (s) are on the active medication list:   yes  Future visit scheduled:   Yes   Last ordered: 09/05/2019 #180, 2 refills  Returned because filled by a historical provider and there is not a protocol assigned to this medication.   Requested Prescriptions  Pending Prescriptions Disp Refills   TRELEGY ELLIPTA 100-62.5-25 MCG/INH AEPB [Pharmacy Med Name: TRELEGY ELLIPTA 100-62.5-25 MCG/INH] 180 each 2    Sig: USE 1 PUFF ONCE DAILY AS DIRECTED BY PHYSICIAN      Off-Protocol Failed - 08/15/2020  2:21 PM      Failed - Medication not assigned to a protocol, review manually.      Passed - Valid encounter within last 12 months    Recent Outpatient Visits           Yesterday Essential (primary) hypertension   [facility] Juline Patch, MD   1 month ago New daily persistent headache   [facility] Juline Patch, MD   5 months ago Essential (primary) hypertension   [place], MD   11 months ago Essential (primary) hypertension   [facility] Juline Patch, MD   1 year ago Elevated AST (SGOT)   [facility] Juline Patch, MD       Future Appointments             In 4 months Juline Patch, MD [facility], [facility]

## 2020-08-15 NOTE — Progress Notes (Signed)
[facility] [place], [place] [address]  Internal MEDICINE  Office Visit Note  Patient Name: Marcus Delacruz  [id number]  [id number]  Date of Service: 08/19/2020  Chief Complaint  Patient presents with   Follow-up    PULM, med refill    HPI Marcus Delacruz presents for a routine follow-up visit to discuss CPAP, COPD and medication refills.  He has been using his CPAP at night and reports that his symptoms of sleep apnea have improved.  He has been using Trelegy once daily but reports that he loses his voice by the end of the day with it.  Patient is interested in trialing the respiratory inhaler in place of the Trelegy inhaler to see if he likes Breztri better.  He also wants to see if the [facility] inhaler does not take his voice away like Trelegy does.     Current Medication: Outpatient Encounter Medications as of 08/15/2020  Medication Sig Note   albuterol (VENTOLIN HFA) 108 (90 Base) MCG/ACT inhaler USE 2 PUFFS EVERY 6 HOURS AS NEEDED SHORTNESS OF BREATH OR WHEEZING    ALLERGY RELIEF 180 MG tablet TAKE (1) TABLET BY MOUTH EVERY DAY    aspirin 81 MG tablet Take 81 mg by mouth daily.    atorvastatin (LIPITOR) 40 MG tablet Take 1 tablet daily    finasteride (PROSCAR) 5 MG tablet TAKE (1) TABLET BY MOUTH EVERY DAY    Fluticasone-Umeclidin-Vilant (TRELEGY ELLIPTA) 100-62.5-25 MCG/INH AEPB Inhale 1 puff into the lungs daily.    ibuprofen (ADVIL) 800 MG tablet Take 1 tablet up to three times daily as needed for arthritis pain    lisinopril-hydrochlorothiazide (ZESTORETIC) 10-12.5 MG tablet One twice a day    montelukast (SINGULAIR) 10 MG tablet TAKE (1) TABLET BY MOUTH EVERY DAY    NON FORMULARY cpap device    carbamide peroxide (DEBROX) 6.5 % OTIC solution Place 5 drops into the right ear 2 (two) times daily. (Patient not taking: Reported on 08/15/2020) 02/20/2020: PRN only   KETOCONAZOLE-HYDROCORTISONE EX Apply topically. (Patient not taking: Reported on 08/15/2020)  02/20/2020: Dr. Dierdre Searles   Facility-Administered Encounter Medications as of 08/15/2020  Medication   ipratropium-albuterol (DUONEB) 0.5-2.5 (3) MG/3ML nebulizer solution 3 mL   ipratropium-albuterol (DUONEB) 0.5-2.5 (3) MG/3ML nebulizer solution 3 mL    Surgical History: Past Surgical History:  Procedure Laterality Date   ARTERY REPAIR     BACK SURGERY     CHOLECYSTECTOMY     COLONOSCOPY  2013   cleared   HERNIA REPAIR      Medical History: Past Medical History:  Diagnosis Date   Allergic rhinitis due to pollen    Asthma    BPH (benign prostatic hyperplasia)    Cardiomegaly    Chronic obstructive pulmonary disease (COPD) (HCC)    Hypercholesteremia    Hypersomnia, unspecified    Hypertension    Obstructive sleep apnea (adult) (pediatric)    Shortness of breath    Snoring    Solitary pulmonary nodule     Family History: Family History  Problem Relation Age of Onset   COPD Mother    Asthma Son    Cancer Father        lung   Prostate cancer Neg Hx    Kidney disease Neg Hx     Social History   Socioeconomic History   Marital status: Married    Spouse name: Not on file   Number of children: 3   Years of education: Not on file  Highest education level: Associate degree: academic program  Occupational History   Occupation: retired  Tobacco Use   Smoking status: Former    Packs/day: 1.00    Years: 20.00    Pack years: 20.00    Types: Cigarettes    Quit date: 08/09/1983    Years since quitting: 37.0   Smokeless tobacco: Never   Tobacco comments:    quit smoking over 40 years ago  Vaping Use   Vaping Use: Never used  Substance and Sexual Activity   Alcohol use: Yes    Alcohol/week: 0.0 standard drinks    Comment: once monthly   Drug use: No   Sexual activity: Not on file  Other Topics Concern   Not on file  Social History Narrative   Not on file   Social Determinants of Health   Financial Resource Strain: Low Risk    Difficulty of Paying  Living Expenses: Not hard at all  Food Insecurity: No Food Insecurity   Worried About Charity fundraiser in the Last Year: Never true   [place] in the Last Year: Never true  Transportation Needs: No Transportation Needs   Lack of Transportation (Medical): No   Lack of Transportation (Non-Medical): No  Physical Activity: Inactive   Days of Exercise per Week: 0 days   Minutes of Exercise per Session: 0 min  Stress: No Stress Concern Present   Feeling of Stress : Not at all  Social Connections: Moderately Integrated   Frequency of Communication with Friends and Family: More than three times a week   Frequency of Social Gatherings with Friends and Family: Not on file   Attends Religious Services: More than 4 times per year   Active Member of [employer] or Organizations: No   Attends Archivist Meetings: Never   Marital Status: Married  Human resources officer Violence: Not At Risk   Fear of Current or Ex-Partner: No   Emotionally Abused: No   Physically Abused: No   Sexually Abused: No      Review of Systems  Constitutional:  Negative for chills, fatigue and unexpected weight change.  HENT:  Positive for voice change. Negative for congestion, rhinorrhea, sneezing and sore throat.   Eyes:  Negative for redness.  Respiratory:  Negative for cough, chest tightness and shortness of breath.   Cardiovascular:  Negative for chest pain and palpitations.  Gastrointestinal:  Negative for abdominal pain, constipation, diarrhea, nausea and vomiting.  Genitourinary:  Negative for dysuria and frequency.  Musculoskeletal:  Negative for arthralgias, back pain, joint swelling and neck pain.  Skin:  Negative for rash.  Neurological: Negative.  Negative for tremors and numbness.  Hematological:  Negative for adenopathy. Does not bruise/bleed easily.  Psychiatric/Behavioral:  Negative for behavioral problems (Depression), sleep disturbance and suicidal ideas. The patient is not nervous/anxious.     Vital Signs: BP 128/84   Pulse 62   Temp 98.2 F (36.8 C)   Resp 16   Ht 5\' 10"  (1.778 m)   Wt 182 lb (82.6 kg)   SpO2 94%   BMI 26.11 kg/m    Physical Exam Vitals reviewed.  Constitutional:      General: He is not in acute distress.    Appearance: Normal appearance. He is well-developed, well-groomed and overweight. He is not diaphoretic.  HENT:     Head: Normocephalic and atraumatic.     Mouth/Throat:     Pharynx: No oropharyngeal exudate.  Eyes:     Pupils: Pupils  are equal, round, and reactive to light.  Neck:     Thyroid: No thyromegaly.     Vascular: No JVD.     Trachea: No tracheal deviation.  Cardiovascular:     Rate and Rhythm: Normal rate and regular rhythm.     Pulses: Normal pulses.     Heart sounds: Normal heart sounds. No murmur heard.   No friction rub. No gallop.  Pulmonary:     Effort: Pulmonary effort is normal. No respiratory distress.     Breath sounds: Normal breath sounds. No wheezing or rales.  Chest:     Chest wall: No tenderness.  Abdominal:     General: Bowel sounds are normal.     Palpations: Abdomen is soft.  Musculoskeletal:        General: Normal range of motion.  Skin:    General: Skin is warm and dry.  Neurological:     Mental Status: He is alert and oriented to person, place, and time.     Cranial Nerves: No cranial nerve deficit.  Psychiatric:        Behavior: Behavior normal. Behavior is cooperative.        Thought Content: Thought content normal.        Judgment: Judgment normal.    Assessment/Plan: 1. Chronic obstructive pulmonary disease, unspecified COPD type ([place]) [place] has a history of COPD.  Dr. Heidi Dach put the patient on Trelegy previously.  The patient is satisfied with the effectiveness of the medication other than the fact that he feels like he is losing his voice every day because of the medication.  He states that he starts out the day with no change in voice but by the end of the day he feels hoarse and  is sometimes down to whisper.  Discussed the triple therapy inhaler Breztri with the patient, he is interested in trying this inhaler out and states he will stick with it if it does not affect his voice.  Samples provided for 2 weeks to the patient.  The patient will notify via MyChart as to whether the medication is effective or not.  If he decides that the medication is effective and not affecting his voice, a prescription for Judithann Sauger will be sent to his pharmacy.  We will follow-up in 6 months to reassess spirometry and medication efficacy.  Patient requested not to have a follow-up visit to discuss the effectiveness of Judithann Sauger so it was agreed that he will send a MyChart message to request a prescription if needed.  2. OSA on CPAP Patient has obstructive sleep apnea and is currently using CPAP at night he reports that this is effective and is helping him to sleep better and breathe better at night.  He does not wake up feeling fatigued.  Continue to use the CPAP as instructed.  3. Shortness of breath Patient has intermittent shortness of breath related to COPD.  A spirometry was done in office to evaluate for any change in his respiratory status.  Results from the spirometry did show slight improvement. - Spirometry with Graph   General Counseling: Shivaay verbalizes understanding of the findings of todays visit and agrees with plan of treatment. I have discussed any further diagnostic evaluation that may be needed or ordered today. We also reviewed his medications today. he has been encouraged to call the office with any questions or concerns that should arise related to todays visit.    Orders Placed This Encounter  Procedures   Spirometry with Graph  No orders of the defined types were placed in this encounter.   Return in about 6 months (around 02/14/2021) for F/U, pulmonary/sleep, Saarah Dewing PCP.   Total time spent:30 Minutes Time spent includes review of chart, medications, test  results, and follow up plan with the patient.   [place] Controlled Substance Database was reviewed by me.  This patient was seen by Jonetta Osgood, FNP-C in collaboration with Dr. Clayborn Bigness as a part of collaborative care agreement.   Roye Gustafson R. Valetta Fuller, MSN, FNP-C Internal medicine

## 2020-08-16 LAB — COMPREHENSIVE METABOLIC PANEL
ALT: 22 IU/L (ref 0–44)
AST: 55 IU/L — ABNORMAL HIGH (ref 0–40)
Albumin/Globulin Ratio: 1.5 (ref 1.2–2.2)
Albumin: 4.3 g/dL (ref 3.7–4.7)
Alkaline Phosphatase: 72 IU/L (ref 44–121)
BUN/Creatinine Ratio: 14 (ref 10–24)
BUN: 15 mg/dL (ref 8–27)
Bilirubin Total: 0.5 mg/dL (ref 0.0–1.2)
CO2: 24 mmol/L (ref 20–29)
Calcium: 9 mg/dL (ref 8.6–10.2)
Chloride: 92 mmol/L — ABNORMAL LOW (ref 96–106)
Creatinine, Ser: 1.05 mg/dL (ref 0.76–1.27)
Globulin, Total: 2.9 g/dL (ref 1.5–4.5)
Glucose: 93 mg/dL (ref 65–99)
Potassium: 4.4 mmol/L (ref 3.5–5.2)
Sodium: 133 mmol/L — ABNORMAL LOW (ref 134–144)
Total Protein: 7.2 g/dL (ref 6.0–8.5)
eGFR: 73 mL/min/{1.73_m2} (ref >59)

## 2020-08-16 LAB — LIPID PANEL WITH LDL/HDL RATIO
Cholesterol, Total: 202 mg/dL — ABNORMAL HIGH (ref 100–199)
HDL: 59 mg/dL (ref >39)
LDL Chol Calc (NIH): 128 mg/dL — ABNORMAL HIGH (ref 0–99)
LDL/HDL Ratio: 2.2 ratio (ref 0.0–3.6)
Triglycerides: 86 mg/dL (ref 0–149)
VLDL Cholesterol Cal: 15 mg/dL (ref 5–40)

## 2020-09-03 ENCOUNTER — Ambulatory Visit: Payer: Medicare PPO | Admitting: Nurse Practitioner

## 2020-09-12 ENCOUNTER — Other Ambulatory Visit: Payer: Self-pay | Admitting: Family Medicine

## 2020-09-12 DIAGNOSIS — M25512 Pain in left shoulder: Secondary | ICD-10-CM

## 2020-09-19 ENCOUNTER — Telehealth: Payer: Self-pay

## 2020-09-19 ENCOUNTER — Other Ambulatory Visit: Payer: Self-pay

## 2020-09-19 DIAGNOSIS — J449 Chronic obstructive pulmonary disease, unspecified: Secondary | ICD-10-CM

## 2020-09-19 MED ORDER — TRELEGY ELLIPTA 100-62.5-25 MCG/INH IN AEPB: 1.0000 | INHALATION_SPRAY | Freq: Every day | RESPIRATORY_TRACT | 2 refills | Status: DC

## 2020-09-19 NOTE — Telephone Encounter (Signed)
Pt advised stopped Breztri and send trelegy pres as per [employer]

## 2020-09-19 NOTE — Telephone Encounter (Signed)
Patient called requesting rx refill. Sent message to [place]

## 2020-09-19 NOTE — Telephone Encounter (Signed)
-----   Message from [place] sent at 09/19/2020 10:14 AM EDT ----- Regarding: Refill He needs trelegy refill. He stated the [facility] gave him cramps. Need rx to go to [facility] in [place].

## 2020-09-21 ENCOUNTER — Other Ambulatory Visit: Payer: Self-pay | Admitting: Internal Medicine

## 2020-09-25 ENCOUNTER — Other Ambulatory Visit: Payer: Self-pay

## 2020-09-25 ENCOUNTER — Telehealth: Payer: Self-pay

## 2020-09-25 NOTE — Telephone Encounter (Signed)
Gave sample for trelegy and spoke desha is working on [place]

## 2020-10-02 ENCOUNTER — Telehealth: Payer: Self-pay

## 2020-10-02 NOTE — Telephone Encounter (Signed)
PA for Trelegy Ellipta 100-62.5-25 MCG INH sent to covermymeds

## 2020-10-04 ENCOUNTER — Telehealth: Payer: Self-pay

## 2020-10-04 DIAGNOSIS — U071 COVID-19: Secondary | ICD-10-CM | POA: Diagnosis not present

## 2020-10-04 DIAGNOSIS — G4733 Obstructive sleep apnea (adult) (pediatric): Secondary | ICD-10-CM | POA: Diagnosis not present

## 2020-10-04 NOTE — Telephone Encounter (Unsigned)
Copied from [place] [phone]. Topic: General - Other >> Oct 04, 2020  1:00 PM Pawlus, Brayton Layman A wrote: Reason for CRM: Pts daughter called in asking for advice regarding the pt, [place] the [place] and believe the pt has covid and wanted to discuss if he needs to be put on any medications.

## 2020-11-26 ENCOUNTER — Other Ambulatory Visit: Payer: Self-pay | Admitting: Family Medicine

## 2020-11-26 DIAGNOSIS — J4550 Severe persistent asthma, uncomplicated: Secondary | ICD-10-CM

## 2020-11-26 DIAGNOSIS — J301 Allergic rhinitis due to pollen: Secondary | ICD-10-CM

## 2020-11-26 DIAGNOSIS — E7849 Other hyperlipidemia: Secondary | ICD-10-CM

## 2020-11-26 DIAGNOSIS — G8929 Other chronic pain: Secondary | ICD-10-CM

## 2020-11-26 DIAGNOSIS — M25512 Pain in left shoulder: Secondary | ICD-10-CM

## 2020-12-20 ENCOUNTER — Ambulatory Visit: Payer: Medicare Other | Admitting: Family Medicine

## 2020-12-20 ENCOUNTER — Other Ambulatory Visit: Payer: Self-pay

## 2020-12-20 ENCOUNTER — Encounter: Payer: Self-pay | Admitting: Family Medicine

## 2020-12-20 VITALS — BP 130/70 | HR 84 | Ht 70.0 in | Wt 176.0 lb

## 2020-12-20 DIAGNOSIS — R233 Spontaneous ecchymoses: Secondary | ICD-10-CM | POA: Diagnosis not present

## 2020-12-20 DIAGNOSIS — E7849 Other hyperlipidemia: Secondary | ICD-10-CM | POA: Diagnosis not present

## 2020-12-20 DIAGNOSIS — R7401 Elevation of levels of liver transaminase levels: Secondary | ICD-10-CM | POA: Diagnosis not present

## 2020-12-20 NOTE — Progress Notes (Signed)
Date:  12/20/2020   Name:  Marcus Delacruz   DOB:  [birth year]   MRN:  [id number]   Chief Complaint: Elevated Hepatic Enzymes  HPI  Lab Results  Component Value Date   CREATININE 1.05 08/15/2020   BUN 15 08/15/2020   NA 133 (L) 08/15/2020   K 4.4 08/15/2020   CL 92 (L) 08/15/2020   CO2 24 08/15/2020   Lab Results  Component Value Date   CHOL 202 (H) 08/15/2020   HDL 59 08/15/2020   LDLCALC 128 (H) 08/15/2020   TRIG 86 08/15/2020   CHOLHDL 3.5 08/20/2017   No results found for: TSH No results found for: HGBA1C Lab Results  Component Value Date   WBC 8.2 08/08/2013   HGB 15.7 07/10/2014   HCT 43.3 08/08/2013   MCV 86.7 08/08/2013   PLT 354.0 08/08/2013   Lab Results  Component Value Date   ALT 22 08/15/2020   AST 55 (H) 08/15/2020   ALKPHOS 72 08/15/2020   BILITOT 0.5 08/15/2020     Review of Systems  Constitutional:  Negative for chills and fever.  HENT:  Negative for drooling, ear discharge, ear pain and sore throat.   Respiratory:  Negative for cough, shortness of breath and wheezing.   Cardiovascular:  Negative for chest pain, palpitations and leg swelling.  Gastrointestinal:  Negative for abdominal pain, blood in stool, constipation, diarrhea and nausea.  Endocrine: Negative for polydipsia.  Genitourinary:  Negative for dysuria, frequency, hematuria and urgency.  Musculoskeletal:  Negative for back pain, myalgias and neck pain.  Skin:  Negative for rash.       Easy bruising  Allergic/Immunologic: Negative for environmental allergies.  Neurological:  Negative for dizziness and headaches.  Hematological:  Does not bruise/bleed easily.  Psychiatric/Behavioral:  Negative for suicidal ideas. The patient is not nervous/anxious.    Patient Active Problem List   Diagnosis Date Noted   Chronic obstructive pulmonary disease with acute exacerbation ([place]) 07/27/2017   Asthma, chronic, severe persistent, uncomplicated 75/91/6384   Seasonal allergic  rhinitis due to pollen 08/18/2016   Bilateral carotid artery stenosis 04/03/2016   Bilateral hydrocele 11/22/2014   BPH with obstruction/lower urinary tract symptoms 10/05/2014   Epididymal cyst 10/05/2014   Familial multiple lipoprotein-type hyperlipidemia 07/14/2014   Laboratory animal allergy 07/14/2014   Acute arthropathy 07/14/2014   Routine general medical examination at a health care facility 07/14/2014   Asthma with exacerbation 07/14/2014   Essential (primary) hypertension 07/14/2014   Screening for depression 07/14/2014   Noncompliance w/medication treatment due to intermit use of medication 11/23/2013   Asthma, chronic 08/08/2013    Allergies  Allergen Reactions   Codeine Itching    Past Surgical History:  Procedure Laterality Date   ARTERY REPAIR     BACK SURGERY     CHOLECYSTECTOMY     COLONOSCOPY  2013   cleared   HERNIA REPAIR      Social History   Tobacco Use   Smoking status: Former    Packs/day: 1.00    Years: 20.00    Pack years: 20.00    Types: Cigarettes    Quit date: 08/09/1983    Years since quitting: 37.3   Smokeless tobacco: Never   Tobacco comments:    quit smoking over 40 years ago  Vaping Use   Vaping Use: Never used  Substance Use Topics   Alcohol use: Yes    Alcohol/week: 0.0 standard drinks    Comment: once monthly   Drug  use: No     Medication list has been reviewed and updated.  Current Meds  Medication Sig   ALLERGY RELIEF 180 MG tablet TAKE (1) TABLET BY MOUTH EVERY DAY   aspirin 81 MG tablet Take 81 mg by mouth daily.   atorvastatin (LIPITOR) 40 MG tablet TAKE (1) TABLET BY MOUTH EVERY DAY   carbamide peroxide (DEBROX) 6.5 % OTIC solution Place 5 drops into the right ear 2 (two) times daily.   finasteride (PROSCAR) 5 MG tablet TAKE (1) TABLET BY MOUTH EVERY DAY   Fluticasone-Umeclidin-Vilant (TRELEGY ELLIPTA) 100-62.5-25 MCG/INH AEPB Inhale 1 puff into the lungs daily.   ibuprofen (ADVIL) 800 MG tablet TAKE (1) TABLET  BY MOUTH THREE TIMES A DAY AS NEEDED FOR ARTHRITIS PAIN   KETOCONAZOLE-HYDROCORTISONE EX Apply topically.   lisinopril-hydrochlorothiazide (ZESTORETIC) 10-12.5 MG tablet One twice a day   montelukast (SINGULAIR) 10 MG tablet TAKE (1) TABLET BY MOUTH EVERY DAY   NON FORMULARY cpap device   VENTOLIN HFA 108 (90 Base) MCG/ACT inhaler USE 2 PUFFS EVERY 6 HOURS AS NEEDED SHORTNESS OF BREATH OR WHEEZING   Current Facility-Administered Medications for the 12/20/20 encounter (Office Visit) with Juline Patch, MD  Medication   ipratropium-albuterol (DUONEB) 0.5-2.5 (3) MG/3ML nebulizer solution 3 mL   ipratropium-albuterol (DUONEB) 0.5-2.5 (3) MG/3ML nebulizer solution 3 mL    PHQ 2/9 Scores 07/12/2020 02/20/2020 09/06/2019 02/23/2019  PHQ - 2 Score 0 0 0 0  PHQ- 9 Score 0 - 0 0    GAD 7 : Generalized Anxiety Score 07/12/2020 09/06/2019 02/23/2019  Nervous, Anxious, on Edge 1 0 0  Control/stop worrying 0 0 0  Worry too much - different things 0 0 0  Trouble relaxing 0 0 0  Restless 0 0 0  Easily annoyed or irritable 0 0 0  Afraid - awful might happen 0 0 0  Total GAD 7 Score 1 0 0    BP Readings from Last 3 Encounters:  08/15/20 128/84  08/14/20 130/72  07/13/20 (!) 147/78    Physical Exam Vitals and nursing note reviewed.  HENT:     Head: Normocephalic.     Right Ear: Tympanic membrane and external ear normal.     Left Ear: Tympanic membrane and external ear normal.     Nose: Nose normal. No congestion or rhinorrhea.  Eyes:     General: No scleral icterus.       Right eye: No discharge.        Left eye: No discharge.     Conjunctiva/sclera: Conjunctivae normal.     Pupils: Pupils are equal, round, and reactive to light.  Neck:     Thyroid: No thyromegaly.     Vascular: No JVD.     Trachea: No tracheal deviation.  Cardiovascular:     Rate and Rhythm: Normal rate and regular rhythm.     Heart sounds: Normal heart sounds. No murmur heard.   No friction rub. No gallop.   Pulmonary:     Effort: No respiratory distress.     Breath sounds: Normal breath sounds. No wheezing or rales.  Abdominal:     General: Bowel sounds are normal.     Palpations: Abdomen is soft. There is no mass.     Tenderness: There is no abdominal tenderness. There is no guarding or rebound.  Musculoskeletal:        General: No tenderness. Normal range of motion.     Cervical back: Normal range of motion and neck supple.  Lymphadenopathy:     Cervical: No cervical adenopathy.  Skin:    General: Skin is warm.     Findings: No rash.  Neurological:     Mental Status: He is alert and oriented to person, place, and time.     Cranial Nerves: No cranial nerve deficit.     Deep Tendon Reflexes: Reflexes are normal and symmetric.    Wt Readings from Last 3 Encounters:  12/20/20 176 lb (79.8 kg)  08/15/20 182 lb (82.6 kg)  08/14/20 177 lb (80.3 kg)    Ht 5\' 10"  (1.778 m)   Wt 176 lb (79.8 kg)   BMI 25.25 kg/m   Assessment and Plan:  1. Elevated AST (SGOT) Chronic.  Episodic pain.  Has noticed some elevation of the AST in the past.  This is a mild elevation but there is no symptomatology such as hepatic pain or change in color of urine.  We will repeat hepatic function panel and proceed accordingly.  Her neck step may be ultrasound of the liver if elevated. - Hepatic function panel  2. Easy bruisability Patient brought to my attention that he is bruising easily and I noted that he was on aspirin 81 mg.  Since this is been ongoing we will  check a CBC to determine a platelet count. - CBC with Differential/Platelet  3. Familial multiple lipoprotein-type hyperlipidemia Noted patient has elevated cholesterol in the past and is currently on a statin/atorvastatin 40 mg. - Lipid Panel With LDL/HDL Ratio

## 2020-12-21 LAB — HEPATIC FUNCTION PANEL
ALT: 40 IU/L (ref 0–44)
AST: 69 IU/L — ABNORMAL HIGH (ref 0–40)
Albumin: 4.6 g/dL (ref 3.7–4.7)
Alkaline Phosphatase: 73 IU/L (ref 44–121)
Bilirubin Total: 0.6 mg/dL (ref 0.0–1.2)
Bilirubin, Direct: 0.14 mg/dL (ref 0.00–0.40)
Total Protein: 7.2 g/dL (ref 6.0–8.5)

## 2020-12-21 LAB — CBC WITH DIFFERENTIAL/PLATELET
Basophils Absolute: 0.1 10*3/uL (ref 0.0–0.2)
Basos: 1 %
EOS (ABSOLUTE): 0.2 10*3/uL (ref 0.0–0.4)
Eos: 2 %
Hematocrit: 46.5 % (ref 37.5–51.0)
Hemoglobin: 15.6 g/dL (ref 13.0–17.7)
Immature Grans (Abs): 0 10*3/uL (ref 0.0–0.1)
Immature Granulocytes: 0 %
Lymphocytes Absolute: 1.1 10*3/uL (ref 0.7–3.1)
Lymphs: 10 %
MCH: 29.6 pg (ref 26.6–33.0)
MCHC: 33.5 g/dL (ref 31.5–35.7)
MCV: 88 fL (ref 79–97)
Monocytes Absolute: 1 10*3/uL — ABNORMAL HIGH (ref 0.1–0.9)
Monocytes: 9 %
Neutrophils Absolute: 8.4 10*3/uL — ABNORMAL HIGH (ref 1.4–7.0)
Neutrophils: 78 %
Platelets: 312 10*3/uL (ref 150–450)
RBC: 5.27 x10E6/uL (ref 4.14–5.80)
RDW: 11.9 % (ref 11.6–15.4)
WBC: 10.8 10*3/uL (ref 3.4–10.8)

## 2020-12-21 LAB — LIPID PANEL WITH LDL/HDL RATIO
Cholesterol, Total: 192 mg/dL (ref 100–199)
HDL: 58 mg/dL (ref >39)
LDL Chol Calc (NIH): 111 mg/dL — ABNORMAL HIGH (ref 0–99)
LDL/HDL Ratio: 1.9 ratio (ref 0.0–3.6)
Triglycerides: 129 mg/dL (ref 0–149)
VLDL Cholesterol Cal: 23 mg/dL (ref 5–40)

## 2021-01-03 DIAGNOSIS — G4733 Obstructive sleep apnea (adult) (pediatric): Secondary | ICD-10-CM | POA: Diagnosis not present

## 2021-01-14 ENCOUNTER — Other Ambulatory Visit (INDEPENDENT_AMBULATORY_CARE_PROVIDER_SITE_OTHER): Payer: Self-pay | Admitting: Nurse Practitioner

## 2021-01-14 DIAGNOSIS — I6523 Occlusion and stenosis of bilateral carotid arteries: Secondary | ICD-10-CM

## 2021-01-16 ENCOUNTER — Ambulatory Visit: Payer: Medicare Other

## 2021-01-18 ENCOUNTER — Other Ambulatory Visit: Payer: Self-pay

## 2021-01-18 ENCOUNTER — Ambulatory Visit (INDEPENDENT_AMBULATORY_CARE_PROVIDER_SITE_OTHER): Payer: Medicare Other | Admitting: Vascular Surgery

## 2021-01-18 ENCOUNTER — Ambulatory Visit (INDEPENDENT_AMBULATORY_CARE_PROVIDER_SITE_OTHER): Payer: Medicare Other

## 2021-01-18 DIAGNOSIS — I6523 Occlusion and stenosis of bilateral carotid arteries: Secondary | ICD-10-CM | POA: Diagnosis not present

## 2021-01-21 ENCOUNTER — Encounter (INDEPENDENT_AMBULATORY_CARE_PROVIDER_SITE_OTHER): Payer: Self-pay | Admitting: *Deleted

## 2021-01-23 ENCOUNTER — Ambulatory Visit: Payer: Medicare Other

## 2021-01-23 DIAGNOSIS — L218 Other seborrheic dermatitis: Secondary | ICD-10-CM | POA: Diagnosis not present

## 2021-01-23 DIAGNOSIS — L578 Other skin changes due to chronic exposure to nonionizing radiation: Secondary | ICD-10-CM | POA: Diagnosis not present

## 2021-01-23 DIAGNOSIS — Z872 Personal history of diseases of the skin and subcutaneous tissue: Secondary | ICD-10-CM | POA: Diagnosis not present

## 2021-01-23 DIAGNOSIS — Z85828 Personal history of other malignant neoplasm of skin: Secondary | ICD-10-CM | POA: Diagnosis not present

## 2021-01-25 DIAGNOSIS — H2513 Age-related nuclear cataract, bilateral: Secondary | ICD-10-CM | POA: Diagnosis not present

## 2021-02-13 ENCOUNTER — Other Ambulatory Visit: Payer: Self-pay

## 2021-02-13 ENCOUNTER — Ambulatory Visit (INDEPENDENT_AMBULATORY_CARE_PROVIDER_SITE_OTHER): Payer: Medicare Other

## 2021-02-13 DIAGNOSIS — G4733 Obstructive sleep apnea (adult) (pediatric): Secondary | ICD-10-CM

## 2021-02-13 NOTE — Progress Notes (Signed)
95 percentile pressure 11    95th percentile leak 16.0    apnea index 1.0 /hr  apnea-hypopnea index  1.9 /hr    total days used  >4 hr 58 days  total days used <4 hr 0 days   Total compliance 58 percent   He is doing great on days not wearing this one wears old cpap (traveling) No problems or questions at this time. Pt was seen by Claiborne Billings  RRT/RCP  from [facility]

## 2021-02-14 ENCOUNTER — Encounter: Payer: Self-pay | Admitting: Nurse Practitioner

## 2021-02-14 ENCOUNTER — Ambulatory Visit: Payer: Medicare Other | Admitting: Nurse Practitioner

## 2021-02-14 VITALS — BP 140/67 | HR 63 | Temp 98.8°F | Resp 16 | Ht 70.0 in | Wt 180.0 lb

## 2021-02-14 DIAGNOSIS — Z7189 Other specified counseling: Secondary | ICD-10-CM | POA: Diagnosis not present

## 2021-02-14 DIAGNOSIS — Z9989 Dependence on other enabling machines and devices: Secondary | ICD-10-CM | POA: Diagnosis not present

## 2021-02-14 DIAGNOSIS — G4733 Obstructive sleep apnea (adult) (pediatric): Secondary | ICD-10-CM

## 2021-02-14 DIAGNOSIS — J449 Chronic obstructive pulmonary disease, unspecified: Secondary | ICD-10-CM

## 2021-02-14 MED ORDER — FLUTICASONE-UMECLIDIN-VILANT 100-62.5-25 MCG/ACT IN AEPB: 1.0000 | INHALATION_SPRAY | Freq: Every day | RESPIRATORY_TRACT | 2 refills | Status: DC

## 2021-02-14 NOTE — Progress Notes (Signed)
[facility] [place], [place] [address]  Internal MEDICINE  Office Visit Note  Patient Name: Marcus Delacruz  [id number]  [id number]  Date of Service: 03/17/2021  Chief Complaint  Patient presents with   Follow-up   Sleep Apnea   COPD    HPI [name] presents for a follow up visit for COPD, sleep apnea and CPAP use. He recently had his CPAP download which can be seen below:   CPAP Download:  95th percentile pressure: 11 95th percentile leak: 16.0 apnea index: 1.0 /hr apnea-hypopnea index:  1.9 /hr total days used  >4 hr: 58 days total days used <4 hr: 0 days Total compliance: 58 percent   He is technically 99% compliant but using old machine at [facility] which does now track usage.He is doing great with CPAP per patient. He wears old cpap when traveling away from home. Keeps his newer CPAP at home. He has no problems or questions at this time. Pt was seen by Claiborne Billings RRT/RCP from [facility]. He states that he is waking up rested in the morning. He denies any daytime drowsiness or awakenings in the middle of the night.   He is on trelegy ellipta for maintenance inhaler and has been using it since may 2019. He denies any SOB, wheezing or increased cough. He reports that trelegy works well in controlling his COPD symptoms and exacerbations. He has not had a pulmonary function test since 2019. He needs to have a PFT to determine current respiratory status and compare to prior PFTs.    Current Medication: Outpatient Encounter Medications as of 02/14/2021  Medication Sig Note   aspirin 81 MG tablet Take 81 mg by mouth daily.    atorvastatin (LIPITOR) 40 MG tablet TAKE (1) TABLET BY MOUTH EVERY DAY    carbamide peroxide (DEBROX) 6.5 % OTIC solution Place 5 drops into the right ear 2 (two) times daily. (Patient not taking: Reported on 02/20/2021) 02/20/2020: PRN only   finasteride (PROSCAR) 5 MG tablet TAKE (1) TABLET BY MOUTH EVERY DAY    ibuprofen (ADVIL) 800 MG  tablet TAKE (1) TABLET BY MOUTH THREE TIMES A DAY AS NEEDED FOR ARTHRITIS PAIN    KETOCONAZOLE-HYDROCORTISONE EX Apply topically. 02/20/2020: Dr. Dierdre Searles   NON FORMULARY cpap device    VENTOLIN HFA 108 (90 Base) MCG/ACT inhaler USE 2 PUFFS EVERY 6 HOURS AS NEEDED SHORTNESS OF BREATH OR WHEEZING    [DISCONTINUED] ALLERGY RELIEF 180 MG tablet TAKE (1) TABLET BY MOUTH EVERY DAY 02/20/2021: PRN   [DISCONTINUED] Fluticasone-Umeclidin-Vilant (TRELEGY ELLIPTA) 100-62.5-25 MCG/INH AEPB Inhale 1 puff into the lungs daily.    [DISCONTINUED] lisinopril-hydrochlorothiazide (ZESTORETIC) 10-12.5 MG tablet One twice a day    [DISCONTINUED] montelukast (SINGULAIR) 10 MG tablet TAKE (1) TABLET BY MOUTH EVERY DAY 02/20/2021: PRN   Fluticasone-Umeclidin-Vilant (TRELEGY ELLIPTA) 100-62.5-25 MCG/ACT AEPB Inhale 1 puff into the lungs daily.    Facility-Administered Encounter Medications as of 02/14/2021  Medication   ipratropium-albuterol (DUONEB) 0.5-2.5 (3) MG/3ML nebulizer solution 3 mL   ipratropium-albuterol (DUONEB) 0.5-2.5 (3) MG/3ML nebulizer solution 3 mL    Surgical History: Past Surgical History:  Procedure Laterality Date   ARTERY REPAIR     BACK SURGERY     CHOLECYSTECTOMY     COLONOSCOPY  2013   cleared   HERNIA REPAIR      Medical History: Past Medical History:  Diagnosis Date   Allergic rhinitis due to pollen    Asthma    BPH (benign prostatic hyperplasia)  Cardiomegaly    Chronic obstructive pulmonary disease (COPD) (HCC)    Hypercholesteremia    Hypersomnia, unspecified    Hypertension    Obstructive sleep apnea (adult) (pediatric)    Shortness of breath    Snoring    Solitary pulmonary nodule     Family History: Family History  Problem Relation Age of Onset   COPD Mother    Cancer Father        lung   Asthma Son    Prostate cancer Neg Hx    Kidney disease Neg Hx     Social History   Socioeconomic History   Marital status: Married    Spouse name: Not on  file   Number of children: 3   Years of education: Not on file   Highest education level: Associate degree: academic program  Occupational History   Occupation: retired  Tobacco Use   Smoking status: Former    Packs/day: 1.00    Years: 20.00    Pack years: 20.00    Types: Cigarettes    Quit date: 08/09/1983    Years since quitting: 37.6   Smokeless tobacco: Never   Tobacco comments:    quit smoking over 40 years ago  Vaping Use   Vaping Use: Never used  Substance and Sexual Activity   Alcohol use: Yes    Alcohol/week: 0.0 standard drinks    Comment: once monthly   Drug use: No   Sexual activity: Not on file  Other Topics Concern   Not on file  Social History Narrative   Not on file   Social Determinants of Health   Financial Resource Strain: Low Risk    Difficulty of Paying Living Expenses: Not hard at all  Food Insecurity: No Food Insecurity   Worried About Charity fundraiser in the Last Year: Never true   [place] in the Last Year: Never true  Transportation Needs: No Transportation Needs   Lack of Transportation (Medical): No   Lack of Transportation (Non-Medical): No  Physical Activity: Sufficiently Active   Days of Exercise per Week: 7 days   Minutes of Exercise per Session: 30 min  Stress: No Stress Concern Present   Feeling of Stress : Not at all  Social Connections: Moderately Integrated   Frequency of Communication with Friends and Family: More than three times a week   Frequency of Social Gatherings with Friends and Family: More than three times a week   Attends Religious Services: More than 4 times per year   Active Member of [employer] or Organizations: No   Attends Archivist Meetings: Never   Marital Status: Married  Human resources officer Violence: Not At Risk   Fear of Current or Ex-Partner: No   Emotionally Abused: No   Physically Abused: No   Sexually Abused: No      Review of Systems  Constitutional:  Negative for chills, fatigue  and unexpected weight change.  HENT:  Negative for congestion, postnasal drip, rhinorrhea, sneezing and sore throat.   Eyes:  Negative for redness.  Respiratory: Negative.  Negative for cough, chest tightness, shortness of breath and wheezing.   Cardiovascular: Negative.  Negative for chest pain and palpitations.  Gastrointestinal:  Negative for abdominal pain, constipation, diarrhea, nausea and vomiting.  Genitourinary:  Negative for dysuria and frequency.  Musculoskeletal:  Negative for arthralgias, back pain, joint swelling and neck pain.  Skin:  Negative for rash.  Neurological: Negative.  Negative for tremors and numbness.  Hematological:  Negative for adenopathy. Does not bruise/bleed easily.  Psychiatric/Behavioral:  Negative for behavioral problems (Depression), sleep disturbance and suicidal ideas. The patient is not nervous/anxious.    Vital Signs: BP 140/67   Pulse 63   Temp 98.8 F (37.1 C)   Resp 16   Ht 5\' 10"  (1.778 m)   Wt 180 lb (81.6 kg)   SpO2 97%   BMI 25.83 kg/m    Physical Exam Vitals reviewed.  Constitutional:      General: He is not in acute distress.    Appearance: Normal appearance. He is normal weight. He is not ill-appearing.  HENT:     Head: Normocephalic and atraumatic.  Eyes:     Pupils: Pupils are equal, round, and reactive to light.  Cardiovascular:     Rate and Rhythm: Normal rate and regular rhythm.     Pulses: Normal pulses.     Heart sounds: Normal heart sounds. No murmur heard. Pulmonary:     Effort: Pulmonary effort is normal. No respiratory distress.     Breath sounds: Normal breath sounds. No wheezing or rhonchi.  Neurological:     Mental Status: He is alert and oriented to person, place, and time.     Cranial Nerves: No cranial nerve deficit.     Coordination: Coordination normal.     Gait: Gait normal.  Psychiatric:        Mood and Affect: Mood normal.        Behavior: Behavior normal.       Assessment/Plan: 1. OSA on  CPAP Continue to use CPAP as instructed. Compliance is off according to download but patient has an older CPAP he uses when traveling or on vacation that does not track use.   2. Chronic obstructive pulmonary disease, unspecified COPD type ([place]) Trelegy refills ordered. PFT ordered, need to get scheduled.  - Pulmonary function test; Future - Fluticasone-Umeclidin-Vilant (TRELEGY ELLIPTA) 100-62.5-25 MCG/ACT AEPB; Inhale 1 puff into the lungs daily.  Dispense: 180 each; Refill: 2  3. CPAP use counseling Patient does not need any supplies at this time. He has no questions or concerns regarding maintenance, cleaning or use of CPAP machine. Regular maintenance and cleaning as previous instructed encouraged.    General Counseling: Tray verbalizes understanding of the findings of todays visit and agrees with plan of treatment. I have discussed any further diagnostic evaluation that may be needed or ordered today. We also reviewed his medications today. he has been encouraged to call the office with any questions or concerns that should arise related to todays visit.    Orders Placed This Encounter  Procedures   Pulmonary function test    Meds ordered this encounter  Medications   Fluticasone-Umeclidin-Vilant (TRELEGY ELLIPTA) 100-62.5-25 MCG/ACT AEPB    Sig: Inhale 1 puff into the lungs daily.    Dispense:  180 each    Refill:  2    Please fill 90 day supply    Return in about 6 months (around 08/15/2021) for F/U, pulmonary/sleep, Zyler Hyson will get PFT prior to next office visit. .   Total time spent:30 Minutes Time spent includes review of chart, medications, test results, and follow up plan with the patient.   [place] Controlled Substance Database was reviewed by me.  This patient was seen by Jonetta Osgood, FNP-C in collaboration with Dr. Clayborn Bigness as a part of collaborative care agreement.   Mayanna Garlitz R. Valetta Fuller, MSN, FNP-C Internal medicine

## 2021-02-19 ENCOUNTER — Telehealth: Payer: Self-pay | Admitting: Family Medicine

## 2021-02-19 NOTE — Telephone Encounter (Signed)
error 

## 2021-02-20 ENCOUNTER — Ambulatory Visit (INDEPENDENT_AMBULATORY_CARE_PROVIDER_SITE_OTHER): Payer: Medicare Other

## 2021-02-20 DIAGNOSIS — Z0000 Encounter for general adult medical examination without abnormal findings: Secondary | ICD-10-CM

## 2021-02-20 NOTE — Progress Notes (Addendum)
Subjective:   Marcus Delacruz is a 80 y.o. male who presents for Medicare Annual/Subsequent preventive examination.  Virtual Visit via Telephone Note  I connected with  Marcus Delacruz on 02/20/21 at  1:20 PM EST by telephone and verified that I am speaking with the correct person using two identifiers.  Location: Patient: home Provider: [facility] Persons participating in the virtual visit: [place]   I discussed the limitations, risks, security and privacy concerns of performing an evaluation and management service by telephone and the availability of in person appointments. The patient expressed understanding and agreed to proceed.  Interactive audio and video telecommunications were attempted between this nurse and patient, however failed, due to patient having technical difficulties OR patient did not have access to video capability.  We continued and completed visit with audio only.  Some vital signs may be absent or patient reported.   Clemetine Marker, LPN   Review of Systems     Cardiac Risk Factors include: advanced age (>38men, >2 women);hypertension;dyslipidemia;male gender     Objective:    There were no vitals filed for this visit. There is no height or weight on file to calculate BMI.  Advanced Directives 02/20/2021 02/20/2020 02/16/2019 02/08/2018 04/03/2016 01/17/2015  Does Patient Have a Medical Advance Directive? Yes Yes Yes Yes Yes Yes  Type of Paramedic of [place];Living will [place];Living will [place];Living will [place];Living will [place];Living will Living will;Healthcare Power of [place] in Chart? No - copy requested No - copy requested No - copy requested No - copy requested - No - copy requested    Current Medications (verified) Outpatient Encounter Medications as of 02/20/2021   Medication Sig   ALLERGY RELIEF 180 MG tablet TAKE (1) TABLET BY MOUTH EVERY DAY   aspirin 81 MG tablet Take 81 mg by mouth daily.   atorvastatin (LIPITOR) 40 MG tablet TAKE (1) TABLET BY MOUTH EVERY DAY   finasteride (PROSCAR) 5 MG tablet TAKE (1) TABLET BY MOUTH EVERY DAY   Fluticasone-Umeclidin-Vilant (TRELEGY ELLIPTA) 100-62.5-25 MCG/ACT AEPB Inhale 1 puff into the lungs daily.   ibuprofen (ADVIL) 800 MG tablet TAKE (1) TABLET BY MOUTH THREE TIMES A DAY AS NEEDED FOR ARTHRITIS PAIN   KETOCONAZOLE-HYDROCORTISONE EX Apply topically.   lisinopril-hydrochlorothiazide (ZESTORETIC) 10-12.5 MG tablet One twice a day   montelukast (SINGULAIR) 10 MG tablet TAKE (1) TABLET BY MOUTH EVERY DAY   NON FORMULARY cpap device   VENTOLIN HFA 108 (90 Base) MCG/ACT inhaler USE 2 PUFFS EVERY 6 HOURS AS NEEDED SHORTNESS OF BREATH OR WHEEZING   carbamide peroxide (DEBROX) 6.5 % OTIC solution Place 5 drops into the right ear 2 (two) times daily. (Patient not taking: Reported on 02/20/2021)   Facility-Administered Encounter Medications as of 02/20/2021  Medication   ipratropium-albuterol (DUONEB) 0.5-2.5 (3) MG/3ML nebulizer solution 3 mL   ipratropium-albuterol (DUONEB) 0.5-2.5 (3) MG/3ML nebulizer solution 3 mL    Allergies (verified) Codeine   History: Past Medical History:  Diagnosis Date   Allergic rhinitis due to pollen    Asthma    BPH (benign prostatic hyperplasia)    Cardiomegaly    Chronic obstructive pulmonary disease (COPD) (HCC)    Hypercholesteremia    Hypersomnia, unspecified    Hypertension    Obstructive sleep apnea (adult) (pediatric)    Shortness of breath    Snoring    Solitary pulmonary nodule  Past Surgical History:  Procedure Laterality Date   ARTERY REPAIR     BACK SURGERY     CHOLECYSTECTOMY     COLONOSCOPY  2013   cleared   HERNIA REPAIR     Family History  Problem Relation Age of Onset   COPD Mother    Cancer Father        lung   Asthma Son    Prostate  cancer Neg Hx    Kidney disease Neg Hx    Social History   Socioeconomic History   Marital status: Married    Spouse name: Not on file   Number of children: 3   Years of education: Not on file   Highest education level: Associate degree: academic program  Occupational History   Occupation: retired  Tobacco Use   Smoking status: Former    Packs/day: 1.00    Years: 20.00    Pack years: 20.00    Types: Cigarettes    Quit date: 08/09/1983    Years since quitting: 37.5   Smokeless tobacco: Never   Tobacco comments:    quit smoking over 40 years ago  Vaping Use   Vaping Use: Never used  Substance and Sexual Activity   Alcohol use: Yes    Alcohol/week: 0.0 standard drinks    Comment: once monthly   Drug use: No   Sexual activity: Not on file  Other Topics Concern   Not on file  Social History Narrative   Not on file   Social Determinants of Health   Financial Resource Strain: Low Risk    Difficulty of Paying Living Expenses: Not hard at all  Food Insecurity: No Food Insecurity   Worried About Charity fundraiser in the Last Year: Never true   [place] in the Last Year: Never true  Transportation Needs: No Transportation Needs   Lack of Transportation (Medical): No   Lack of Transportation (Non-Medical): No  Physical Activity: Sufficiently Active   Days of Exercise per Week: 7 days   Minutes of Exercise per Session: 30 min  Stress: No Stress Concern Present   Feeling of Stress : Not at all  Social Connections: Moderately Integrated   Frequency of Communication with Friends and Family: More than three times a week   Frequency of Social Gatherings with Friends and Family: More than three times a week   Attends Religious Services: More than 4 times per year   Active Member of [employer] or Organizations: No   Attends Music therapist: Never   Marital Status: Married    Tobacco Counseling Counseling given: Not Answered Tobacco comments: quit smoking  over 40 years ago   Clinical Intake:  Pre-visit preparation completed: Yes  Pain : No/denies pain     Nutritional Risks: None Diabetes: No  How often do you need to have someone help you when you read instructions, pamphlets, or other written materials from your doctor or pharmacy?: 1 - Never    Interpreter Needed?: No  Information entered by :: Clemetine Marker LPN   Activities of Daily Living In your present state of health, do you have any difficulty performing the following activities: 02/20/2021  Hearing? Y  Vision? N  Difficulty concentrating or making decisions? N  Walking or climbing stairs? N  Dressing or bathing? N  Doing errands, shopping? N  Preparing Food and eating ? N  Using the Toilet? N  In the past six months, have you accidently leaked urine? N  Do you have problems with loss of bowel control? N  Managing your Medications? N  Managing your Finances? N  Housekeeping or managing your Housekeeping? N  Some recent data might be hidden    Patient Care Team: Juline Patch, MD as PCP - General (Family Medicine) Lucky Cowboy, Erskine Squibb, MD as Referring Physician (Vascular Surgery) Ree Edman, MD as Referring Physician (Dermatology) Jonetta Osgood, NP as Nurse Practitioner (Sleep Medicine)  Indicate any recent Medical Services you may have received from other than Delacruz providers in the past year (date may be approximate).     Assessment:   This is a routine wellness examination for Marcus Delacruz.  Hearing/Vision screen Hearing Screening - Comments:: Hearing eval done at [facility]; pt does qualify for hearing aids due to difficulty with high pitched tones but declines due to cost  Vision Screening - Comments:: Annual vision screenings done at [place] issues and exercise activities discussed: Current Exercise Habits: Home exercise routine, Type of exercise: walking, Time (Minutes): 30, Frequency (Times/Week): 7, Weekly Exercise  (Minutes/Week): 210, Intensity: Mild, Exercise limited by: None identified   Goals Addressed   None    Depression Screen PHQ 2/9 Scores 02/20/2021 07/12/2020 02/20/2020 09/06/2019 02/23/2019 02/16/2019 02/19/2018  PHQ - 2 Score 0 0 0 0 0 0 0  PHQ- 9 Score - 0 - 0 0 - 0    Fall Risk Fall Risk  02/20/2021 07/12/2020 02/20/2020 09/06/2019 02/16/2019  Falls in the past year? 0 0 0 0 0  Comment - - - - -  Number falls in past yr: 0 - 0 - 0  Injury with Fall? 0 - 0 - 0  Comment - - - - -  Risk for fall due to : No Fall Risks - No Fall Risks - -  Follow up Falls prevention discussed Falls evaluation completed Falls prevention discussed Falls evaluation completed Falls prevention discussed    FALL RISK PREVENTION PERTAINING TO THE HOME:  Any stairs in or around the home? Yes  If so, are there any without handrails? No  Home free of loose throw rugs in walkways, pet beds, electrical cords, etc? Yes  Adequate lighting in your home to reduce risk of falls? Yes   ASSISTIVE DEVICES UTILIZED TO PREVENT FALLS:  Life alert? No  Use of a cane, walker or w/c? No  Grab bars in the bathroom? No  Shower chair or bench in shower? No  Elevated toilet seat or a handicapped toilet? Yes   TIMED UP AND GO:  Was the test performed? No . Telephonic vis.   Cognitive Function: Normal cognitive status assessed by direct observation by this Nurse Health Advisor. No abnormalities found.       6CIT Screen 02/20/2020  What Year? 0 points  What month? 0 points  What time? 0 points  Count back from 20 0 points  Months in reverse 0 points  Repeat phrase 0 points  Total Score 0    Immunizations Immunization History  Administered Date(s) Administered   Influenza Split 12/08/2012   Influenza, High Dose Seasonal PF 12/07/2017, 11/30/2019   Influenza,inj,Quad PF,6+ Mos 01/06/2013, 11/23/2013   Influenza,inj,quad, With Preservative 12/24/2016, 11/25/2018   Influenza-Unspecified 01/07/2015, 01/21/2017,  12/04/2020   Moderna Sars-Covid-2 Vaccination 12/24/2020   PFIZER(Purple Top)SARS-COV-2 Vaccination 03/16/2019, 04/06/2019, 12/02/2019, 06/30/2020   Pneumococcal Conjugate-13 01/17/2015   Pneumococcal Polysaccharide-23 06/27/2010   Tdap 10/05/2011   Zoster Recombinat (Shingrix) 02/06/2018, 04/19/2018    TDAP status: Up to date  Flu Vaccine  status: Up to date  Pneumococcal vaccine status: Up to date  Covid-19 vaccine status: Completed vaccines  Qualifies for Shingles Vaccine? Yes   Zostavax completed Yes   Shingrix Completed?: Yes  Screening Tests Health Maintenance  Topic Date Due   TETANUS/TDAP  10/04/2021   Pneumonia Vaccine 55+ Years old  Completed   INFLUENZA VACCINE  Completed   COVID-19 Vaccine  Completed   Zoster Vaccines- Shingrix  Completed   HPV VACCINES  Aged Out   Hepatitis C Screening  Discontinued    Health Maintenance  There are no preventive care reminders to display for this patient.  Colorectal cancer screening: No longer required.   Lung Cancer Screening: (Low Dose CT Chest recommended if Age 58-80 years, 30 pack-year currently smoking OR have quit w/in 15years.) does not qualify.   Additional Screening:  Hepatitis C Screening: does qualify; postponed  Vision Screening: Recommended annual ophthalmology exams for early detection of glaucoma and other disorders of the eye. Is the patient up to date with their annual eye exam?  Yes  Who is the provider or what is the name of the office in which the patient attends annual eye exams? [facility].   Dental Screening: Recommended annual dental exams for proper oral hygiene  Community Resource Referral / Chronic Care Management: CRR required this visit?  No   CCM required this visit?  No      Plan:     I have personally reviewed and noted the following in the patients chart:   Medical and social history Use of alcohol, tobacco or illicit drugs  Current medications and supplements  including opioid prescriptions. Patient is not currently taking opioid prescriptions. Functional ability and status Nutritional status Physical activity Advanced directives List of other physicians Hospitalizations, surgeries, and ER visits in previous 12 months Vitals Screenings to include cognitive, depression, and falls Referrals and appointments  In addition, I have reviewed and discussed with patient certain preventive protocols, quality metrics, and best practice recommendations. A written personalized care plan for preventive services as well as general preventive health recommendations were provided to patient.     Clemetine Marker, LPN   [id number]   Nurse Notes: pt states his recent lisinopril/hctz rx was filled as once daily but he takes twice daily. Pt advised last rx sent on 07/12/20 was for twice daily. Pt requests new rx sent to [facility] to correct prescription. Thank you.

## 2021-02-20 NOTE — Patient Instructions (Signed)
Marcus Delacruz , Thank you for taking time to come for your Medicare Wellness Visit. I appreciate your ongoing commitment to your health goals. Please review the following plan we discussed and let me know if I can assist you in the future.   Screening recommendations/referrals: Colonoscopy: no longer required Recommended yearly ophthalmology/optometry visit for glaucoma screening and checkup Recommended yearly dental visit for hygiene and checkup  Vaccinations: Influenza vaccine: done 12/04/20 Pneumococcal vaccine: done 01/17/15 Tdap vaccine: done 10/05/11 Shingles vaccine: done 02/06/18 & 04/19/18 Covid-19: done 03/16/19, 04/06/19, 12/02/19, 06/30/20 & 12/24/20  Advanced directives: Please bring a copy of your health care power of attorney and living will to the office at your convenience.   Conditions/risks identified: Keep up the great work!  Next appointment: Follow up in one year for your annual wellness visit.   Preventive Care 44 Years and Older, Male Preventive care refers to lifestyle choices and visits with your health care provider that can promote health and wellness. What does preventive care include? A yearly physical exam. This is also called an annual well check. Dental exams once or twice a year. Routine eye exams. Ask your health care provider how often you should have your eyes checked. Personal lifestyle choices, including: Daily care of your teeth and gums. Regular physical activity. Eating a healthy diet. Avoiding tobacco and drug use. Limiting alcohol use. Practicing safe sex. Taking low doses of aspirin every day. Taking vitamin and mineral supplements as recommended by your health care provider. What happens during an annual well check? The services and screenings done by your health care provider during your annual well check will depend on your age, overall health, lifestyle risk factors, and family history of disease. Counseling  Your health care provider  may ask you questions about your: Alcohol use. Tobacco use. Drug use. Emotional well-being. Home and relationship well-being. Sexual activity. Eating habits. History of falls. Memory and ability to understand (cognition). Work and work Statistician. Screening  You may have the following tests or measurements: Height, weight, and BMI. Blood pressure. Lipid and cholesterol levels. These may be checked every 5 years, or more frequently if you are over 18 years old. Skin check. Lung cancer screening. You may have this screening every year starting at age 107 if you have a 30-pack-year history of smoking and currently smoke or have quit within the past 15 years. Fecal occult blood test (FOBT) of the stool. You may have this test every year starting at age 11. Flexible sigmoidoscopy or colonoscopy. You may have a sigmoidoscopy every 5 years or a colonoscopy every 10 years starting at age 53. Prostate cancer screening. Recommendations will vary depending on your family history and other risks. Hepatitis C blood test. Hepatitis B blood test. Sexually transmitted disease (STD) testing. Diabetes screening. This is done by checking your blood sugar (glucose) after you have not eaten for a while (fasting). You may have this done every 1-3 years. Abdominal aortic aneurysm (AAA) screening. You may need this if you are a current or former smoker. Osteoporosis. You may be screened starting at age 31 if you are at high risk. Talk with your health care provider about your test results, treatment options, and if necessary, the need for more tests. Vaccines  Your health care provider may recommend certain vaccines, such as: Influenza vaccine. This is recommended every year. Tetanus, diphtheria, and acellular pertussis (Tdap, Td) vaccine. You may need a Td booster every 10 years. Zoster vaccine. You may need this after age  60. Pneumococcal 13-valent conjugate (PCV13) vaccine. One dose is recommended after  age 66. Pneumococcal polysaccharide (PPSV23) vaccine. One dose is recommended after age 62. Talk to your health care provider about which screenings and vaccines you need and how often you need them. This information is not intended to replace advice given to you by your health care provider. Make sure you discuss any questions you have with your health care provider. Document Released: 03/23/2015 Document Revised: 11/14/2015 Document Reviewed: 12/26/2014 Elsevier Interactive Patient Education  2017 [place] Prevention in the Home Falls can cause injuries. They can happen to people of all ages. There are many things you can do to make your home safe and to help prevent falls. What can I do on the outside of my home? Regularly fix the edges of walkways and driveways and fix any cracks. Remove anything that might make you trip as you walk through a door, such as a raised step or threshold. Trim any bushes or trees on the path to your home. Use bright outdoor lighting. Clear any walking paths of anything that might make someone trip, such as rocks or tools. Regularly check to see if handrails are loose or broken. Make sure that both sides of any steps have handrails. Any raised decks and porches should have guardrails on the edges. Have any leaves, snow, or ice cleared regularly. Use sand or salt on walking paths during winter. Clean up any spills in your garage right away. This includes oil or grease spills. What can I do in the bathroom? Use night lights. Install grab bars by the toilet and in the tub and shower. Do not use towel bars as grab bars. Use non-skid mats or decals in the tub or shower. If you need to sit down in the shower, use a plastic, non-slip stool. Keep the floor dry. Clean up any water that spills on the floor as soon as it happens. Remove soap buildup in the tub or shower regularly. Attach bath mats securely with double-sided non-slip rug tape. Do not have  throw rugs and other things on the floor that can make you trip. What can I do in the bedroom? Use night lights. Make sure that you have a light by your bed that is easy to reach. Do not use any sheets or blankets that are too big for your bed. They should not hang down onto the floor. Have a firm chair that has side arms. You can use this for support while you get dressed. Do not have throw rugs and other things on the floor that can make you trip. What can I do in the kitchen? Clean up any spills right away. Avoid walking on wet floors. Keep items that you use a lot in easy-to-reach places. If you need to reach something above you, use a strong step stool that has a grab bar. Keep electrical cords out of the way. Do not use floor polish or wax that makes floors slippery. If you must use wax, use non-skid floor wax. Do not have throw rugs and other things on the floor that can make you trip. What can I do with my stairs? Do not leave any items on the stairs. Make sure that there are handrails on both sides of the stairs and use them. Fix handrails that are broken or loose. Make sure that handrails are as long as the stairways. Check any carpeting to make sure that it is firmly attached to the stairs. Fix  any carpet that is loose or worn. Avoid having throw rugs at the top or bottom of the stairs. If you do have throw rugs, attach them to the floor with carpet tape. Make sure that you have a light switch at the top of the stairs and the bottom of the stairs. If you do not have them, ask someone to add them for you. What else can I do to help prevent falls? Wear shoes that: Do not have high heels. Have rubber bottoms. Are comfortable and fit you well. Are closed at the toe. Do not wear sandals. If you use a stepladder: Make sure that it is fully opened. Do not climb a closed stepladder. Make sure that both sides of the stepladder are locked into place. Ask someone to hold it for you, if  possible. Clearly mark and make sure that you can see: Any grab bars or handrails. First and last steps. Where the edge of each step is. Use tools that help you move around (mobility aids) if they are needed. These include: Canes. Walkers. Scooters. Crutches. Turn on the lights when you go into a dark area. Replace any light bulbs as soon as they burn out. Set up your furniture so you have a clear path. Avoid moving your furniture around. If any of your floors are uneven, fix them. If there are any pets around you, be aware of where they are. Review your medicines with your doctor. Some medicines can make you feel dizzy. This can increase your chance of falling. Ask your doctor what other things that you can do to help prevent falls. This information is not intended to replace advice given to you by your health care provider. Make sure you discuss any questions you have with your health care provider. Document Released: 12/21/2008 Document Revised: 08/02/2015 Document Reviewed: 03/31/2014 Elsevier Interactive Patient Education  2017 [employer].

## 2021-03-11 ENCOUNTER — Other Ambulatory Visit: Payer: Self-pay | Admitting: Family Medicine

## 2021-03-11 DIAGNOSIS — J301 Allergic rhinitis due to pollen: Secondary | ICD-10-CM

## 2021-03-11 DIAGNOSIS — I10 Essential (primary) hypertension: Secondary | ICD-10-CM

## 2021-03-11 DIAGNOSIS — J4550 Severe persistent asthma, uncomplicated: Secondary | ICD-10-CM

## 2021-03-13 NOTE — Telephone Encounter (Signed)
Requested Prescriptions  Pending Prescriptions Disp Refills   ALLERGY RELIEF 180 MG tablet [Pharmacy Med Name: ALLERGY RELIEF 180 MG TAB] 30 tablet 2    Sig: TAKE (1) TABLET BY MOUTH EVERY DAY     Ear, Nose, and Throat:  Antihistamines Passed - 03/13/2021  3:05 AM      Passed - Valid encounter within last 12 months    Recent Outpatient Visits          2 months ago Elevated AST (SGOT)   [facility] Juline Patch, MD   7 months ago Essential (primary) hypertension   [facility] Juline Patch, MD   8 months ago New daily persistent headache   [facility] Juline Patch, MD   1 year ago Essential (primary) hypertension   [facility] Juline Patch, MD   1 year ago Essential (primary) hypertension   [facility] Juline Patch, MD              lisinopril-hydrochlorothiazide (ZESTORETIC) 10-12.5 MG tablet [Pharmacy Med Name: LISINOPRIL-HCTZ 10-12.5 MG TAB] 180 tablet 1    Sig: TAKE (1) TABLET BY MOUTH TWICE DAILY     Cardiovascular:  ACEI + Diuretic Combos Failed - 03/13/2021  3:05 AM      Failed - Na in normal range and within 180 days    Sodium  Date Value Ref Range Status  08/15/2020 133 (L) 134 - 144 mmol/L Final         Failed - K in normal range and within 180 days    Potassium  Date Value Ref Range Status  08/15/2020 4.4 3.5 - 5.2 mmol/L Final         Failed - Cr in normal range and within 180 days    Creatinine, Ser  Date Value Ref Range Status  08/15/2020 1.05 0.76 - 1.27 mg/dL Final         Failed - Ca in normal range and within 180 days    Calcium  Date Value Ref Range Status  08/15/2020 9.0 8.6 - 10.2 mg/dL Final         Failed - Last BP in normal range    BP Readings from Last 1 Encounters:  02/14/21 140/67         Passed - Patient is not pregnant      Passed - Valid encounter within last 6 months    Recent Outpatient Visits          2 months ago Elevated AST (SGOT)   [facility]  Juline Patch, MD   7 months ago Essential (primary) hypertension   [facility] Juline Patch, MD   8 months ago New daily persistent headache   [facility] Juline Patch, MD   1 year ago Essential (primary) hypertension   [facility] Juline Patch, MD   1 year ago Essential (primary) hypertension   [facility] Juline Patch, MD              montelukast (SINGULAIR) 10 MG tablet [Pharmacy Med Name: MONTELUKAST SODIUM 10 MG TAB] 90 tablet 1    Sig: TAKE (1) TABLET BY MOUTH EVERY DAY     Pulmonology:  Leukotriene Inhibitors Passed - 03/13/2021  3:05 AM      Passed - Valid encounter within last 12 months    Recent Outpatient Visits          2 months  ago Elevated AST (SGOT)   [facility] Juline Patch, MD   7 months ago Essential (primary) hypertension   [facility] Juline Patch, MD   8 months ago New daily persistent headache   [facility] Juline Patch, MD   1 year ago Essential (primary) hypertension   [facility] Juline Patch, MD   1 year ago Essential (primary) hypertension   [facility] Juline Patch, MD

## 2021-03-17 ENCOUNTER — Encounter: Payer: Self-pay | Admitting: Nurse Practitioner

## 2021-03-20 ENCOUNTER — Ambulatory Visit: Payer: Medicare Other | Admitting: Internal Medicine

## 2021-03-20 ENCOUNTER — Other Ambulatory Visit: Payer: Self-pay

## 2021-03-20 DIAGNOSIS — R0602 Shortness of breath: Secondary | ICD-10-CM | POA: Diagnosis not present

## 2021-03-20 DIAGNOSIS — J449 Chronic obstructive pulmonary disease, unspecified: Secondary | ICD-10-CM

## 2021-03-20 LAB — PULMONARY FUNCTION TEST

## 2021-03-26 NOTE — Procedures (Signed)
[facility] [address] [place], [address]    Complete Pulmonary Function Testing Interpretation:  FINDINGS:  The forced vital capacity is mildly decreased.  FEV1 is 1.88 L which is 65% of predicted and is mildly decreased.  FEV1 FVC ratio is mildly decreased.  Postbronchodilator there is no significant change in the FEV1 however clinical improvement may occur in the absence of spirometric improvement and does not preclude the use of bronchodilators.  Total lung capacity is mildly decreased.  Residual volume is normal.  Residual volume to lung capacity ratio is increased.  FRC is normal.  DLCO was within normal limits.  IMPRESSION:  This pulmonary function study is suggestive of mild obstructive lung disease clinical correlation is recommended  Allyne Gee, MD [facility] Pulmonary Critical Care Medicine Sleep Medicine

## 2021-04-12 DIAGNOSIS — G4733 Obstructive sleep apnea (adult) (pediatric): Secondary | ICD-10-CM | POA: Diagnosis not present

## 2021-04-19 DIAGNOSIS — H6123 Impacted cerumen, bilateral: Secondary | ICD-10-CM | POA: Diagnosis not present

## 2021-04-19 DIAGNOSIS — H6061 Unspecified chronic otitis externa, right ear: Secondary | ICD-10-CM | POA: Diagnosis not present

## 2021-04-23 ENCOUNTER — Encounter: Payer: Self-pay | Admitting: Internal Medicine

## 2021-06-05 ENCOUNTER — Other Ambulatory Visit: Payer: Self-pay | Admitting: Urology

## 2021-06-05 ENCOUNTER — Other Ambulatory Visit: Payer: Self-pay | Admitting: Family Medicine

## 2021-06-05 DIAGNOSIS — J4550 Severe persistent asthma, uncomplicated: Secondary | ICD-10-CM

## 2021-06-05 DIAGNOSIS — E7849 Other hyperlipidemia: Secondary | ICD-10-CM

## 2021-06-05 DIAGNOSIS — G8929 Other chronic pain: Secondary | ICD-10-CM

## 2021-06-05 DIAGNOSIS — I10 Essential (primary) hypertension: Secondary | ICD-10-CM

## 2021-06-07 ENCOUNTER — Other Ambulatory Visit: Payer: Self-pay

## 2021-06-07 ENCOUNTER — Telehealth: Payer: Self-pay

## 2021-06-07 DIAGNOSIS — J301 Allergic rhinitis due to pollen: Secondary | ICD-10-CM

## 2021-06-07 DIAGNOSIS — J4550 Severe persistent asthma, uncomplicated: Secondary | ICD-10-CM

## 2021-06-07 DIAGNOSIS — I10 Essential (primary) hypertension: Secondary | ICD-10-CM

## 2021-06-07 DIAGNOSIS — G8929 Other chronic pain: Secondary | ICD-10-CM

## 2021-06-07 DIAGNOSIS — E7849 Other hyperlipidemia: Secondary | ICD-10-CM

## 2021-06-07 MED ORDER — ATORVASTATIN CALCIUM 40 MG PO TABS: ORAL_TABLET | ORAL | 0 refills | Status: DC

## 2021-06-07 MED ORDER — VENTOLIN HFA 108 (90 BASE) MCG/ACT IN AERS: INHALATION_SPRAY | RESPIRATORY_TRACT | 0 refills | Status: DC

## 2021-06-07 MED ORDER — IBUPROFEN 800 MG PO TABS: ORAL_TABLET | ORAL | 0 refills | Status: DC

## 2021-06-07 MED ORDER — LISINOPRIL-HYDROCHLOROTHIAZIDE 10-12.5 MG PO TABS: ORAL_TABLET | ORAL | 0 refills | Status: DC

## 2021-06-07 MED ORDER — MONTELUKAST SODIUM 10 MG PO TABS: ORAL_TABLET | ORAL | 0 refills | Status: DC

## 2021-06-07 NOTE — Telephone Encounter (Signed)
Copied from [place] [id number]. Topic: General - Other ?>> Jun 07, 2021  3:22 PM Yvette Rack wrote: ?Reason for CRM: Pt requests that Baxter Flattery return his call at [phone] ?

## 2021-06-24 ENCOUNTER — Ambulatory Visit: Payer: Medicare Other | Admitting: Family Medicine

## 2021-06-26 ENCOUNTER — Ambulatory Visit: Payer: Medicare Other | Admitting: Family Medicine

## 2021-06-26 ENCOUNTER — Encounter: Payer: Self-pay | Admitting: Family Medicine

## 2021-06-26 VITALS — BP 130/76 | HR 76 | Ht 70.0 in | Wt 180.0 lb

## 2021-06-26 DIAGNOSIS — J301 Allergic rhinitis due to pollen: Secondary | ICD-10-CM

## 2021-06-26 DIAGNOSIS — J4550 Severe persistent asthma, uncomplicated: Secondary | ICD-10-CM

## 2021-06-26 DIAGNOSIS — M25512 Pain in left shoulder: Secondary | ICD-10-CM

## 2021-06-26 DIAGNOSIS — I10 Essential (primary) hypertension: Secondary | ICD-10-CM | POA: Diagnosis not present

## 2021-06-26 DIAGNOSIS — G8929 Other chronic pain: Secondary | ICD-10-CM

## 2021-06-26 DIAGNOSIS — E7849 Other hyperlipidemia: Secondary | ICD-10-CM | POA: Diagnosis not present

## 2021-06-26 MED ORDER — IBUPROFEN 800 MG PO TABS: ORAL_TABLET | ORAL | 0 refills | Status: DC

## 2021-06-26 MED ORDER — FEXOFENADINE HCL 180 MG PO TABS: ORAL_TABLET | ORAL | 1 refills | Status: DC

## 2021-06-26 MED ORDER — ROSUVASTATIN CALCIUM 5 MG PO TABS: 5.0000 mg | ORAL_TABLET | Freq: Every day | ORAL | 0 refills | Status: DC

## 2021-06-26 MED ORDER — EZETIMIBE 10 MG PO TABS: 10.0000 mg | ORAL_TABLET | Freq: Every day | ORAL | 0 refills | Status: DC

## 2021-06-26 MED ORDER — VENTOLIN HFA 108 (90 BASE) MCG/ACT IN AERS: INHALATION_SPRAY | RESPIRATORY_TRACT | 0 refills | Status: DC

## 2021-06-26 MED ORDER — LISINOPRIL-HYDROCHLOROTHIAZIDE 10-12.5 MG PO TABS: ORAL_TABLET | ORAL | 1 refills | Status: DC

## 2021-06-26 MED ORDER — MONTELUKAST SODIUM 10 MG PO TABS: ORAL_TABLET | ORAL | 1 refills | Status: DC

## 2021-06-26 NOTE — Patient Instructions (Signed)

## 2021-06-26 NOTE — Progress Notes (Signed)
? ? ?Date:  06/26/2021  ? ?Name:  Marcus Delacruz   DOB:  1940/09/18   MRN:  [id number] ? ? ?Chief Complaint: Allergic Rhinitis , Asthma, Hypertension, Hyperlipidemia, and Shoulder Pain (Takes ibuprofen) ? ?Asthma ?There is no chest tightness, cough, difficulty breathing, frequent throat clearing, hemoptysis, hoarse voice, shortness of breath, sputum production or wheezing. This is a chronic problem. The current episode started more than 1 year ago. The problem occurs intermittently. The problem has been waxing and waning. Associated symptoms include sneezing. Pertinent negatives include no appetite change, chest pain, dyspnea on exertion, ear congestion, ear pain, fever, headaches, heartburn, malaise/fatigue, myalgias, nasal congestion, orthopnea, PND, postnasal drip, rhinorrhea, sore throat, sweats, trouble swallowing or weight loss. His symptoms are aggravated by pollen and change in weather (eternal mowing). His past medical history is significant for asthma.  ?Hypertension ?The current episode started more than 1 year ago. The problem has been gradually improving since onset. The problem is controlled. Pertinent negatives include no chest pain, headaches, malaise/fatigue, neck pain, orthopnea, palpitations, PND, shortness of breath or sweats. Past treatments include ACE inhibitors and diuretics. The current treatment provides moderate improvement. There are no compliance problems.  There is no history of angina, kidney disease, CAD/MI, CVA, heart failure, left ventricular hypertrophy, PVD or retinopathy. There is no history of chronic renal disease, a hypertension causing med or renovascular disease.  ?Hyperlipidemia ?This is a chronic problem. The current episode started more than 1 year ago. He has no history of chronic renal disease. There are no known factors aggravating his hyperlipidemia. Pertinent negatives include no chest pain, focal sensory loss, focal weakness, leg pain, myalgias or shortness of  breath. The current treatment provides moderate improvement of lipids. There are no compliance problems.   ?Shoulder Pain  ?This is a chronic problem. The current episode started more than 1 year ago. The problem has been waxing and waning. Pertinent negatives include no fever.  ? ?Lab Results  ?Component Value Date  ? NA 133 (L) 08/15/2020  ? K 4.4 08/15/2020  ? CO2 24 08/15/2020  ? GLUCOSE 93 08/15/2020  ? BUN 15 08/15/2020  ? CREATININE 1.05 08/15/2020  ? CALCIUM 9.0 08/15/2020  ? EGFR 73 08/15/2020  ? GFRNONAA 49 (L) 02/20/2020  ? ?Lab Results  ?Component Value Date  ? CHOL 192 12/20/2020  ? HDL 58 12/20/2020  ? LDLCALC 111 (H) 12/20/2020  ? TRIG 129 12/20/2020  ? CHOLHDL 3.5 08/20/2017  ? ?No results found for: TSH ?No results found for: HGBA1C ?Lab Results  ?Component Value Date  ? WBC 10.8 12/20/2020  ? HGB 15.6 12/20/2020  ? HCT 46.5 12/20/2020  ? MCV 88 12/20/2020  ? PLT 312 12/20/2020  ? ?Lab Results  ?Component Value Date  ? ALT 40 12/20/2020  ? AST 69 (H) 12/20/2020  ? ALKPHOS 73 12/20/2020  ? BILITOT 0.6 12/20/2020  ? ?No results found for: 25OHVITD2, [place], VD25OH  ? ?Review of Systems  ?Constitutional:  Negative for appetite change, chills, fever, malaise/fatigue and weight loss.  ?HENT:  Positive for sneezing. Negative for drooling, ear discharge, ear pain, hoarse voice, postnasal drip, rhinorrhea, sore throat and trouble swallowing.   ?Respiratory:  Negative for cough, hemoptysis, sputum production, shortness of breath and wheezing.   ?Cardiovascular:  Negative for chest pain, dyspnea on exertion, palpitations, orthopnea, leg swelling and PND.  ?Gastrointestinal:  Negative for abdominal pain, blood in stool, constipation, diarrhea, heartburn and nausea.  ?Endocrine: Negative for polydipsia.  ?Genitourinary:  Negative for  dysuria, frequency, hematuria and urgency.  ?Musculoskeletal:  Negative for back pain, myalgias and neck pain.  ?Skin:  Negative for rash.  ?Allergic/Immunologic: Negative for  environmental allergies.  ?Neurological:  Negative for dizziness, focal weakness and headaches.  ?Hematological:  Does not bruise/bleed easily.  ?Psychiatric/Behavioral:  Negative for suicidal ideas. The patient is not nervous/anxious.   ? ?Patient Active Problem List  ? Diagnosis Date Noted  ? Chronic obstructive pulmonary disease with acute exacerbation ([place]) 07/27/2017  ? Asthma, chronic, severe persistent, uncomplicated 34/91/7915  ? Seasonal allergic rhinitis due to pollen 08/18/2016  ? Bilateral carotid artery stenosis 04/03/2016  ? Bilateral hydrocele 11/22/2014  ? 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 Epididymal cyst 10/05/2014  ? 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 Laboratory animal allergy 07/14/2014  ? Acute arthropathy 07/14/2014  ? Routine general medical examination at a health care facility 07/14/2014  ? Asthma with exacerbation 07/14/2014  ? Essential (primary) hypertension 07/14/2014  ? Screening for depression 07/14/2014  ? Noncompliance w/medication treatment due to intermit use of medication 11/23/2013  ? Asthma, chronic 08/08/2013  ? ? ?Allergies  ?Allergen Reactions  ? Codeine Itching  ? ? ?Past Surgical History:  ?Procedure Laterality Date  ? ARTERY REPAIR    ? BACK SURGERY    ? CHOLECYSTECTOMY    ? COLONOSCOPY  2013  ? cleared  ? HERNIA REPAIR    ? ? ?Social History  ? ?Tobacco Use  ? Smoking status: Former  ?  Packs/day: 1.00  ?  Years: 20.00  ?  Pack years: 20.00  ?  Types: Cigarettes  ?  Quit date: 08/09/1983  ?  Years since quitting: 37.9  ? Smokeless tobacco: Never  ? Tobacco comments:  ?  quit smoking over 40 years ago  ?Vaping Use  ? Vaping Use: Never used  ?Substance Use Topics  ? Alcohol use: Yes  ?  Alcohol/week: 0.0 standard drinks  ?  Comment: once monthly  ? Drug use: No  ? ? ? ?Medication list has been reviewed and updated. ? ?Current Meds  ?Medication Sig  ? ALLERGY RELIEF 180 MG tablet TAKE (1) TABLET BY MOUTH EVERY DAY  ?  aspirin 81 MG tablet Take 81 mg by mouth daily.  ? finasteride (PROSCAR) 5 MG tablet TAKE (1) TABLET BY MOUTH EVERY DAY  ? Fluticasone-Umeclidin-Vilant (TRELEGY ELLIPTA) 100-62.5-25 MCG/ACT AEPB Inhale 1 puff into the lungs daily.  ? ibuprofen (ADVIL) 800 MG tablet Take 1 tablet three times a day as needed  ? KETOCONAZOLE-HYDROCORTISONE EX Apply topically.  ? lisinopril-hydrochlorothiazide (ZESTORETIC) 10-12.5 MG tablet TAKE (1) TABLET BY MOUTH TWICE DAILY  ? montelukast (SINGULAIR) 10 MG tablet Take 1 tablet daily  ? NON FORMULARY cpap device  ? VENTOLIN HFA 108 (90 Base) MCG/ACT inhaler USE 2 PUFFS EVERY 6 HOURS AS NEEDED SHORTNESS OF BREATH OR WHEEZING  ? [DISCONTINUED] atorvastatin (LIPITOR) 40 MG tablet TAKE (1) TABLET BY MOUTH EVERY DAY  ? ?Current Facility-Administered Medications for the 06/26/21 encounter (Office Visit) with Juline Patch, MD  ?Medication  ? ipratropium-albuterol (DUONEB) 0.5-2.5 (3) MG/3ML nebulizer solution 3 mL  ? ipratropium-albuterol (DUONEB) 0.5-2.5 (3) MG/3ML nebulizer solution 3 mL  ? ? ? ?  06/26/2021  ? 11:12 AM 07/12/2020  ?  1:49 PM 09/06/2019  ?  9:34 AM 02/23/2019  ?  9:11 AM  ?GAD 7 : Generalized Anxiety Score  ?Nervous, Anxious, on Edge 0 1 0 0  ?Control/stop worrying 0 0 0 0  ?Worry  too much - different things 0 0 0 0  ?Trouble relaxing 0 0 0 0  ?Restless 0 0 0 0  ?Easily annoyed or irritable 0 0 0 0  ?Afraid - awful might happen 0 0 0 0  ?Total GAD 7 Score 0 1 0 0  ?Anxiety Difficulty Not difficult at all     ? ? ? ?  06/26/2021  ? 11:12 AM  ?Depression screen PHQ 2/9  ?Decreased Interest 0  ?Down, Depressed, Hopeless 0  ?PHQ - 2 Score 0  ?Altered sleeping 0  ?Tired, decreased energy 0  ?Change in appetite 0  ?Feeling bad or failure about yourself  0  ?Trouble concentrating 0  ?Moving slowly or fidgety/restless 0  ?Suicidal thoughts 0  ?PHQ-9 Score 0  ?Difficult doing work/chores Not difficult at all  ? ? ?BP Readings from Last 3 Encounters:  ?06/26/21 130/76  ?02/14/21 140/67   ?12/20/20 130/70  ? ? ?Physical Exam ?Vitals and nursing note reviewed.  ?HENT:  ?   Head: Normocephalic.  ?   Right Ear: Tympanic membrane, ear canal and external ear normal. There is no impacted cerum

## 2021-06-28 ENCOUNTER — Other Ambulatory Visit: Payer: Self-pay | Admitting: Urology

## 2021-06-30 NOTE — Progress Notes (Signed)
07/04/21 ?1:47 PM  ? ?Marcus Delacruz ?Nov 20, 1940 ?[id number] ? ?Referring provider:  ?Juline Patch, MD ?[address] ?[address] ?[place],  [place] [address] ? ?Chief Complaint  ?Patient presents with  ? Follow-up  ? Medication Refill  ? ? ?Urological history  ?BPH with LU TS ?-PSA 0.8 in 2019 - aged out of screening ?- IPSS 1/1 ?- Managed conservatively and with finasteride 5 mg daily  ? ? ?HPI: ?Marcus Delacruz is a 81 y.o.male who presents today for an office visit and refill on medications.  ? ?He has no complaints today.  Patient denies any modifying or aggravating factors.  Patient denies any gross hematuria, dysuria or suprapubic/flank pain.  Patient denies any fevers, chills, nausea or vomiting.  ? ? IPSS   ? ? [place] Name 07/01/21 1300  ?  ?  ?  ? International Prostate Symptom Score  ? How often have you had the sensation of not emptying your bladder? Not at All    ? How often have you had to urinate less than every two hours? Not at All    ? How often have you found you stopped and started again several times when you urinated? Not at All    ? How often have you found it difficult to postpone urination? Not at All    ? How often have you had a weak urinary stream? Not at All    ? How often have you had to strain to start urination? Not at All    ? How many times did you typically get up at night to urinate? 1 Time    ? Total IPSS Score 1    ?  ? Quality of Life due to urinary symptoms  ? If you were to spend the rest of your life with your urinary condition just the way it is now how would you feel about that? Pleased    ? ?  ?  ? ?  ? ? ?Score:  ?1-7 Mild ?8-19 Moderate ?20-35 Severe ? ? ? ? ?PMH: ?Past Medical History:  ?Diagnosis Date  ? Allergic rhinitis due to pollen   ? Asthma   ? BPH (benign prostatic hyperplasia)   ? Cardiomegaly   ? Chronic obstructive pulmonary disease (COPD) ([place])   ? Hypercholesteremia   ? Hypersomnia, unspecified   ? Hypertension   ? Obstructive sleep apnea (adult)  (pediatric)   ? Shortness of breath   ? Snoring   ? Solitary pulmonary nodule   ? ? ?Surgical History: ?Past Surgical History:  ?Procedure Laterality Date  ? ARTERY REPAIR    ? BACK SURGERY    ? CHOLECYSTECTOMY    ? COLONOSCOPY  2013  ? cleared  ? HERNIA REPAIR    ? ? ?Home Medications:  ?Allergies as of 07/01/2021   ? ?   Reactions  ? Codeine Itching  ? ?  ? ?  ?Medication List  ?  ? ?  ? Accurate as of July 01, 2021 11:59 PM. If you have any questions, ask your nurse or doctor.  ?  ?  ? ?  ? ?STOP taking these medications   ? ?aspirin 81 MG tablet ?Stopped by: Zara Council, PA-C ?  ?carbamide peroxide 6.5 % OTIC solution ?Commonly known as: DEBROX ?Stopped by: Zara Council, PA-C ?  ?KETOCONAZOLE-HYDROCORTISONE EX ?Stopped by: Zara Council, PA-C ?  ? ?  ? ?TAKE these medications   ? ?ezetimibe 10 MG tablet ?Commonly known as: Zetia ?Take  1 tablet (10 mg total) by mouth daily. ?  ?fexofenadine 180 MG tablet ?Commonly known as: Allergy Relief ?TAKE (1) TABLET BY MOUTH EVERY DAY ?  ?finasteride 5 MG tablet ?Commonly known as: PROSCAR ?TAKE (1) TABLET BY MOUTH EVERY DAY ?What changed: Another medication with the same name was added. Make sure you understand how and when to take each. ?Changed by: Zara Council, PA-C ?  ?finasteride 5 MG tablet ?Commonly known as: PROSCAR ?Take 1 tablet (5 mg total) by mouth daily. ?What changed: You were already taking a medication with the same name, and this prescription was added. Make sure you understand how and when to take each. ?Changed by: Zara Council, PA-C ?  ?Fluticasone-Umeclidin-Vilant 100-62.5-25 MCG/ACT Aepb ?Commonly known as: Trelegy Ellipta ?Inhale 1 puff into the lungs daily. ?  ?ibuprofen 800 MG tablet ?Commonly known as: ADVIL ?Take 1 tablet three times a day as needed ?  ?lisinopril-hydrochlorothiazide 10-12.5 MG tablet ?Commonly known as: ZESTORETIC ?TAKE (1) TABLET BY MOUTH TWICE DAILY ?  ?montelukast 10 MG tablet ?Commonly known as:  SINGULAIR ?Take 1 tablet daily ?  ?NON FORMULARY ?cpap device ?  ?rosuvastatin 5 MG tablet ?Commonly known as: Crestor ?Take 1 tablet (5 mg total) by mouth daily. ?  ?Ventolin HFA 108 (90 Base) MCG/ACT inhaler ?Generic drug: albuterol ?USE 2 PUFFS EVERY 6 HOURS AS NEEDED SHORTNESS OF BREATH OR WHEEZING ?  ? ?  ? ? ?Allergies:  ?Allergies  ?Allergen Reactions  ? Codeine Itching  ? ? ?Family History: ?Family History  ?Problem Relation Age of Onset  ? COPD Mother   ? Cancer Father   ?     lung  ? Asthma Son   ? Prostate cancer Neg Hx   ? Kidney disease Neg Hx   ? ? ?Social History:  reports that he quit smoking about 37 years ago. His smoking use included cigarettes. He has a 20.00 pack-year smoking history. He has been exposed to tobacco smoke. He has never used smokeless tobacco. He reports current alcohol use. He reports that he does not use drugs. ? ? ?Physical Exam: ?BP 129/73   Pulse 70   Ht 5' 10"  (1.778 m)   Wt 180 lb (81.6 kg)   BMI 25.83 kg/m?   ?Constitutional:  Alert and oriented, No acute distress. ?HEENT: [place] AT, moist mucus membranes.  Trachea midline, no masses. ?Cardiovascular: No clubbing, cyanosis, or edema. ?Respiratory: Normal respiratory effort, no increased work of breathing. ?Neurologic: Grossly intact, no focal deficits, moving all 4 extremities. ?Psychiatric: Normal mood and affect. ? ?Laboratory Data: ?Lab Results  ?Component Value Date  ? CREATININE 1.05 08/15/2020  ? ?Component ?    Latest Ref Rng 12/20/2020  ?Total Bilirubin ?    0.0 - 1.2 mg/dL 0.6   ?Alkaline Phosphatase ?    44 - 121 IU/L 73   ?AST ?    0 - 40 IU/L 69 (H)   ?ALT ?    0 - 44 IU/L 40   ?Glucose ?    65 - 99 mg/dL   ?BUN ?    8 - 27 mg/dL   ?Creatinine ?    0.76 - 1.27 mg/dL   ?BUN/Creatinine Ratio ?    10 - 24    ?Sodium ?    134 - 144 mmol/L   ?Potassium ?    3.5 - 5.2 mmol/L   ?Chloride ?    96 - 106 mmol/L   ?CO2 ?    20 - 29  mmol/L   ?Calcium ?    8.6 - 10.2 mg/dL   ?Albumin ?    3.7 - 4.7 g/dL 4.6   ?Total  Protein ?    6.0 - 8.5 g/dL 7.2   ?Globulin, Total ?    1.5 - 4.5 g/dL   ?Albumin/Globulin Ratio ?    1.2 - 2.2    ?eGFR ?    >59 mL/min/1.73   ?  ?(H) High ?Component ?    Latest Ref Rng 12/20/2020  ?WBC ?    3.4 - 10.8 x10E3/uL 10.8   ?RBC ?    4.14 - 5.80 x10E6/uL 5.27   ?Hemoglobin ?    13.0 - 17.7 g/dL 15.6   ?HCT ?    37.5 - 51.0 % 46.5   ?MCV ?    79 - 97 fL 88   ?MCH ?    26.6 - 33.0 pg 29.6   ?MCHC ?    31.5 - 35.7 g/dL 33.5   ?RDW ?    11.6 - 15.4 % 11.9   ?Platelets ?    150 - 450 x10E3/uL 312   ?Neutrophils ?    Not Estab. % 78   ?Lymphs ?    Not Estab. % 10   ?Monocytes ?    Not Estab. % 9   ?Eos ?    Not Estab. % 2   ?Basos ?    Not Estab. % 1   ?NEUT# ?    1.4 - 7.0 x10E3/uL 8.4 (H)   ?Lymphocyte # ?    0.7 - 3.1 x10E3/uL 1.1   ?Monocytes Absolute ?    0.1 - 0.9 x10E3/uL 1.0 (H)   ?EOS (ABSOLUTE) ?    0.0 - 0.4 x10E3/uL 0.2   ?Basophils Absolute ?    0.0 - 0.2 x10E3/uL 0.1   ?Immature Granulocytes ?    Not Estab. % 0   ?Immature Grans (Abs) ?    0.0 - 0.1 x10E3/uL 0.0   ?  ?(H) High ?I have reviewed the labs.  ? ?Pertinent Imaging: ?No recent imaging ? ?Assessment & Plan:   ? ?BPH with LUTS ?- Continue conservative management, avoiding bladder irritants and timed voiding's ?- He will continue finasteride 5 mg daily ?- Finasteride; refilled  ? ?Return in about 1 year (around 07/02/2022) for I PSS . ? ?[place] ?[address], [address] ?[place], [place] [address] ?[phone] ? ?I, Kailey Littlejohn,acting as a scribe for [employer], PA-C.,have documented all relevant documentation on the behalf of Jaedon Siler, PA-C,as directed by  [facility], PA-C while in the presence of [place], PA-C. ? ?I have reviewed the above documentation for accuracy and completeness, and I agree with the above.   ? ?Zara Council, PA-C  ?

## 2021-07-01 ENCOUNTER — Ambulatory Visit (INDEPENDENT_AMBULATORY_CARE_PROVIDER_SITE_OTHER): Payer: Medicare Other | Admitting: Urology

## 2021-07-01 ENCOUNTER — Encounter: Payer: Self-pay | Admitting: Urology

## 2021-07-01 VITALS — BP 129/73 | HR 70 | Ht 70.0 in | Wt 180.0 lb

## 2021-07-01 DIAGNOSIS — N138 Other obstructive and reflux uropathy: Secondary | ICD-10-CM

## 2021-07-01 DIAGNOSIS — N401 Enlarged prostate with lower urinary tract symptoms: Secondary | ICD-10-CM

## 2021-07-01 MED ORDER — FINASTERIDE 5 MG PO TABS: 5.0000 mg | ORAL_TABLET | Freq: Every day | ORAL | 3 refills | Status: DC

## 2021-07-02 ENCOUNTER — Encounter: Payer: Self-pay | Admitting: Family Medicine

## 2021-07-02 ENCOUNTER — Ambulatory Visit: Payer: Self-pay

## 2021-07-02 ENCOUNTER — Ambulatory Visit: Payer: Medicare Other | Admitting: Family Medicine

## 2021-07-02 VITALS — BP 120/80 | HR 64 | Ht 70.0 in | Wt 179.0 lb

## 2021-07-02 DIAGNOSIS — J4520 Mild intermittent asthma, uncomplicated: Secondary | ICD-10-CM | POA: Diagnosis not present

## 2021-07-02 DIAGNOSIS — J441 Chronic obstructive pulmonary disease with (acute) exacerbation: Secondary | ICD-10-CM

## 2021-07-02 MED ORDER — IPRATROPIUM-ALBUTEROL 0.5-2.5 (3) MG/3ML IN SOLN: 3.0000 mL | Freq: Four times a day (QID) | RESPIRATORY_TRACT | 1 refills | Status: DC | PRN

## 2021-07-02 MED ORDER — PREDNISONE 10 MG PO TABS: 10.0000 mg | ORAL_TABLET | Freq: Every day | ORAL | 0 refills | Status: DC

## 2021-07-02 NOTE — Telephone Encounter (Signed)
? ?  Chief Complaint: Wheezing with asthma ?Symptoms: Chest tightness ?Frequency: Started 2-3 days ago ?Pertinent Negatives: Patient denies fever ?Disposition: '[]'$ ED /'[]'$ Urgent Care (no appt availability in office) / '[x]'$ Appointment(In office/virtual)/ '[]'$  [place] [facility]/ '[]'$ Home Care/ '[]'$ Refused Recommended Disposition /'[]'$ [place] [facility]/ '[]'$  Follow-up with PCP ?Additional Notes:   ?Reason for Disposition ? [1] MILD asthma attack (e.g., no SOB at rest, mild SOB with walking, speaks normally in sentences, mild wheezing) AND [2] lasting > 24 hours on prescribed treatment ? ?Answer Assessment - Initial Assessment Questions ?1. RESPIRATORY STATUS: "Describe your breathing?" (e.g., wheezing, shortness of breath, unable to speak, severe coughing)  ?    SOB ?2. ONSET: "When did this asthma attack begin?"  ?    This weekend ?3. TRIGGER: "What do you think triggered this attack?" (e.g., URI, exposure to pollen or other allergen, tobacco smoke)  ?    Rain ?4. PEAK EXPIRATORY FLOW RATE (PEFR): "Do you use a peak flow meter?" If Yes, ask: "What's the current peak flow? What's your personal best peak flow?"  ?    No ?5. SEVERITY: "How bad is this attack?"  ?  - MILD: No SOB at rest, mild SOB with walking, speaks normally in sentences, can lie down, no retractions, pulse < 100. (GREEN Zone: PEFR 80-100%) ?  - MODERATE: SOB at rest, SOB with minimal exertion and prefers to sit, cannot lie down flat, speaks in phrases, mild retractions, audible wheezing, pulse 100-120. (YELLOW Zone: PEFR 50-79%)  ?  - SEVERE: Struggling for each breath, speaks in single words, struggling to breathe, sitting hunched forward, retractions, usually loud wheezing, sometimes minimal wheezing because of decreased air movement, pulse > 120. (RED Zone: PEFR < 50%).  ?    Inhaler ?6. ASTHMA MEDICINES:  "What treatments have you given?"  ?  - INHALED QUICK RELIEF (RESCUE): "What is your inhaled quick-relief medicine?" (e.g., albuterol, salbutamol)  "Do you use an inhaler or a nebulizer?" "How frequently have you been using?" ?  - CONTROLLER (LONG-TERM-CONTROL): "Do you take an inhaled steroid? (e.g., Asmanex, Flovent, Pulmicort, Qvar) ?    Inhaler ?7. INHALED QUICK-RELIEF TREATMENTS FOR THIS ATTACK: "What treatments have you given yourself so far?" and "How many and how often?" If using an inhaler, ask, "How many puffs?" Note: Routine treatments are 2 puffs every 4 hours as needed. Rescue treatments are 4 puffs repeated every 20 minutes, up to three times as needed.  ?    Inhaler ?8. OTHER SYMPTOMS: "Do you have any other symptoms? (e.g., chest pain, coughing up yellow sputum, fever, runny nose) ?    Chest tight ?9. O2 SATURATION MONITOR:  "Do you use an oxygen saturation monitor (pulse oximeter) at home?" If Yes, "What is your reading (oxygen level) today?" "What is your usual oxygen saturation reading?" (e.g., 95%) ?    No ?10. PREGNANCY: "Is there any chance you are pregnant?" "When was your last menstrual period?" ?      N/a ? ?Protocols used: Asthma Attack-A-AH ? ?

## 2021-07-02 NOTE — Progress Notes (Signed)
? ? ?Date:  07/02/2021  ? ?Name:  Marcus Delacruz   DOB:  Mar 17, 1940   MRN:  [id number] ? ? ?Chief Complaint: Shortness of Breath and Asthma (Out in rain Saturday putting gutters on) ? ?Shortness of Breath ?This is a recurrent problem. The current episode started in the past 7 days (onset saturday). The problem occurs constantly. The problem has been waxing and waning. Associated symptoms include sputum production and wheezing. Pertinent negatives include no abdominal pain, chest pain, ear pain, fever, headaches, hemoptysis, leg pain, leg swelling, neck pain, orthopnea, PND, rash, rhinorrhea or sore throat. The symptoms are aggravated by weather changes. He has tried beta agonist inhalers, steroid inhalers and ipratropium inhalers for the symptoms. The treatment provided mild relief. His past medical history is significant for asthma and COPD.  ?Asthma ?He complains of shortness of breath, sputum production and wheezing. There is no cough or hemoptysis. Pertinent negatives include no chest pain, ear pain, fever, headaches, myalgias, PND, rhinorrhea or sore throat. His past medical history is significant for asthma and COPD.  ? ?Lab Results  ?Component Value Date  ? NA 133 (L) 08/15/2020  ? K 4.4 08/15/2020  ? CO2 24 08/15/2020  ? GLUCOSE 93 08/15/2020  ? BUN 15 08/15/2020  ? CREATININE 1.05 08/15/2020  ? CALCIUM 9.0 08/15/2020  ? EGFR 73 08/15/2020  ? GFRNONAA 49 (L) 02/20/2020  ? ?Lab Results  ?Component Value Date  ? CHOL 192 12/20/2020  ? HDL 58 12/20/2020  ? LDLCALC 111 (H) 12/20/2020  ? TRIG 129 12/20/2020  ? CHOLHDL 3.5 08/20/2017  ? ?No results found for: TSH ?No results found for: HGBA1C ?Lab Results  ?Component Value Date  ? WBC 10.8 12/20/2020  ? HGB 15.6 12/20/2020  ? HCT 46.5 12/20/2020  ? MCV 88 12/20/2020  ? PLT 312 12/20/2020  ? ?Lab Results  ?Component Value Date  ? ALT 40 12/20/2020  ? AST 69 (H) 12/20/2020  ? ALKPHOS 73 12/20/2020  ? BILITOT 0.6 12/20/2020  ? ?No results found for: 25OHVITD2,  [place], VD25OH  ? ?Review of Systems  ?Constitutional:  Negative for chills and fever.  ?HENT:  Negative for drooling, ear discharge, ear pain, rhinorrhea and sore throat.   ?Respiratory:  Positive for sputum production, shortness of breath and wheezing. Negative for cough and hemoptysis.   ?Cardiovascular:  Negative for chest pain, palpitations, orthopnea, leg swelling and PND.  ?Gastrointestinal:  Negative for abdominal pain, blood in stool, constipation, diarrhea and nausea.  ?Endocrine: Negative for polydipsia.  ?Genitourinary:  Negative for dysuria, frequency, hematuria and urgency.  ?Musculoskeletal:  Negative for back pain, myalgias and neck pain.  ?Skin:  Negative for rash.  ?Allergic/Immunologic: Negative for environmental allergies.  ?Neurological:  Negative for dizziness and headaches.  ?Hematological:  Does not bruise/bleed easily.  ?Psychiatric/Behavioral:  Negative for suicidal ideas. The patient is not nervous/anxious.   ? ?Patient Active Problem List  ? Diagnosis Date Noted  ? Chronic obstructive pulmonary disease with acute exacerbation ([place]) 07/27/2017  ? Asthma, chronic, severe persistent, uncomplicated 42/87/6811  ? Seasonal allergic rhinitis due to pollen 08/18/2016  ? Bilateral carotid artery stenosis 04/03/2016  ? Bilateral hydrocele 11/22/2014  ? 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 Epididymal cyst 10/05/2014  ? 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 Laboratory animal allergy 07/14/2014  ? Acute arthropathy 07/14/2014  ? Routine general medical examination at a health care facility 07/14/2014  ? Asthma with exacerbation 07/14/2014  ? Essential (primary) hypertension 07/14/2014  ?  Screening for depression 07/14/2014  ? Noncompliance w/medication treatment due to intermit use of medication 11/23/2013  ? Asthma, chronic 08/08/2013  ? ? ?Allergies  ?Allergen Reactions  ? Codeine Itching  ? ? ?Past Surgical History:  ?Procedure Laterality Date   ? ARTERY REPAIR    ? BACK SURGERY    ? CHOLECYSTECTOMY    ? COLONOSCOPY  2013  ? cleared  ? HERNIA REPAIR    ? ? ?Social History  ? ?Tobacco Use  ? Smoking status: Former  ?  Packs/day: 1.00  ?  Years: 20.00  ?  Pack years: 20.00  ?  Types: Cigarettes  ?  Quit date: 08/09/1983  ?  Years since quitting: 37.9  ?  Passive exposure: Past  ? Smokeless tobacco: Never  ? Tobacco comments:  ?  quit smoking over 40 years ago  ?Vaping Use  ? Vaping Use: Never used  ?Substance Use Topics  ? Alcohol use: Yes  ?  Alcohol/week: 0.0 standard drinks  ?  Comment: once monthly  ? Drug use: No  ? ? ? ?Medication list has been reviewed and updated. ? ?No outpatient medications have been marked as taking for the 07/02/21 encounter (Office Visit) with Juline Patch, MD.  ? ?Current Facility-Administered Medications for the 07/02/21 encounter (Office Visit) with Juline Patch, MD  ?Medication  ? ipratropium-albuterol (DUONEB) 0.5-2.5 (3) MG/3ML nebulizer solution 3 mL  ? ipratropium-albuterol (DUONEB) 0.5-2.5 (3) MG/3ML nebulizer solution 3 mL  ? ? ? ?  07/02/2021  ? 11:03 AM 06/26/2021  ? 11:12 AM 07/12/2020  ?  1:49 PM 09/06/2019  ?  9:34 AM  ?GAD 7 : Generalized Anxiety Score  ?Nervous, Anxious, on Edge 0 0 1 0  ?Control/stop worrying 0 0 0 0  ?Worry too much - different things 0 0 0 0  ?Trouble relaxing 0 0 0 0  ?Restless 0 0 0 0  ?Easily annoyed or irritable 0 0 0 0  ?Afraid - awful might happen 0 0 0 0  ?Total GAD 7 Score 0 0 1 0  ?Anxiety Difficulty Not difficult at all Not difficult at all    ? ? ? ?  07/02/2021  ? 11:02 AM  ?Depression screen PHQ 2/9  ?Decreased Interest 0  ?Down, Depressed, Hopeless 0  ?PHQ - 2 Score 0  ?Altered sleeping 0  ?Tired, decreased energy 1  ?Change in appetite 0  ?Feeling bad or failure about yourself  0  ?Trouble concentrating 0  ?Moving slowly or fidgety/restless 0  ?Suicidal thoughts 0  ?PHQ-9 Score 1  ?Difficult doing work/chores Not difficult at all  ? ? ?BP Readings from Last 3 Encounters:  ?07/02/21  120/80  ?07/01/21 129/73  ?06/26/21 130/76  ? ? ?Physical Exam ?Vitals and nursing note reviewed.  ?HENT:  ?   Head: Normocephalic.  ?   Right Ear: External ear normal.  ?   Left Ear: External ear normal.  ?   Nose: Nose normal.  ?   Mouth/Throat:  ?   Mouth: Mucous membranes are moist.  ?Eyes:  ?   General: No scleral icterus.    ?   Right eye: No discharge.     ?   Left eye: No discharge.  ?   Conjunctiva/sclera: Conjunctivae normal.  ?   Pupils: Pupils are equal, round, and reactive to light.  ?Neck:  ?   Thyroid: No thyromegaly.  ?   Vascular: No hepatojugular reflux or JVD.  ?   Trachea:  No tracheal deviation.  ?Cardiovascular:  ?   Rate and Rhythm: Normal rate and regular rhythm.  ?   Heart sounds: Normal heart sounds. No murmur heard. ?  No friction rub. No gallop.  ?Pulmonary:  ?   Effort: No respiratory distress.  ?   Breath sounds: Normal breath sounds. Decreased air movement present. No decreased breath sounds, wheezing, rhonchi or rales.  ?Abdominal:  ?   General: Bowel sounds are normal.  ?   Palpations: Abdomen is soft. There is no hepatomegaly, splenomegaly or mass.  ?   Tenderness: There is no abdominal tenderness. There is no guarding or rebound.  ?Musculoskeletal:     ?   General: No tenderness. Normal range of motion.  ?   Cervical back: Normal range of motion and neck supple.  ?Lymphadenopathy:  ?   Cervical: No cervical adenopathy.  ?Skin: ?   General: Skin is warm.  ?   Findings: No rash.  ?Neurological:  ?   Mental Status: He is alert and oriented to person, place, and time.  ?   Cranial Nerves: No cranial nerve deficit.  ?   Deep Tendon Reflexes: Reflexes are normal and symmetric.  ? ? ?Wt Readings from Last 3 Encounters:  ?07/02/21 179 lb (81.2 kg)  ?07/01/21 180 lb (81.6 kg)  ?06/26/21 180 lb (81.6 kg)  ? ? ?BP 120/80   Pulse 64   Ht _0  (1.778 m)   Wt 179 lb (81.2 kg)   SpO2 97%   BMI 25.68 kg/m?  ? ?Assessment and Plan: ? ?1. Chronic obstructive pulmonary disease with acute  exacerbation ([place]) ?Chronic.  Acute exacerbation.  Relatively stable at this point.  Course of the disease is worsening when he was and change of weather situation.  Patient will continue his Trelegy but will au

## 2021-07-11 DIAGNOSIS — G4733 Obstructive sleep apnea (adult) (pediatric): Secondary | ICD-10-CM | POA: Diagnosis not present

## 2021-07-15 ENCOUNTER — Ambulatory Visit: Payer: Medicare Other | Admitting: Urology

## 2021-08-02 ENCOUNTER — Ambulatory Visit: Payer: Medicare Other | Admitting: Nurse Practitioner

## 2021-08-07 ENCOUNTER — Ambulatory Visit: Payer: Medicare Other | Admitting: Family Medicine

## 2021-08-14 ENCOUNTER — Ambulatory Visit: Payer: Medicare Other | Admitting: Nurse Practitioner

## 2021-08-15 ENCOUNTER — Other Ambulatory Visit: Payer: Self-pay | Admitting: Family Medicine

## 2021-08-15 DIAGNOSIS — J4550 Severe persistent asthma, uncomplicated: Secondary | ICD-10-CM

## 2021-08-21 ENCOUNTER — Ambulatory Visit: Payer: Self-pay | Admitting: Surgery

## 2021-08-22 ENCOUNTER — Ambulatory Visit: Payer: Medicare Other | Admitting: Family Medicine

## 2021-08-22 ENCOUNTER — Encounter: Payer: Self-pay | Admitting: Family Medicine

## 2021-08-22 VITALS — BP 138/72 | HR 78 | Ht 70.0 in | Wt 178.0 lb

## 2021-08-22 DIAGNOSIS — E7849 Other hyperlipidemia: Secondary | ICD-10-CM | POA: Diagnosis not present

## 2021-08-22 DIAGNOSIS — J441 Chronic obstructive pulmonary disease with (acute) exacerbation: Secondary | ICD-10-CM

## 2021-08-22 DIAGNOSIS — R69 Illness, unspecified: Secondary | ICD-10-CM | POA: Diagnosis not present

## 2021-08-22 NOTE — Progress Notes (Signed)
Date:  08/22/2021   Name:  Marcus Delacruz   DOB:  [birth year]   MRN:  [id number]   Chief Complaint: Hyperlipidemia (Changed from lovastatin to rosuvastatin and zetia)  Hyperlipidemia This is a chronic problem. The current episode started more than 1 year ago. The problem is controlled. Recent lipid tests were reviewed and are normal. He has no history of chronic renal disease, diabetes, hypothyroidism, liver disease, obesity or nephrotic syndrome. Associated symptoms include shortness of breath. Pertinent negatives include no chest pain, focal sensory loss, focal weakness, leg pain or myalgias. Current antihyperlipidemic treatment includes statins and ezetimibe. The current treatment provides moderate improvement of lipids. There are no compliance problems.   Shortness of Breath This is a chronic problem. The current episode started more than 1 year ago. The problem occurs intermittently. The problem has been waxing and waning. Associated symptoms include sputum production. Pertinent negatives include no chest pain, hemoptysis, leg pain, leg swelling, PND or wheezing. He has tried beta agonist inhalers for the symptoms. The treatment provided mild relief.    Lab Results  Component Value Date   NA 133 (L) 08/15/2020   K 4.4 08/15/2020   CO2 24 08/15/2020   GLUCOSE 93 08/15/2020   BUN 15 08/15/2020   CREATININE 1.05 08/15/2020   CALCIUM 9.0 08/15/2020   EGFR 73 08/15/2020   GFRNONAA 49 (L) 02/20/2020   Lab Results  Component Value Date   CHOL 192 12/20/2020   HDL 58 12/20/2020   LDLCALC 111 (H) 12/20/2020   TRIG 129 12/20/2020   CHOLHDL 3.5 08/20/2017   No results found for: "TSH" No results found for: "HGBA1C" Lab Results  Component Value Date   WBC 10.8 12/20/2020   HGB 15.6 12/20/2020   HCT 46.5 12/20/2020   MCV 88 12/20/2020   PLT 312 12/20/2020   Lab Results  Component Value Date   ALT 40 12/20/2020   AST 69 (H) 12/20/2020   ALKPHOS 73 12/20/2020   BILITOT  0.6 12/20/2020   No results found for: "25OHVITD2", "25OHVITD3", "VD25OH"   Review of Systems  Respiratory:  Positive for cough, sputum production and shortness of breath. Negative for hemoptysis, chest tightness and wheezing.   Cardiovascular:  Negative for chest pain, palpitations, leg swelling and PND.  Musculoskeletal:  Negative for myalgias.  Neurological:  Negative for focal weakness.    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   Laboratory animal allergy 07/14/2014   Acute arthropathy 07/14/2014   Routine general medical examination at a health care facility 07/14/2014   Asthma with exacerbation 07/14/2014   Essential (primary) hypertension 07/14/2014   Screening for depression 07/14/2014   Noncompliance w/medication treatment due to intermit use of medication 11/23/2013   Asthma, chronic 08/08/2013    Allergies  Allergen Reactions   Codeine Itching    Past Surgical History:  Procedure Laterality Date   ARTERY REPAIR     BACK SURGERY     CHOLECYSTECTOMY     COLONOSCOPY  2013   cleared   HERNIA REPAIR      Social History   Tobacco Use   Smoking status:  Former    Packs/day: 1.00    Years: 20.00    Total pack years: 20.00    Types: Cigarettes    Quit date: 08/09/1983    Years since quitting: 38.0    Passive exposure: Past   Smokeless tobacco: Never   Tobacco comments:    quit smoking over 40 years ago  Vaping Use   Vaping Use: Never used  Substance Use Topics   Alcohol use: Yes    Alcohol/week: 0.0 standard drinks of  alcohol    Comment: once monthly   Drug use: No     Medication list has been reviewed and updated.  Current Meds  Medication Sig   ezetimibe (ZETIA) 10 MG tablet Take 1 tablet (10 mg total) by mouth daily.   fexofenadine (ALLERGY RELIEF) 180 MG tablet TAKE (1) TABLET BY MOUTH EVERY DAY   finasteride (PROSCAR) 5 MG tablet TAKE (1) TABLET BY MOUTH EVERY DAY   finasteride (PROSCAR) 5 MG tablet Take 1 tablet (5 mg total) by mouth daily.   Fluticasone-Umeclidin-Vilant (TRELEGY ELLIPTA) 100-62.5-25 MCG/ACT AEPB Inhale 1 puff into the lungs daily.   ibuprofen (ADVIL) 800 MG tablet Take 1 tablet three times a day as needed   ipratropium-albuterol (DUONEB) 0.5-2.5 (3) MG/3ML SOLN Take 3 mLs by nebulization every 6 (six) hours as needed.   lisinopril-hydrochlorothiazide (ZESTORETIC) 10-12.5 MG tablet TAKE (1) TABLET BY MOUTH TWICE DAILY   montelukast (SINGULAIR) 10 MG tablet Take 1 tablet daily   NON FORMULARY cpap device   rosuvastatin (CRESTOR) 5 MG tablet Take 1 tablet (5 mg total) by mouth daily.   VENTOLIN HFA 108 (90 Base) MCG/ACT inhaler USE 2 PUFFS EVERY 6 HOURS AS NEEDED FOR SHORTNESS OF BREATH OR WHEEZING   Current Facility-Administered Medications for the 08/22/21 encounter (Office Visit) with Juline Patch, MD  Medication   ipratropium-albuterol (DUONEB) 0.5-2.5 (3) MG/3ML nebulizer solution 3 mL   ipratropium-albuterol (DUONEB) 0.5-2.5 (3) MG/3ML nebulizer solution 3 mL       07/02/2021   11:03 AM 06/26/2021   11:12 AM 07/12/2020    1:49 PM 09/06/2019    9:34 AM  GAD 7 : Generalized Anxiety Score  Nervous, Anxious, on Edge 0 0 1 0  Control/stop worrying 0 0 0 0  Worry too much - different things 0 0 0 0  Trouble relaxing 0 0 0 0  Restless 0 0 0 0  Easily annoyed or irritable 0 0 0 0  Afraid - awful might happen 0 0 0 0  Total GAD 7 Score 0 0 1 0  Anxiety Difficulty Not difficult at all Not difficult at all         07/02/2021   11:02 AM  Depression screen PHQ 2/9   Decreased Interest 0  Down, Depressed, Hopeless 0  PHQ - 2 Score 0  Altered sleeping 0  Tired, decreased energy 1  Change in appetite 0  Feeling bad or failure about yourself  0  Trouble concentrating 0  Moving slowly or fidgety/restless 0  Suicidal thoughts 0  PHQ-9 Score 1  Difficult doing work/chores Not difficult at all    BP Readings from Last 3 Encounters:  08/22/21 (!) 146/72  07/02/21 120/80  07/01/21 129/73    Physical Exam Vitals and nursing note reviewed.  HENT:     Head: Normocephalic.     Right Ear: External ear normal.     Left Ear: External ear normal.     Nose: Nose normal.  Eyes:  General: No scleral icterus.       Right eye: No discharge.        Left eye: No discharge.     Conjunctiva/sclera: Conjunctivae normal.     Pupils: Pupils are equal, round, and reactive to light.  Neck:     Thyroid: No thyromegaly.     Vascular: No JVD.     Trachea: No tracheal deviation.  Cardiovascular:     Rate and Rhythm: Normal rate and regular rhythm.     Pulses: Normal pulses.          Radial pulses are 2+ on the right side and 2+ on the left side.     Heart sounds: Normal heart sounds, S1 normal and S2 normal. No murmur heard.    No systolic murmur is present.     No diastolic murmur is present.     No friction rub. No gallop. No S3 or S4 sounds.  Pulmonary:     Effort: No respiratory distress.     Breath sounds: Normal breath sounds. No decreased air movement. No decreased breath sounds, wheezing, rhonchi or rales.  Abdominal:     General: Bowel sounds are normal.     Palpations: Abdomen is soft. There is no mass.     Tenderness: There is no abdominal tenderness. There is no guarding or rebound.  Musculoskeletal:        General: No tenderness. Normal range of motion.     Cervical back: Normal range of motion and neck supple.     Right lower leg: No edema.     Left lower leg: No edema.  Lymphadenopathy:     Cervical: No cervical adenopathy.  Skin:     General: Skin is warm.     Findings: No rash.  Neurological:     Mental Status: He is alert and oriented to person, place, and time.     Cranial Nerves: No cranial nerve deficit.     Deep Tendon Reflexes: Reflexes are normal and symmetric.     Wt Readings from Last 3 Encounters:  08/22/21 178 lb (80.7 kg)  07/02/21 179 lb (81.2 kg)  07/01/21 180 lb (81.6 kg)    BP (!) 146/72   Pulse 78   Ht 5' 10"  (1.778 m)   Wt 178 lb (80.7 kg)   SpO2 94%   BMI 25.54 kg/m   Assessment and Plan:  1. Familial multiple lipoprotein-type hyperlipidemia Chronic.  Controlled.  Stable.  Last visit we decided to add Zetia in addition to changing to Crestor 5 mg once a day.  We will check lipid panel at this time with hepatic function to assure that there is no hepatic effect of the statin. - Lipid Panel With LDL/HDL Ratio - Hepatic function panel  2. Chronic obstructive pulmonary disease with acute exacerbation ([facility]) Chronic.  Intermittent.  Relatively stable.  But patient is fatigued and has shortness of breath.  He has taken his Trelegy on a once a day basis however he is not taking his albuterol on a regular and patient has been told that he needs to take this in tandem at least 1 puff every 6 hours along with the Trelegy.  Patient will resume this on a regular basis and will recheck in several weeks  3. Taking medication for chronic disease As noted patient is on statin and we will check hepatic panel for liver function - Hepatic function panel

## 2021-08-23 LAB — HEPATIC FUNCTION PANEL
ALT: 29 IU/L (ref 0–44)
AST: 54 IU/L — ABNORMAL HIGH (ref 0–40)
Albumin: 4.5 g/dL (ref 3.7–4.7)
Alkaline Phosphatase: 72 IU/L (ref 44–121)
Bilirubin Total: 0.8 mg/dL (ref 0.0–1.2)
Bilirubin, Direct: 0.22 mg/dL (ref 0.00–0.40)
Total Protein: 7.1 g/dL (ref 6.0–8.5)

## 2021-08-23 LAB — LIPID PANEL WITH LDL/HDL RATIO
Cholesterol, Total: 181 mg/dL (ref 100–199)
HDL: 62 mg/dL (ref >39)
LDL Chol Calc (NIH): 101 mg/dL — ABNORMAL HIGH (ref 0–99)
LDL/HDL Ratio: 1.6 ratio (ref 0.0–3.6)
Triglycerides: 100 mg/dL (ref 0–149)
VLDL Cholesterol Cal: 18 mg/dL (ref 5–40)

## 2021-09-23 ENCOUNTER — Other Ambulatory Visit: Payer: Self-pay | Admitting: Family Medicine

## 2021-09-23 DIAGNOSIS — E7849 Other hyperlipidemia: Secondary | ICD-10-CM

## 2021-09-23 DIAGNOSIS — J4550 Severe persistent asthma, uncomplicated: Secondary | ICD-10-CM

## 2021-09-24 ENCOUNTER — Other Ambulatory Visit: Payer: Self-pay

## 2021-09-24 DIAGNOSIS — E7849 Other hyperlipidemia: Secondary | ICD-10-CM

## 2021-09-24 MED ORDER — ROSUVASTATIN CALCIUM 5 MG PO TABS: ORAL_TABLET | ORAL | 0 refills | Status: DC

## 2021-09-24 NOTE — Telephone Encounter (Signed)
Crestor sent to office. Requested Prescriptions  Pending Prescriptions Disp Refills  . VENTOLIN HFA 108 (90 Base) MCG/ACT inhaler [Pharmacy Med Name: VENTOLIN HFA 108 (90 BASE) MCG/ACT] 18 g 1    Sig: USE 2 PUFFS EVERY 6 HOURS AS NEEDED FOR SHORTNESS OF BREATH OR WHEEZING     Pulmonology:  Beta Agonists 2 Passed - 09/23/2021  3:59 PM      Passed - Last BP in normal range    BP Readings from Last 1 Encounters:  08/22/21 138/72         Passed - Last Heart Rate in normal range    Pulse Readings from Last 1 Encounters:  08/22/21 78         Passed - Valid encounter within last 12 months    Recent Outpatient Visits          1 month ago Familial multiple lipoprotein-type hyperlipidemia   [facility] Juline Patch, MD   2 months ago Chronic obstructive pulmonary disease with acute exacerbation ([place])   [facility] Juline Patch, MD   3 months ago Essential (primary) hypertension   [facility] Juline Patch, MD   9 months ago Elevated AST (SGOT)   [facility] Juline Patch, MD   1 year ago Essential (primary) hypertension   [facility] Juline Patch, MD      Future Appointments            In 3 months Juline Patch, MD [facility], [place]   In 9 months McGowan, Gordan Payment [place]           . rosuvastatin (CRESTOR) 5 MG tablet [Pharmacy Med Name: ROSUVASTATIN CALCIUM 5 MG TAB] 90 tablet 0    Sig: TAKE (1) TABLET BY MOUTH EVERY DAY     Cardiovascular:  Antilipid - Statins 2 Failed - 09/23/2021  3:59 PM      Failed - Cr in normal range and within 360 days    Creatinine, Ser  Date Value Ref Range Status  08/15/2020 1.05 0.76 - 1.27 mg/dL Final         Failed - Lipid Panel in normal range within the last 12 months    Cholesterol, Total  Date Value Ref Range Status  08/22/2021 181 100 - 199 mg/dL Final   LDL Chol Calc (NIH)  Date Value Ref Range Status  08/22/2021 101 (H) 0 -  99 mg/dL Final   HDL  Date Value Ref Range Status  08/22/2021 62 >39 mg/dL Final   Triglycerides  Date Value Ref Range Status  08/22/2021 100 0 - 149 mg/dL Final         Passed - Patient is not pregnant      Passed - Valid encounter within last 12 months    Recent Outpatient Visits          1 month ago Familial multiple lipoprotein-type hyperlipidemia   [facility] Juline Patch, MD   2 months ago Chronic obstructive pulmonary disease with acute exacerbation ([place])   [facility] Juline Patch, MD   3 months ago Essential (primary) hypertension   [facility] Juline Patch, MD   9 months ago Elevated AST (SGOT)   [facility] Juline Patch, MD   1 year ago Essential (primary) hypertension   [facility] Juline Patch, MD      Future Appointments  In 3 months Juline Patch, MD [facility], [place]   In 9 months McGowan, Gordan Payment [facility] [place]           . ezetimibe (ZETIA) 10 MG tablet [Pharmacy Med Name: EZETIMIBE 10 MG TAB] 90 tablet 0    Sig: TAKE (1) TABLET BY MOUTH EVERY DAY     Cardiovascular:  Antilipid - Sterol Transport Inhibitors Failed - 09/23/2021  3:59 PM      Failed - AST in normal range and within 360 days    AST  Date Value Ref Range Status  08/22/2021 54 (H) 0 - 40 IU/L Final         Failed - Lipid Panel in normal range within the last 12 months    Cholesterol, Total  Date Value Ref Range Status  08/22/2021 181 100 - 199 mg/dL Final   LDL Chol Calc (NIH)  Date Value Ref Range Status  08/22/2021 101 (H) 0 - 99 mg/dL Final   HDL  Date Value Ref Range Status  08/22/2021 62 >39 mg/dL Final   Triglycerides  Date Value Ref Range Status  08/22/2021 100 0 - 149 mg/dL Final         Passed - ALT in normal range and within 360 days    ALT  Date Value Ref Range Status  08/22/2021 29 0 - 44 IU/L Final         Passed - Patient is not pregnant       Passed - Valid encounter within last 12 months    Recent Outpatient Visits          1 month ago Familial multiple lipoprotein-type hyperlipidemia   [facility] Juline Patch, MD   2 months ago Chronic obstructive pulmonary disease with acute exacerbation ([place])   [facility] Juline Patch, MD   3 months ago Essential (primary) hypertension   [facility] Juline Patch, MD   9 months ago Elevated AST (SGOT)   [facility] Juline Patch, MD   1 year ago Essential (primary) hypertension   [facility] Juline Patch, MD      Future Appointments            In 3 months Juline Patch, MD [facility], [place]   In 9 months McGowan, Gordan Payment [facility] [place]

## 2021-09-24 NOTE — Telephone Encounter (Signed)
Requested medication (s) are due for refill today: yes  Requested medication (s) are on the active medication list: yes  Last refill:  06/26/21 #90 with 0 RF  Future visit scheduled: 12/26/21, seen 08/22/21  Notes to clinic:  Failed protocol of labs within 12 months, (Creat), has upcoming appt, please assess.       Requested Prescriptions  Pending Prescriptions Disp Refills   rosuvastatin (CRESTOR) 5 MG tablet [Pharmacy Med Name: ROSUVASTATIN CALCIUM 5 MG TAB] 90 tablet 0    Sig: TAKE (1) TABLET BY MOUTH EVERY DAY     Cardiovascular:  Antilipid - Statins 2 Failed - 09/23/2021  3:59 PM      Failed - Cr in normal range and within 360 days    Creatinine, Ser  Date Value Ref Range Status  08/15/2020 1.05 0.76 - 1.27 mg/dL Final         Failed - Lipid Panel in normal range within the last 12 months    Cholesterol, Total  Date Value Ref Range Status  08/22/2021 181 100 - 199 mg/dL Final   LDL Chol Calc (NIH)  Date Value Ref Range Status  08/22/2021 101 (H) 0 - 99 mg/dL Final   HDL  Date Value Ref Range Status  08/22/2021 62 >39 mg/dL Final   Triglycerides  Date Value Ref Range Status  08/22/2021 100 0 - 149 mg/dL Final         Passed - Patient is not pregnant      Passed - Valid encounter within last 12 months    Recent Outpatient Visits           1 month ago Familial multiple lipoprotein-type hyperlipidemia   [facility] Juline Patch, MD   2 months ago Chronic obstructive pulmonary disease with acute exacerbation ([place])   [facility] Juline Patch, MD   3 months ago Essential (primary) hypertension   [facility] Juline Patch, MD   9 months ago Elevated AST (SGOT)   [facility] Juline Patch, MD   1 year ago Essential (primary) hypertension   [facility] Juline Patch, MD       Future Appointments             In 3 months Juline Patch, MD [facility]   In 9 months McGowan, Gordan Payment [facility] [place]            Signed Prescriptions Disp Refills   VENTOLIN HFA 108 (90 Base) MCG/ACT inhaler 18 g 1    Sig: USE 2 PUFFS EVERY 6 HOURS AS NEEDED FOR SHORTNESS OF BREATH OR WHEEZING     Pulmonology:  Beta Agonists 2 Passed - 09/23/2021  3:59 PM      Passed - Last BP in normal range    BP Readings from Last 1 Encounters:  08/22/21 138/72         Passed - Last Heart Rate in normal range    Pulse Readings from Last 1 Encounters:  08/22/21 78         Passed - Valid encounter within last 12 months    Recent Outpatient Visits           1 month ago Familial multiple lipoprotein-type hyperlipidemia   [facility] Juline Patch, MD   2 months ago Chronic obstructive pulmonary disease with acute exacerbation [facility])   [facility] Juline Patch, MD   3  months ago Essential (primary) hypertension   [facility] Juline Patch, MD   9 months ago Elevated AST (SGOT)   [facility] Juline Patch, MD   1 year ago Essential (primary) hypertension   [facility] Juline Patch, MD       Future Appointments             In 3 months Juline Patch, MD [facility]   In 9 months McGowan, Hunt Oris, PA-C [facility] [place]             ezetimibe (ZETIA) 10 MG tablet 90 tablet 0    Sig: TAKE (1) TABLET BY MOUTH EVERY DAY     Cardiovascular:  Antilipid - Sterol Transport Inhibitors Failed - 09/23/2021  3:59 PM      Failed - AST in normal range and within 360 days    AST  Date Value Ref Range Status  08/22/2021 54 (H) 0 - 40 IU/L Final         Failed - Lipid Panel in normal range within the last 12 months    Cholesterol, Total  Date Value Ref Range Status  08/22/2021 181 100 - 199 mg/dL Final   LDL Chol Calc (NIH)  Date Value Ref Range Status  08/22/2021 101 (H) 0 - 99 mg/dL Final   HDL  Date Value Ref Range Status  08/22/2021 62 >39 mg/dL Final    Triglycerides  Date Value Ref Range Status  08/22/2021 100 0 - 149 mg/dL Final         Passed - ALT in normal range and within 360 days    ALT  Date Value Ref Range Status  08/22/2021 29 0 - 44 IU/L Final         Passed - Patient is not pregnant      Passed - Valid encounter within last 12 months    Recent Outpatient Visits           1 month ago Familial multiple lipoprotein-type hyperlipidemia   [facility] Juline Patch, MD   2 months ago Chronic obstructive pulmonary disease with acute exacerbation ([place])   [facility] Juline Patch, MD   3 months ago Essential (primary) hypertension   [facility] Juline Patch, MD   9 months ago Elevated AST (SGOT)   [facility] Juline Patch, MD   1 year ago Essential (primary) hypertension   [facility] Juline Patch, MD       Future Appointments             In 3 months Juline Patch, MD [facility] [place]   In 9 months McGowan, Gordan Payment [facility] [place]

## 2021-10-07 ENCOUNTER — Ambulatory Visit: Payer: Medicare Other | Admitting: Nurse Practitioner

## 2021-10-17 ENCOUNTER — Encounter: Payer: Self-pay | Admitting: Nurse Practitioner

## 2021-10-17 ENCOUNTER — Ambulatory Visit: Payer: Medicare Other | Admitting: Nurse Practitioner

## 2021-10-17 VITALS — BP 132/60 | HR 80 | Temp 98.3°F | Resp 16 | Ht 70.0 in | Wt 185.0 lb

## 2021-10-17 DIAGNOSIS — J449 Chronic obstructive pulmonary disease, unspecified: Secondary | ICD-10-CM

## 2021-10-17 DIAGNOSIS — Z7189 Other specified counseling: Secondary | ICD-10-CM | POA: Diagnosis not present

## 2021-10-17 DIAGNOSIS — G4733 Obstructive sleep apnea (adult) (pediatric): Secondary | ICD-10-CM | POA: Diagnosis not present

## 2021-10-17 DIAGNOSIS — Z9989 Dependence on other enabling machines and devices: Secondary | ICD-10-CM | POA: Diagnosis not present

## 2021-10-17 MED ORDER — FLUTICASONE-UMECLIDIN-VILANT 100-62.5-25 MCG/ACT IN AEPB: 1.0000 | INHALATION_SPRAY | Freq: Every day | RESPIRATORY_TRACT | 3 refills | Status: DC

## 2021-10-17 NOTE — Progress Notes (Signed)
[facility] [place], [place] [address]  Internal MEDICINE  Office Visit Note  Patient Name: Marcus Delacruz  [id number]  [id number]  Date of Service: 10/17/2021  Chief Complaint  Patient presents with   Follow-up    Pft followup    HPI Jacquese presents for a follow up visit for COPD, OSA on CPAP and PFT review.  --PFT suggests mild obstructive lung disease. Respiratory status has improved when compared to most recent spirometry done in June this year --uses CPAP for OSA. Reports that he uses it every night for at least 4 hours per night.  99% compliance according to the patient's app for his CPAP that records all the data from the machine. He does not meet with Claiborne Billings RT so we do not have a CPAP download. He reports that he wakes up well-rested every morning.  --uses trelegy ellipta inhaler daily as prescribed which has been helping with his breathing very much. He reports that he has not needed to have a nebulizer treatment in over a year.     Current Medication: Outpatient Encounter Medications as of 10/17/2021  Medication Sig   ezetimibe (ZETIA) 10 MG tablet TAKE (1) TABLET BY MOUTH EVERY DAY   fexofenadine (ALLERGY RELIEF) 180 MG tablet TAKE (1) TABLET BY MOUTH EVERY DAY   finasteride (PROSCAR) 5 MG tablet TAKE (1) TABLET BY MOUTH EVERY DAY   finasteride (PROSCAR) 5 MG tablet Take 1 tablet (5 mg total) by mouth daily.   ibuprofen (ADVIL) 800 MG tablet Take 1 tablet three times a day as needed   ipratropium-albuterol (DUONEB) 0.5-2.5 (3) MG/3ML SOLN Take 3 mLs by nebulization every 6 (six) hours as needed.   lisinopril-hydrochlorothiazide (ZESTORETIC) 10-12.5 MG tablet TAKE (1) TABLET BY MOUTH TWICE DAILY   montelukast (SINGULAIR) 10 MG tablet Take 1 tablet daily   NON FORMULARY cpap device   rosuvastatin (CRESTOR) 5 MG tablet TAKE (1) TABLET BY MOUTH EVERY DAY   VENTOLIN HFA 108 (90 Base) MCG/ACT inhaler USE 2 PUFFS EVERY 6 HOURS AS NEEDED FOR  SHORTNESS OF BREATH OR WHEEZING   [DISCONTINUED] Fluticasone-Umeclidin-Vilant (TRELEGY ELLIPTA) 100-62.5-25 MCG/ACT AEPB Inhale 1 puff into the lungs daily.   Fluticasone-Umeclidin-Vilant (TRELEGY ELLIPTA) 100-62.5-25 MCG/ACT AEPB Inhale 1 puff into the lungs daily.   Facility-Administered Encounter Medications as of 10/17/2021  Medication   ipratropium-albuterol (DUONEB) 0.5-2.5 (3) MG/3ML nebulizer solution 3 mL   ipratropium-albuterol (DUONEB) 0.5-2.5 (3) MG/3ML nebulizer solution 3 mL    Surgical History: Past Surgical History:  Procedure Laterality Date   ARTERY REPAIR     BACK SURGERY     CHOLECYSTECTOMY     COLONOSCOPY  2013   cleared   HERNIA REPAIR      Medical History: Past Medical History:  Diagnosis Date   Allergic rhinitis due to pollen    Asthma    BPH (benign prostatic hyperplasia)    Cardiomegaly    Chronic obstructive pulmonary disease (COPD) (HCC)    Hypercholesteremia    Hypersomnia, unspecified    Hypertension    Obstructive sleep apnea (adult) (pediatric)    Shortness of breath    Snoring    Solitary pulmonary nodule     Family History: Family History  Problem Relation Age of Onset   COPD Mother    Cancer Father        lung   Asthma Son    Prostate cancer Neg Hx    Kidney disease Neg Hx     Social History  Socioeconomic History   Marital status: Married    Spouse name: Not on file   Number of children: 3   Years of education: Not on file   Highest education level: Associate degree: academic program  Occupational History   Occupation: retired  Tobacco Use   Smoking status: Former    Packs/day: 1.00    Years: 20.00    Total pack years: 20.00    Types: Cigarettes    Quit date: 08/09/1983    Years since quitting: 38.3    Passive exposure: Past   Smokeless tobacco: Never   Tobacco comments:    quit smoking over 40 years ago  Vaping Use   Vaping Use: Never used  Substance and Sexual Activity   Alcohol use: Yes    Alcohol/week:  0.0 standard drinks of alcohol    Comment: once monthly   Drug use: No   Sexual activity: Not Currently  Other Topics Concern   Not on file  Social History Narrative   Not on file   Social Determinants of Health   Financial Resource Strain: Low Risk  (02/20/2021)   Overall Financial Resource Strain (CARDIA)    Difficulty of Paying Living Expenses: Not hard at all  Food Insecurity: No Food Insecurity (02/20/2021)   Hunger Vital Sign    Worried About Running Out of Food in the Last Year: Never true    Ran Out of Food in the Last Year: Never true  Transportation Needs: No Transportation Needs (02/20/2021)   PRAPARE - Hydrologist (Medical): No    Lack of Transportation (Non-Medical): No  Physical Activity: Sufficiently Active (02/20/2021)   Exercise Vital Sign    Days of Exercise per Week: 7 days    Minutes of Exercise per Session: 30 min  Stress: No Stress Concern Present (02/20/2021)   [place]    Feeling of Stress : Not at all  Social Connections: Moderately Integrated (02/20/2021)   Social Connection and Isolation Panel [NHANES]    Frequency of Communication with Friends and Family: More than three times a week    Frequency of Social Gatherings with Friends and Family: More than three times a week    Attends Religious Services: More than 4 times per year    Active Member of [employer] or Organizations: No    Attends Archivist Meetings: Never    Marital Status: Married  Human resources officer Violence: Not At Risk (02/20/2021)   Humiliation, Afraid, Rape, and Kick questionnaire    Fear of Current or Ex-Partner: No    Emotionally Abused: No    Physically Abused: No    Sexually Abused: No      Review of Systems  Constitutional:  Negative for chills, fatigue and unexpected weight change.  HENT:  Negative for congestion, postnasal drip, rhinorrhea, sneezing and sore throat.    Eyes:  Negative for redness.  Respiratory: Negative.  Negative for cough, chest tightness, shortness of breath and wheezing.   Cardiovascular: Negative.  Negative for chest pain and palpitations.  Gastrointestinal:  Negative for abdominal pain, constipation, diarrhea, nausea and vomiting.  Genitourinary:  Negative for dysuria and frequency.  Musculoskeletal:  Negative for arthralgias, back pain, joint swelling and neck pain.  Skin:  Negative for rash.  Neurological: Negative.  Negative for tremors and numbness.  Hematological:  Negative for adenopathy. Does not bruise/bleed easily.  Psychiatric/Behavioral:  Negative for behavioral problems (Depression), sleep disturbance and suicidal ideas.  The patient is not nervous/anxious.     Vital Signs: BP 132/60   Pulse 80   Temp 98.3 F (36.8 C)   Resp 16   Ht '5\' 10"'$  (1.778 m)   Wt 185 lb (83.9 kg)   SpO2 94%   BMI 26.54 kg/m    Physical Exam Vitals reviewed.  Constitutional:      General: He is not in acute distress.    Appearance: Normal appearance. He is normal weight. He is not ill-appearing.  HENT:     Head: Normocephalic and atraumatic.  Eyes:     Pupils: Pupils are equal, round, and reactive to light.  Cardiovascular:     Rate and Rhythm: Normal rate and regular rhythm.     Pulses: Normal pulses.     Heart sounds: Normal heart sounds. No murmur heard. Pulmonary:     Effort: Pulmonary effort is normal. No respiratory distress.     Breath sounds: Normal breath sounds. No wheezing or rhonchi.  Neurological:     Mental Status: He is alert and oriented to person, place, and time.     Cranial Nerves: No cranial nerve deficit.     Coordination: Coordination normal.     Gait: Gait normal.  Psychiatric:        Mood and Affect: Mood normal.        Behavior: Behavior normal.        Assessment/Plan: 1. Chronic obstructive pulmonary disease, unspecified COPD type ([place]) Doing well, slightly improved on recent PFT. Continue  trelegy ellipta as prescribed, refills ordered - Fluticasone-Umeclidin-Vilant (TRELEGY ELLIPTA) 100-62.5-25 MCG/ACT AEPB; Inhale 1 puff into the lungs daily.  Dispense: 180 each; Refill: 3  2. OSA on CPAP Stable, continue as instructed. No issues, does not need any supplies.   3. CPAP use counseling No questions or concerns related to use or maintenance of his CPAP machine.   General Counseling: Juanya verbalizes understanding of the findings of todays visit and agrees with plan of treatment. I have discussed any further diagnostic evaluation that may be needed or ordered today. We also reviewed his medications today. he has been encouraged to call the office with any questions or concerns that should arise related to todays visit.    No orders of the defined types were placed in this encounter.   Meds ordered this encounter  Medications   Fluticasone-Umeclidin-Vilant (TRELEGY ELLIPTA) 100-62.5-25 MCG/ACT AEPB    Sig: Inhale 1 puff into the lungs daily.    Dispense:  180 each    Refill:  3    Please fill as a 90 day supply    Return in about 6 months (around 04/19/2022) for F/U, pulmonary/sleep, Keeva Reisen or DSK.   Total time spent:30 Minutes Time spent includes review of chart, medications, test results, and follow up plan with the patient.   [place] Controlled Substance Database was reviewed by me.  This patient was seen by Jonetta Osgood, FNP-C in collaboration with Dr. Clayborn Bigness as a part of collaborative care agreement.   Erik Nessel R. Valetta Fuller, MSN, FNP-C Internal medicine

## 2021-10-18 DIAGNOSIS — G4733 Obstructive sleep apnea (adult) (pediatric): Secondary | ICD-10-CM | POA: Diagnosis not present

## 2021-12-08 ENCOUNTER — Encounter: Payer: Self-pay | Admitting: Nurse Practitioner

## 2021-12-17 DIAGNOSIS — H6123 Impacted cerumen, bilateral: Secondary | ICD-10-CM | POA: Diagnosis not present

## 2021-12-17 DIAGNOSIS — H6063 Unspecified chronic otitis externa, bilateral: Secondary | ICD-10-CM | POA: Diagnosis not present

## 2021-12-21 ENCOUNTER — Other Ambulatory Visit: Payer: Self-pay | Admitting: Family Medicine

## 2021-12-21 ENCOUNTER — Other Ambulatory Visit: Payer: Self-pay | Admitting: Internal Medicine

## 2021-12-21 DIAGNOSIS — E7849 Other hyperlipidemia: Secondary | ICD-10-CM

## 2021-12-21 DIAGNOSIS — G8929 Other chronic pain: Secondary | ICD-10-CM

## 2021-12-23 ENCOUNTER — Other Ambulatory Visit: Payer: Self-pay | Admitting: Family Medicine

## 2021-12-23 DIAGNOSIS — G8929 Other chronic pain: Secondary | ICD-10-CM

## 2021-12-23 DIAGNOSIS — E7849 Other hyperlipidemia: Secondary | ICD-10-CM

## 2021-12-26 ENCOUNTER — Ambulatory Visit
Admission: RE | Admit: 2021-12-26 | Discharge: 2021-12-26 | Disposition: A | Discharge status: Home / Self Care | Payer: Medicare Other | Attending: Family Medicine | Admitting: Family Medicine

## 2021-12-26 ENCOUNTER — Ambulatory Visit
Admission: RE | Admit: 2021-12-26 | Discharge: 2021-12-26 | Disposition: A | Discharge status: Home / Self Care | Payer: Medicare Other | Source: Ambulatory Visit | Attending: Family Medicine | Admitting: Family Medicine

## 2021-12-26 ENCOUNTER — Encounter: Payer: Self-pay | Admitting: Family Medicine

## 2021-12-26 ENCOUNTER — Ambulatory Visit: Payer: Medicare Other | Admitting: Family Medicine

## 2021-12-26 VITALS — BP 128/78 | HR 64 | Ht 70.0 in | Wt 182.0 lb

## 2021-12-26 DIAGNOSIS — R7401 Elevation of levels of liver transaminase levels: Secondary | ICD-10-CM

## 2021-12-26 DIAGNOSIS — J4550 Severe persistent asthma, uncomplicated: Secondary | ICD-10-CM

## 2021-12-26 DIAGNOSIS — M19041 Primary osteoarthritis, right hand: Secondary | ICD-10-CM | POA: Diagnosis not present

## 2021-12-26 DIAGNOSIS — M79644 Pain in right finger(s): Secondary | ICD-10-CM

## 2021-12-26 DIAGNOSIS — J301 Allergic rhinitis due to pollen: Secondary | ICD-10-CM | POA: Diagnosis not present

## 2021-12-26 DIAGNOSIS — I10 Essential (primary) hypertension: Secondary | ICD-10-CM | POA: Diagnosis not present

## 2021-12-26 DIAGNOSIS — E7849 Other hyperlipidemia: Secondary | ICD-10-CM

## 2021-12-26 MED ORDER — AMLODIPINE BESYLATE 2.5 MG PO TABS: 2.5000 mg | ORAL_TABLET | Freq: Every day | ORAL | 0 refills | Status: DC

## 2021-12-26 MED ORDER — EZETIMIBE 10 MG PO TABS: ORAL_TABLET | ORAL | 1 refills | Status: DC

## 2021-12-26 MED ORDER — FEXOFENADINE HCL 180 MG PO TABS: ORAL_TABLET | ORAL | 1 refills | Status: DC

## 2021-12-26 MED ORDER — MONTELUKAST SODIUM 10 MG PO TABS: ORAL_TABLET | ORAL | 1 refills | Status: DC

## 2021-12-26 MED ORDER — LOSARTAN POTASSIUM-HCTZ 50-12.5 MG PO TABS: 1.0000 | ORAL_TABLET | Freq: Every day | ORAL | 1 refills | Status: DC

## 2021-12-26 MED ORDER — ROSUVASTATIN CALCIUM 5 MG PO TABS: ORAL_TABLET | ORAL | 1 refills | Status: DC

## 2021-12-26 NOTE — Progress Notes (Signed)
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 The current episode started more than 1 year ago. The problem has been gradually improving since onset. The problem is controlled. Pertinent negatives include no peripheral edema or shortness of breath. There are no associated agents to hypertension. There are no known risk factors for coronary artery disease. Past treatments include ACE inhibitors and diuretics. The current treatment provides moderate improvement. There are no compliance problems.  There is no history of angina, kidney disease, CAD/MI, heart failure or left ventricular hypertrophy. There is no history of chronic renal disease, a hypertension causing med or renovascular disease.  Hyperlipidemia This is a chronic problem. The current episode started more than 1 year ago. The problem is controlled. Recent lipid tests were reviewed and are normal. He has no history of chronic renal disease. There are no known factors aggravating his hyperlipidemia. Pertinent negatives include no focal sensory loss, leg pain or shortness of breath. Current antihyperlipidemic treatment includes statins.  Hand Pain  The incident occurred more than 1 week ago. There was no injury mechanism. The pain is present in the right fingers (distal 3rd/right). The quality of the pain is described as aching.    Lab Results  Component Value Date   NA 133 (L) 08/15/2020   K 4.4 08/15/2020   CO2 24 08/15/2020   GLUCOSE 93 08/15/2020   BUN 15 08/15/2020   CREATININE 1.05 08/15/2020   CALCIUM 9.0 08/15/2020   EGFR 73 08/15/2020   GFRNONAA 49 (L) 02/20/2020   Lab Results  Component Value Date   CHOL 181 08/22/2021   HDL 62 08/22/2021   LDLCALC 101 (H) 08/22/2021   TRIG 100 08/22/2021    CHOLHDL 3.5 08/20/2017   No results found for: "TSH" No results found for: "HGBA1C" Lab Results  Component Value Date   WBC 10.8 12/20/2020   HGB 15.6 12/20/2020   HCT 46.5 12/20/2020   MCV 88 12/20/2020   PLT 312 12/20/2020   Lab Results  Component Value Date   ALT 29 08/22/2021   AST 54 (H) 08/22/2021   ALKPHOS 72 08/22/2021   BILITOT 0.8 08/22/2021   No results found for: "25OHVITD2", "25OHVITD3", "VD25OH"   Review of Systems  Respiratory:  Negative for shortness of breath.     Patient Active Problem List   Diagnosis Date Noted   Chronic obstructive pulmonary disease with acute exacerbation ([place]) 07/27/2017   Asthma, chronic, severe persistent, uncomplicated 62/94/7654   Seasonal allergic rhinitis due to pollen 08/18/2016   Bilateral carotid artery stenosis 04/03/2016   Bilateral hydrocele 11/22/2014   BPH with obstruction/lower urinary tract symptoms 10/05/2014   Epididymal cyst 10/05/2014   Familial multiple lipoprotein-type hyperlipidemia 07/14/2014   Laboratory animal allergy 07/14/2014   Acute arthropathy 07/14/2014   Routine general medical examination at a health care facility 07/14/2014   Asthma with exacerbation 07/14/2014   Essential (primary) hypertension 07/14/2014   Screening for depression 07/14/2014   Noncompliance w/medication treatment due to intermit use of medication 11/23/2013   Asthma, chronic 08/08/2013    Allergies  Allergen Reactions   Codeine Itching    Past Surgical History:  Procedure Laterality Date   ARTERY REPAIR     BACK SURGERY     CHOLECYSTECTOMY  COLONOSCOPY  2013   cleared   HERNIA REPAIR      Social History   Tobacco Use   Smoking status: Former    Packs/day: 1.00    Years: 20.00    Total pack years: 20.00    Types: Cigarettes    Quit date: 08/09/1983    Years since quitting: 38.4    Passive exposure: Past   Smokeless tobacco: Never   Tobacco comments:    quit smoking over 40 years ago  Vaping Use    Vaping Use: Never used  Substance Use Topics   Alcohol use: Yes    Alcohol/week: 0.0 standard drinks of alcohol    Comment: once monthly   Drug use: No     Medication list has been reviewed and updated.  Current Meds  Medication Sig   ezetimibe (ZETIA) 10 MG tablet TAKE (1) TABLET BY MOUTH EVERY DAY   fexofenadine (ALLERGY RELIEF) 180 MG tablet TAKE (1) TABLET BY MOUTH EVERY DAY   finasteride (PROSCAR) 5 MG tablet Take 1 tablet (5 mg total) by mouth daily.   Fluticasone-Umeclidin-Vilant (TRELEGY ELLIPTA) 100-62.5-25 MCG/ACT AEPB Inhale 1 puff into the lungs daily.   ibuprofen (ADVIL) 800 MG tablet TAKE (1) TABLET BY MOUTH THREE TIMES A DAY AS NEEDED   ipratropium-albuterol (DUONEB) 0.5-2.5 (3) MG/3ML SOLN Take 3 mLs by nebulization every 6 (six) hours as needed.   lisinopril-hydrochlorothiazide (ZESTORETIC) 10-12.5 MG tablet TAKE (1) TABLET BY MOUTH TWICE DAILY   montelukast (SINGULAIR) 10 MG tablet Take 1 tablet daily   NON FORMULARY cpap device   rosuvastatin (CRESTOR) 5 MG tablet TAKE (1) TABLET BY MOUTH EVERY DAY   VENTOLIN HFA 108 (90 Base) MCG/ACT inhaler USE 2 PUFFS EVERY 6 HOURS AS NEEDED FOR SHORTNESS OF BREATH OR WHEEZING   Current Facility-Administered Medications for the 12/26/21 encounter (Office Visit) with Juline Patch, MD  Medication   ipratropium-albuterol (DUONEB) 0.5-2.5 (3) MG/3ML nebulizer solution 3 mL   ipratropium-albuterol (DUONEB) 0.5-2.5 (3) MG/3ML nebulizer solution 3 mL       12/26/2021   10:45 AM 07/02/2021   11:03 AM 06/26/2021   11:12 AM 07/12/2020    1:49 PM  GAD 7 : Generalized Anxiety Score  Nervous, Anxious, on Edge 0 0 0 1  Control/stop worrying 0 0 0 0  Worry too much - different things 0 0 0 0  Trouble relaxing 0 0 0 0  Restless 0 0 0 0  Easily annoyed or irritable 0 0 0 0  Afraid - awful might happen 0 0 0 0  Total GAD 7 Score 0 0 0 1  Anxiety Difficulty Not difficult at all Not difficult at all Not difficult at all         12/26/2021   10:44 AM 07/02/2021   11:02 AM 06/26/2021   11:12 AM  Depression screen PHQ 2/9  Decreased Interest 0 0 0  Down, Depressed, Hopeless 0 0 0  PHQ - 2 Score 0 0 0  Altered sleeping 0 0 0  Tired, decreased energy 0 1 0  Change in appetite 0 0 0  Feeling bad or failure about yourself  0 0 0  Trouble concentrating 0 0 0  Moving slowly or fidgety/restless 0 0 0  Suicidal thoughts 0 0 0  PHQ-9 Score 0 1 0  Difficult doing work/chores Not difficult at all Not difficult at all Not difficult at all    BP Readings from Last 3 Encounters:  12/26/21 128/78  10/17/21 132/60  08/22/21 138/72    Physical Exam Vitals and nursing note reviewed.  HENT:     Head: Normocephalic.     Right Ear: External ear normal.     Left Ear: External ear normal.     Nose: Nose normal.  Eyes:     General: No scleral icterus.       Right eye: No discharge.        Left eye: No discharge.     Conjunctiva/sclera: Conjunctivae normal.     Pupils: Pupils are equal, round, and reactive to light.  Neck:     Thyroid: No thyromegaly.     Vascular: No JVD.     Trachea: No tracheal deviation.  Cardiovascular:     Rate and Rhythm: Regular rhythm.     Heart sounds: Normal heart sounds. No murmur heard.    No friction rub. No gallop.  Pulmonary:     Effort: No respiratory distress.     Breath sounds: Normal breath sounds. No wheezing, rhonchi or rales.  Abdominal:     General: Bowel sounds are normal.     Palpations: Abdomen is soft. There is no mass.     Tenderness: There is no abdominal tenderness. There is no guarding or rebound.  Musculoskeletal:        General: No tenderness. Normal range of motion.     Right hand: Deformity present.     Cervical back: Normal range of motion and neck supple.     Comments: Unable to extend distal phalanx right middle  Lymphadenopathy:     Cervical: No cervical adenopathy.  Skin:    General: Skin is warm.     Findings: No rash.  Neurological:     Mental  Status: He is alert and oriented to person, place, and time.     Cranial Nerves: No cranial nerve deficit.     Deep Tendon Reflexes: Reflexes are normal and symmetric.     Wt Readings from Last 3 Encounters:  12/26/21 182 lb (82.6 kg)  10/17/21 185 lb (83.9 kg)  08/22/21 178 lb (80.7 kg)    BP 128/78 (BP Location: Right Arm, Cuff Size: Large)   Pulse 64   Ht $R'5\' 10"'NT$  (1.778 m)   Wt 182 lb (82.6 kg)   SpO2 96%   BMI 26.11 kg/m   Assessment and Plan: 1. Essential (primary) hypertension Chronic.  Controlled.  Stable.  Blood pressure 128/78.  However we are going to try to go 420/80 and below if possible so we will discontinue the lisinopril hydrochlorothiazide twice a day and replace it with losartan 50 mg 12.5 mg once a day.  And will recheck in 4 to 6 weeks.  We will check renal function panel. - Renal Function Panel - amLODipine (NORVASC) 2.5 MG tablet; Take 1 tablet (2.5 mg total) by mouth daily.  Dispense: 60 tablet; Refill: 0 - losartan-hydrochlorothiazide (HYZAAR) 50-12.5 MG tablet; Take 1 tablet by mouth daily.  Dispense: 90 tablet; Refill: 1  2. Familial multiple lipoprotein-type hyperlipidemia Chronic.  Controlled.  Stable.  Continue Zetia 10 mg and rosuvastatin 5 mg. - ezetimibe (ZETIA) 10 MG tablet; TAKE (1) TABLET BY MOUTH EVERY DAY  Dispense: 90 tablet; Refill: 1 - rosuvastatin (CRESTOR) 5 MG tablet; TAKE (1) TABLET BY MOUTH EVERY DAY  Dispense: 90 tablet; Refill: 1  3. Seasonal allergic rhinitis due to pollen Chronic.  Controlled.  Stable.  Continue fexofenadine and Singulair at current dosing. - fexofenadine (ALLERGY RELIEF) 180 MG tablet; TAKE (1) TABLET BY  MOUTH EVERY DAY  Dispense: 90 tablet; Refill: 1 - montelukast (SINGULAIR) 10 MG tablet; Take 1 tablet daily  Dispense: 90 tablet; Refill: 1  4. Asthma, chronic, severe persistent, uncomplicated Chronic.  Controlled.  Stable.  Currently is on Trelegy and is doing well. - montelukast (SINGULAIR) 10 MG tablet; Take  1 tablet daily  Dispense: 90 tablet; Refill: 1  5. Elevated AST (SGOT) Last CMP noted a slight elevation of AST and patient is on statin we will check AST at current level. - AST  6. Finger pain, right New onset.  Patient has had a month or 2 of discomfort of his right middle finger distal aspect which is in flexion and patient is unable to extend his digit.  I suspect that he had an unrecognized injury that resulted in avulsion of the distal tendon we will get an x-ray to look for fracture and likely refer to sports medicine. - DG Finger Middle Right     Otilio Miu, MD

## 2021-12-27 LAB — RENAL FUNCTION PANEL
Albumin: 4.8 g/dL (ref 3.8–4.8)
BUN/Creatinine Ratio: 15 (ref 10–24)
BUN: 14 mg/dL (ref 8–27)
CO2: 26 mmol/L (ref 20–29)
Calcium: 9.8 mg/dL (ref 8.6–10.2)
Chloride: 97 mmol/L (ref 96–106)
Creatinine, Ser: 0.94 mg/dL (ref 0.76–1.27)
Glucose: 91 mg/dL (ref 70–99)
Phosphorus: 3 mg/dL (ref 2.8–4.1)
Potassium: 4.6 mmol/L (ref 3.5–5.2)
Sodium: 138 mmol/L (ref 134–144)
eGFR: 82 mL/min/{1.73_m2} (ref >59)

## 2021-12-27 LAB — AST: AST: 50 IU/L — ABNORMAL HIGH (ref 0–40)

## 2022-01-06 ENCOUNTER — Encounter (INDEPENDENT_AMBULATORY_CARE_PROVIDER_SITE_OTHER): Payer: Self-pay

## 2022-01-14 NOTE — Telephone Encounter (Unsigned)
Copied from [place] [phone]. Topic: General - Other >> Jan 14, 2022  4:24 PM Leilani Able wrote: Reason for CRM: pt wants [facility] to fu @ [phone] re the x-ray done on his finger and the fu to that result. States they are back in town.

## 2022-01-16 DIAGNOSIS — G4733 Obstructive sleep apnea (adult) (pediatric): Secondary | ICD-10-CM | POA: Diagnosis not present

## 2022-01-17 ENCOUNTER — Ambulatory Visit (INDEPENDENT_AMBULATORY_CARE_PROVIDER_SITE_OTHER): Payer: Medicare Other | Admitting: Nurse Practitioner

## 2022-01-17 ENCOUNTER — Encounter (INDEPENDENT_AMBULATORY_CARE_PROVIDER_SITE_OTHER): Payer: Medicare Other

## 2022-01-21 ENCOUNTER — Encounter: Payer: Self-pay | Admitting: Family Medicine

## 2022-01-21 ENCOUNTER — Ambulatory Visit: Payer: Medicare Other | Admitting: Family Medicine

## 2022-01-21 VITALS — BP 144/70 | HR 68 | Ht 70.0 in | Wt 181.0 lb

## 2022-01-21 DIAGNOSIS — M151 Heberden's nodes (with arthropathy): Secondary | ICD-10-CM | POA: Diagnosis not present

## 2022-01-21 MED ORDER — ASPIRIN 81 MG PO TBEC: 81.0000 mg | DELAYED_RELEASE_TABLET | Freq: Every day | ORAL | 12 refills | Status: DC

## 2022-01-21 NOTE — Patient Instructions (Signed)
-   Use Voltaren gel (diclofenac 1%) 2-4 times / day x 4 weeks - Start and gently advance home exercises - Contact us for any persistent symptoms at the 4 week mark or later to discuss additional steps - Follow-up as-needed

## 2022-01-21 NOTE — Progress Notes (Signed)
Imaging    Primary Care / Sports Medicine Office Visit  Patient Information:  Patient ID: Marcus Delacruz, male DOB: 10-02-1940 Age: 81 y.o. MRN: [id number]   Marcus Delacruz is a pleasant 81 y.o. male presenting with the following:  Chief Complaint  Patient presents with   Hand Pain    (R) middle finger- arthritis is causing it to bend down. Hurts when try to straighten and when touched     Vitals:   01/21/22 1058  BP: (!) 144/70  Pulse: 68  SpO2: 99%   Vitals:   01/21/22 1058  Weight: 181 lb (82.1 kg)  Height: '5\' 10"'$  (1.778 m)   Body mass index is 25.97 kg/m.  DG Finger Middle Right  Result Date: 12/28/2021 CLINICAL DATA:  Right middle finger pain. EXAM: RIGHT MIDDLE FINGER 2+V COMPARISON:  None Available. FINDINGS: There is no evidence of fracture or dislocation. Severe degenerative changes seen involving the third distal interphalangeal joint. Linear density is seen adjacent to proximal interphalangeal joint concerning for possible foreign body. 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 No fracture or dislocation is noted. Possible small foreign body adjacent to proximal interphalangeal joint. Electronically Signed   By: Marijo Conception M.D.   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  He has noted associated decreased range of motion, swelling, and pain with inadvertent contact.  Denies any paresthesias, no treatments to date.  Examination reveals subtle flexion contracture at the third DIP, somewhat fixed, no sensory deficit, tender at the third DIP, range of motion otherwise intact.  Given his clinical picture, radiographic findings, his symptoms represent severe third digit PIP osteoarthritis.  We discussed both surgical and nonsurgical treatment strategies, plan for 4-week course of 2-4 applications daily of diclofenac 1%, home-based exercises with focus on gentle advance, and follow-up if symptoms persist without improvement.  Additional options include oral nonsteroidal versus steroidal regimens, intra-articular injections, occupational hand therapy, and surgery for arthroplasty evaluation      Relevant Medications   aspirin EC 81 MG tablet     Orders & Medications Meds ordered this encounter  Medications   aspirin EC 81 MG tablet    Sig: Take 1 tablet (81 mg total) by mouth daily. Swallow whole.    Dispense:  90 tablet    Refill:  12   No orders of the defined types were placed in this encounter.    Return if symptoms worsen or fail to improve.     Montel Culver, MD   Primary Care Sports Medicine [place]

## 2022-01-21 NOTE — Assessment & Plan Note (Signed)
Right-hand-dominant patient presenting with progressive chronic third digit pain localized to the DIP.  He has noted associated decreased range of motion, swelling, and pain with inadvertent contact.  Denies any paresthesias, no treatments to date.  Examination reveals subtle flexion contracture at the third DIP, somewhat fixed, no sensory deficit, tender at the third DIP, range of motion otherwise intact.  Given his clinical picture, radiographic findings, his symptoms represent severe third digit PIP osteoarthritis.  We discussed both surgical and nonsurgical treatment strategies, plan for 4-week course of 2-4 applications daily of diclofenac 1%, home-based exercises with focus on gentle advance, and follow-up if symptoms persist without improvement.  Additional options include oral nonsteroidal versus steroidal regimens, intra-articular injections, occupational hand therapy, and surgery for arthroplasty evaluation

## 2022-01-22 ENCOUNTER — Ambulatory Visit: Payer: Medicare Other

## 2022-01-22 DIAGNOSIS — G4733 Obstructive sleep apnea (adult) (pediatric): Secondary | ICD-10-CM

## 2022-01-22 NOTE — Progress Notes (Signed)
95 percentile pressure 11   95th percentile leak 2.3   apnea index 0.9 /hr  apnea-hypopnea index  2.2 /hr   total days used  >4 hr 55 days  total days used <4 hr 0 days  Total compliance 61 percent  He is doing great, the days not used on this cpap he uses his old one out of town. No problems or questions at this time.  Pt was seen by Claiborne Billings  RRT/RCP  from [facility]

## 2022-01-23 ENCOUNTER — Ambulatory Visit: Payer: Medicare Other | Admitting: Family Medicine

## 2022-01-23 ENCOUNTER — Encounter: Payer: Self-pay | Admitting: Family Medicine

## 2022-01-23 VITALS — BP 132/78 | HR 66 | Ht 70.0 in | Wt 181.0 lb

## 2022-01-23 DIAGNOSIS — R0609 Other forms of dyspnea: Secondary | ICD-10-CM | POA: Diagnosis not present

## 2022-01-23 DIAGNOSIS — L578 Other skin changes due to chronic exposure to nonionizing radiation: Secondary | ICD-10-CM | POA: Diagnosis not present

## 2022-01-23 DIAGNOSIS — I10 Essential (primary) hypertension: Secondary | ICD-10-CM

## 2022-01-23 DIAGNOSIS — J441 Chronic obstructive pulmonary disease with (acute) exacerbation: Secondary | ICD-10-CM | POA: Diagnosis not present

## 2022-01-23 DIAGNOSIS — Z872 Personal history of diseases of the skin and subcutaneous tissue: Secondary | ICD-10-CM | POA: Diagnosis not present

## 2022-01-23 DIAGNOSIS — L570 Actinic keratosis: Secondary | ICD-10-CM | POA: Diagnosis not present

## 2022-01-23 DIAGNOSIS — Z85828 Personal history of other malignant neoplasm of skin: Secondary | ICD-10-CM | POA: Diagnosis not present

## 2022-01-23 MED ORDER — PREDNISONE 10 MG PO TABS: 10.0000 mg | ORAL_TABLET | Freq: Every day | ORAL | 0 refills | Status: DC

## 2022-01-23 NOTE — Progress Notes (Signed)
Date:  01/23/2022   Name:  Marcus Delacruz   DOB:  11/05/1940   MRN:  [id number]   Chief Complaint: Hypertension (Bp recheck)  Hypertension This is a chronic problem. The current episode started more than 1 year ago. The problem has been gradually improving since onset. The problem is uncontrolled. Associated symptoms include shortness of breath. Pertinent negatives include no chest pain, headaches, neck pain or palpitations. (Resolution of cough) Past treatments include ACE inhibitors and diuretics. The current treatment provides moderate improvement. There are no compliance problems.  There is no history of angina, kidney disease, CAD/MI, CVA, heart failure, left ventricular hypertrophy, PVD or retinopathy.    Lab Results  Component Value Date   NA 138 12/26/2021   K 4.6 12/26/2021   CO2 26 12/26/2021   GLUCOSE 91 12/26/2021   BUN 14 12/26/2021   CREATININE 0.94 12/26/2021   CALCIUM 9.8 12/26/2021   EGFR 82 12/26/2021   GFRNONAA 49 (L) 02/20/2020   Lab Results  Component Value Date   CHOL 181 08/22/2021   HDL 62 08/22/2021   LDLCALC 101 (H) 08/22/2021   TRIG 100 08/22/2021   CHOLHDL 3.5 08/20/2017   No results found for: "TSH" No results found for: "HGBA1C" Lab Results  Component Value Date   WBC 10.8 12/20/2020   HGB 15.6 12/20/2020   HCT 46.5 12/20/2020   MCV 88 12/20/2020   PLT 312 12/20/2020   Lab Results  Component Value Date   ALT 29 08/22/2021   AST 50 (H) 12/26/2021   ALKPHOS 72 08/22/2021   BILITOT 0.8 08/22/2021   No results found for: "25OHVITD2", "25OHVITD3", "VD25OH"   Review of Systems  Constitutional:  Negative for chills and fever.  HENT:  Negative for drooling, ear discharge, ear pain and sore throat.   Respiratory:  Positive for shortness of breath. Negative for cough, chest tightness and wheezing.   Cardiovascular:  Negative for chest pain, palpitations and leg swelling.  Gastrointestinal:  Negative for abdominal pain, blood in  stool, constipation, diarrhea and nausea.  Endocrine: Negative for polydipsia.  Genitourinary:  Negative for dysuria, frequency, hematuria and urgency.  Musculoskeletal:  Negative for back pain, myalgias and neck pain.  Skin:  Negative for rash.  Allergic/Immunologic: Negative for environmental allergies.  Neurological:  Negative for dizziness and headaches.  Hematological:  Does not bruise/bleed easily.  Psychiatric/Behavioral:  Negative for suicidal ideas. The patient is not nervous/anxious.     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 01/21/2022   Chronic obstructive pulmonary disease with acute exacerbation ([place]) 07/27/2017   Asthma, chronic, severe persistent, uncomplicated 92/44/6286   Seasonal allergic rhinitis due to pollen 08/18/2016   Bilateral carotid artery stenosis 04/03/2016   Bilateral hydrocele 11/22/2014   BPH with obstruction/lower urinary tract symptoms 10/05/2014   Epididymal cyst 10/05/2014   Familial multiple lipoprotein-type hyperlipidemia 07/14/2014   Laboratory animal allergy 07/14/2014   Acute arthropathy 07/14/2014   Routine general medical examination at a health care facility 07/14/2014   Asthma with exacerbation 07/14/2014   Essential (primary) hypertension 07/14/2014   Screening for depression 07/14/2014   Noncompliance w/medication treatment due to intermit use of medication 11/23/2013   Asthma, chronic 08/08/2013    Allergies  Allergen Reactions   Codeine Itching    Past Surgical History:  Procedure Laterality Date   ARTERY REPAIR     BACK SURGERY     CHOLECYSTECTOMY  COLONOSCOPY  2013   cleared   HERNIA REPAIR      Social History   Tobacco Use   Smoking status: Former    Packs/day: 1.00    Years: 20.00    Total pack years: 20.00    Types: Cigarettes    Quit date: 08/09/1983    Years since quitting: 38.4    Passive exposure: Past    Smokeless tobacco: Never   Tobacco comments:    quit smoking over 40 years ago  Vaping Use   Vaping Use: Never used  Substance Use Topics   Alcohol use: Yes    Alcohol/week: 0.0 standard drinks of alcohol    Comment: once monthly   Drug use: No     Medication list has been reviewed and updated.  Current Meds  Medication Sig   amLODipine (NORVASC) 2.5 MG tablet Take 1 tablet (2.5 mg total) by mouth daily.   aspirin EC 81 MG tablet Take 1 tablet (81 mg total) by mouth daily. Swallow whole.   ezetimibe (ZETIA) 10 MG tablet TAKE (1) TABLET BY MOUTH EVERY DAY   fexofenadine (ALLERGY RELIEF) 180 MG tablet TAKE (1) TABLET BY MOUTH EVERY DAY   finasteride (PROSCAR) 5 MG tablet Take 1 tablet (5 mg total) by mouth daily.   Fluticasone-Umeclidin-Vilant (TRELEGY ELLIPTA) 100-62.5-25 MCG/ACT AEPB Inhale 1 puff into the lungs daily.   ibuprofen (ADVIL) 800 MG tablet TAKE (1) TABLET BY MOUTH THREE TIMES A DAY AS NEEDED   ipratropium-albuterol (DUONEB) 0.5-2.5 (3) MG/3ML SOLN Take 3 mLs by nebulization every 6 (six) hours as needed.   losartan-hydrochlorothiazide (HYZAAR) 50-12.5 MG tablet Take 1 tablet by mouth daily.   montelukast (SINGULAIR) 10 MG tablet Take 1 tablet daily   NON FORMULARY cpap device   rosuvastatin (CRESTOR) 5 MG tablet TAKE (1) TABLET BY MOUTH EVERY DAY   VENTOLIN HFA 108 (90 Base) MCG/ACT inhaler USE 2 PUFFS EVERY 6 HOURS AS NEEDED FOR SHORTNESS OF BREATH OR WHEEZING   Current Facility-Administered Medications for the 01/23/22 encounter (Office Visit) with Juline Patch, MD  Medication   ipratropium-albuterol (DUONEB) 0.5-2.5 (3) MG/3ML nebulizer solution 3 mL   ipratropium-albuterol (DUONEB) 0.5-2.5 (3) MG/3ML nebulizer solution 3 mL       12/26/2021   10:45 AM 07/02/2021   11:03 AM 06/26/2021   11:12 AM 07/12/2020    1:49 PM  GAD 7 : Generalized Anxiety Score  Nervous, Anxious, on Edge 0 0 0 1  Control/stop worrying 0 0 0 0  Worry too much - different things 0 0 0  0  Trouble relaxing 0 0 0 0  Restless 0 0 0 0  Easily annoyed or irritable 0 0 0 0  Afraid - awful might happen 0 0 0 0  Total GAD 7 Score 0 0 0 1  Anxiety Difficulty Not difficult at all Not difficult at all Not difficult at all        12/26/2021   10:44 AM 07/02/2021   11:02 AM 06/26/2021   11:12 AM  Depression screen PHQ 2/9  Decreased Interest 0 0 0  Down, Depressed, Hopeless 0 0 0  PHQ - 2 Score 0 0 0  Altered sleeping 0 0 0  Tired, decreased energy 0 1 0  Change in appetite 0 0 0  Feeling bad or failure about yourself  0 0 0  Trouble concentrating 0 0 0  Moving slowly or fidgety/restless 0 0 0  Suicidal thoughts 0 0 0  PHQ-9 Score 0  1 0  Difficult doing work/chores Not difficult at all Not difficult at all Not difficult at all    BP Readings from Last 3 Encounters:  01/23/22 132/78  01/21/22 (!) 144/70  12/26/21 128/78    Physical Exam Vitals and nursing note reviewed.  HENT:     Head: Normocephalic.     Right Ear: Tympanic membrane and external ear normal.     Left Ear: Tympanic membrane and external ear normal.     Nose: Nose normal. No congestion or rhinorrhea.  Eyes:     General: No scleral icterus.       Right eye: No discharge.        Left eye: No discharge.     Conjunctiva/sclera: Conjunctivae normal.     Pupils: Pupils are equal, round, and reactive to light.  Neck:     Thyroid: No thyromegaly.     Vascular: No JVD.     Trachea: Trachea normal. No tracheal deviation.  Cardiovascular:     Rate and Rhythm: Normal rate and regular rhythm.     Heart sounds: Normal heart sounds, S1 normal and S2 normal. No murmur heard.    No systolic murmur is present.     No diastolic murmur is present.     No friction rub. No gallop. No S3 or S4 sounds.  Pulmonary:     Effort: No respiratory distress.     Breath sounds: Normal breath sounds. No wheezing, rhonchi or rales.  Abdominal:     General: Bowel sounds are normal.     Palpations: Abdomen is soft. There  is no mass.     Tenderness: There is no abdominal tenderness. There is no guarding or rebound.  Musculoskeletal:        General: No tenderness. Normal range of motion.     Cervical back: Normal range of motion and neck supple.     Right lower leg: No edema.     Left lower leg: No edema.  Lymphadenopathy:     Cervical: No cervical adenopathy.  Skin:    General: Skin is warm.     Findings: No rash.  Neurological:     Mental Status: He is alert.     Wt Readings from Last 3 Encounters:  01/23/22 181 lb (82.1 kg)  01/21/22 181 lb (82.1 kg)  12/26/21 182 lb (82.6 kg)    BP 132/78 (BP Location: Right Arm, Cuff Size: Normal)   Pulse 66   Ht _0  (1.778 m)   Wt 181 lb (82.1 kg)   SpO2 95%   BMI 25.97 kg/m   Assessment and Plan:  1. Essential (primary) hypertension Chronic.  Uncontrolled.  Systolic remains above 132 and patient is still in stage I hypertension.  We will increase his losartan to 100 and hydrochlorothiazide 25 and will do so by doubling up on his current 50/12.5 dosing.  Patient will call if he is tolerating this well and then we will call in 100/25 dosing and will recheck patient's blood pressure in 6 weeks  2. DOE (dyspnea on exertion) New onset.  Persistent.  Patient is having some shortness of breath climbing stairs without orthopnea or PND.  There is no recognizable JVD and there is no gallops noted on cardiac exam.  This is probably reflecting that he is got exacerbation of his mild intermittent asthma/chronic obstructive pulmonary disease disease.  I think  3. Chronic obstructive pulmonary disease with acute exacerbation (HCC) Dyspnea on exertion probably is reflecting an early acute  exacerbation of his underlying lung disease which is followed by Dr. Yancey Flemings.  At this point in time we will add 10 days of prednisone 10 mg once a day and encouraged him to use his short acting albuterol along with continuance of his Trelegy.  Patient's only been taking this under  rescue circumstances  Encouraged him to go ahead and start at this time the short acting albuterol as that this may reflect an early exacerbation in early reactivity of his airways. - predniSONE (DELTASONE) 10 MG tablet; Take 1 tablet (10 mg total) by mouth daily with breakfast.  Dispense: 10 tablet; Refill: 0    Otilio Miu, MD

## 2022-01-28 DIAGNOSIS — H2513 Age-related nuclear cataract, bilateral: Secondary | ICD-10-CM | POA: Diagnosis not present

## 2022-02-24 ENCOUNTER — Ambulatory Visit: Payer: Medicare Other

## 2022-02-26 ENCOUNTER — Encounter (INDEPENDENT_AMBULATORY_CARE_PROVIDER_SITE_OTHER): Payer: Medicare Other

## 2022-02-26 ENCOUNTER — Other Ambulatory Visit: Payer: Self-pay | Admitting: Family Medicine

## 2022-02-26 ENCOUNTER — Ambulatory Visit (INDEPENDENT_AMBULATORY_CARE_PROVIDER_SITE_OTHER): Payer: Medicare Other | Admitting: Nurse Practitioner

## 2022-02-26 DIAGNOSIS — I10 Essential (primary) hypertension: Secondary | ICD-10-CM

## 2022-02-26 NOTE — Telephone Encounter (Signed)
Requested medication (s) are due for refill today:   Yes  Requested medication (s) are on the active medication list:   Yes  Future visit scheduled:   Yes 03/06/2022 with Dr. Ronnald Ramp for f/u on starting this medication for BP.   Last ordered: 12/26/2021 #60, 0 refills  Returned for Dr. Ronnald Ramp to determine if to continue taking this.      Requested Prescriptions  Pending Prescriptions Disp Refills   amLODipine (NORVASC) 2.5 MG tablet [Pharmacy Med Name: AMLODIPINE BESYLATE 2.5 MG TAB] 60 tablet 0    Sig: TAKE (1) TABLET BY MOUTH EVERY DAY     Cardiovascular: Calcium Channel Blockers 2 Passed - 02/26/2022  9:08 AM      Passed - Last BP in normal range    BP Readings from Last 1 Encounters:  01/23/22 132/78         Passed - Last Heart Rate in normal range    Pulse Readings from Last 1 Encounters:  01/23/22 66         Passed - Valid encounter within last 6 months    Recent Outpatient Visits           1 month ago Essential (primary) hypertension   [facility] [place], Deanna C, MD   1 month ago 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   [facility] [place], Earley Abide, MD   2 months ago Essential (primary) hypertension   [facility] [place], Deanna C, MD   6 months ago Familial multiple lipoprotein-type hyperlipidemia   [facility] [place], [place], MD   7 months ago Chronic obstructive pulmonary disease with acute exacerbation [facility])   [facility] [place], [place], MD       Future Appointments             In 1 week Juline Patch, MD [facility] at [facility], [place]   In 4 months Juline Patch, MD [facility] and [facility] Medicine at  [facility], [facility]   In 4 months [place], [facility] [place]

## 2022-03-05 ENCOUNTER — Ambulatory Visit: Payer: Medicare Other

## 2022-03-06 ENCOUNTER — Encounter: Payer: Self-pay | Admitting: Family Medicine

## 2022-03-06 ENCOUNTER — Ambulatory Visit: Payer: Medicare Other | Admitting: Family Medicine

## 2022-03-06 VITALS — BP 120/74 | HR 74 | Ht 70.0 in | Wt 181.0 lb

## 2022-03-06 DIAGNOSIS — I10 Essential (primary) hypertension: Secondary | ICD-10-CM

## 2022-03-06 MED ORDER — LOSARTAN POTASSIUM-HCTZ 100-25 MG PO TABS: 1.0000 | ORAL_TABLET | Freq: Every day | ORAL | 1 refills | Status: DC

## 2022-03-06 MED ORDER — AMLODIPINE BESYLATE 2.5 MG PO TABS: ORAL_TABLET | ORAL | 1 refills | Status: DC

## 2022-03-06 NOTE — Progress Notes (Signed)
Date:  03/06/2022   Name:  Marcus Delacruz   DOB:  Oct 19, 1940   MRN:  [id number]   Chief Complaint: Hypertension  Hypertension This is a chronic problem. The current episode started yesterday. The problem has been gradually improving since onset. The problem is controlled. Pertinent negatives include no anxiety, blurred vision, chest pain, headaches, malaise/fatigue, neck pain, orthopnea, palpitations, peripheral edema, PND, shortness of breath or sweats. There are no associated agents to hypertension. Risk factors for coronary artery disease include dyslipidemia. Past treatments include angiotensin blockers, diuretics and calcium channel blockers. The current treatment provides moderate improvement. There are no compliance problems.  There is no history of angina, kidney disease, CAD/MI, CVA, heart failure, left ventricular hypertrophy, PVD or retinopathy. There is no history of chronic renal disease, a hypertension causing med or renovascular disease.    Lab Results  Component Value Date   NA 138 12/26/2021   K 4.6 12/26/2021   CO2 26 12/26/2021   GLUCOSE 91 12/26/2021   BUN 14 12/26/2021   CREATININE 0.94 12/26/2021   CALCIUM 9.8 12/26/2021   EGFR 82 12/26/2021   GFRNONAA 49 (L) 02/20/2020   Lab Results  Component Value Date   CHOL 181 08/22/2021   HDL 62 08/22/2021   LDLCALC 101 (H) 08/22/2021   TRIG 100 08/22/2021   CHOLHDL 3.5 08/20/2017   No results found for: "TSH" No results found for: "HGBA1C" Lab Results  Component Value Date   WBC 10.8 12/20/2020   HGB 15.6 12/20/2020   HCT 46.5 12/20/2020   MCV 88 12/20/2020   PLT 312 12/20/2020   Lab Results  Component Value Date   ALT 29 08/22/2021   AST 50 (H) 12/26/2021   ALKPHOS 72 08/22/2021   BILITOT 0.8 08/22/2021   No results found for: "25OHVITD2", "25OHVITD3", "VD25OH"   Review of Systems  Constitutional:  Negative for chills, fever and malaise/fatigue.  HENT:  Negative for drooling, ear discharge,  ear pain and sore throat.   Eyes:  Negative for blurred vision.  Respiratory:  Negative for cough, shortness of breath and wheezing.   Cardiovascular:  Negative for chest pain, palpitations, orthopnea, leg swelling and PND.  Gastrointestinal:  Negative for abdominal pain, blood in stool, constipation, diarrhea and nausea.  Endocrine: Negative for polydipsia.  Genitourinary:  Negative for dysuria, frequency, hematuria and urgency.  Musculoskeletal:  Negative for back pain, myalgias and neck pain.  Skin:  Negative for rash.  Allergic/Immunologic: Negative for environmental allergies.  Neurological:  Negative for dizziness and headaches.  Hematological:  Does not bruise/bleed easily.  Psychiatric/Behavioral:  Negative for suicidal ideas. The patient is not nervous/anxious.     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 01/21/2022   Chronic obstructive pulmonary disease with acute exacerbation ([place]) 07/27/2017   Asthma, chronic, severe persistent, uncomplicated 26/37/8588   Seasonal allergic rhinitis due to pollen 08/18/2016   Bilateral carotid artery stenosis 04/03/2016   Bilateral hydrocele 11/22/2014   BPH with obstruction/lower urinary tract symptoms 10/05/2014   Epididymal cyst 10/05/2014   Familial multiple lipoprotein-type hyperlipidemia 07/14/2014   Laboratory animal allergy 07/14/2014   Acute arthropathy 07/14/2014   Routine general medical examination at a health care facility 07/14/2014   Asthma with exacerbation 07/14/2014   Essential (primary) hypertension 07/14/2014   Screening for depression 07/14/2014   Noncompliance w/medication treatment due to intermit use of medication 11/23/2013   Asthma, chronic 08/08/2013  Allergies  Allergen Reactions   Codeine Itching    Past Surgical History:  Procedure Laterality Date   ARTERY REPAIR     BACK SURGERY     CHOLECYSTECTOMY      COLONOSCOPY  2013   cleared   HERNIA REPAIR      Social History   Tobacco Use   Smoking status: Former    Packs/day: 1.00    Years: 20.00    Total pack years: 20.00    Types: Cigarettes    Quit date: 08/09/1983    Years since quitting: 38.6    Passive exposure: Past   Smokeless tobacco: Never   Tobacco comments:    quit smoking over 40 years ago  Vaping Use   Vaping Use: Never used  Substance Use Topics   Alcohol use: Yes    Alcohol/week: 0.0 standard drinks of alcohol    Comment: once monthly   Drug use: No     Medication list has been reviewed and updated.  Current Meds  Medication Sig   amLODipine (NORVASC) 2.5 MG tablet TAKE (1) TABLET BY MOUTH EVERY DAY   aspirin EC 81 MG tablet Take 1 tablet (81 mg total) by mouth daily. Swallow whole.   ezetimibe (ZETIA) 10 MG tablet TAKE (1) TABLET BY MOUTH EVERY DAY   fexofenadine (ALLERGY RELIEF) 180 MG tablet TAKE (1) TABLET BY MOUTH EVERY DAY   finasteride (PROSCAR) 5 MG tablet Take 1 tablet (5 mg total) by mouth daily.   Fluticasone-Umeclidin-Vilant (TRELEGY ELLIPTA) 100-62.5-25 MCG/ACT AEPB Inhale 1 puff into the lungs daily.   ibuprofen (ADVIL) 800 MG tablet TAKE (1) TABLET BY MOUTH THREE TIMES A DAY AS NEEDED   ipratropium-albuterol (DUONEB) 0.5-2.5 (3) MG/3ML SOLN Take 3 mLs by nebulization every 6 (six) hours as needed.   montelukast (SINGULAIR) 10 MG tablet Take 1 tablet daily   NON FORMULARY cpap device   rosuvastatin (CRESTOR) 5 MG tablet TAKE (1) TABLET BY MOUTH EVERY DAY   VENTOLIN HFA 108 (90 Base) MCG/ACT inhaler USE 2 PUFFS EVERY 6 HOURS AS NEEDED FOR SHORTNESS OF BREATH OR WHEEZING   Current Facility-Administered Medications for the 03/06/22 encounter (Office Visit) with Juline Patch, MD  Medication   ipratropium-albuterol (DUONEB) 0.5-2.5 (3) MG/3ML nebulizer solution 3 mL   ipratropium-albuterol (DUONEB) 0.5-2.5 (3) MG/3ML nebulizer solution 3 mL       12/26/2021   10:45 AM 07/02/2021   11:03 AM  06/26/2021   11:12 AM 07/12/2020    1:49 PM  GAD 7 : Generalized Anxiety Score  Nervous, Anxious, on Edge 0 0 0 1  Control/stop worrying 0 0 0 0  Worry too much - different things 0 0 0 0  Trouble relaxing 0 0 0 0  Restless 0 0 0 0  Easily annoyed or irritable 0 0 0 0  Afraid - awful might happen 0 0 0 0  Total GAD 7 Score 0 0 0 1  Anxiety Difficulty Not difficult at all Not difficult at all Not difficult at all        12/26/2021   10:44 AM 07/02/2021   11:02 AM 06/26/2021   11:12 AM  Depression screen PHQ 2/9  Decreased Interest 0 0 0  Down, Depressed, Hopeless 0 0 0  PHQ - 2 Score 0 0 0  Altered sleeping 0 0 0  Tired, decreased energy 0 1 0  Change in appetite 0 0 0  Feeling bad or failure about yourself  0 0 0  Trouble  concentrating 0 0 0  Moving slowly or fidgety/restless 0 0 0  Suicidal thoughts 0 0 0  PHQ-9 Score 0 1 0  Difficult doing work/chores Not difficult at all Not difficult at all Not difficult at all    BP Readings from Last 3 Encounters:  03/06/22 120/74  01/23/22 132/78  01/21/22 (!) 144/70    Physical Exam Vitals and nursing note reviewed.  HENT:     Head: Normocephalic.     Right Ear: Tympanic membrane and external ear normal.     Left Ear: Tympanic membrane and external ear normal.     Nose: Nose normal.     Mouth/Throat:     Mouth: Mucous membranes are moist.  Eyes:     General: No scleral icterus.       Right eye: No discharge.        Left eye: No discharge.     Conjunctiva/sclera: Conjunctivae normal.     Pupils: Pupils are equal, round, and reactive to light.  Neck:     Thyroid: No thyromegaly.     Vascular: No JVD.     Trachea: No tracheal deviation.  Cardiovascular:     Rate and Rhythm: Normal rate and regular rhythm.     Heart sounds: Normal heart sounds, S1 normal and S2 normal. No murmur heard.    No systolic murmur is present.     No diastolic murmur is present.     No friction rub. No gallop. No S3 or S4 sounds.  Pulmonary:      Effort: No respiratory distress.     Breath sounds: Normal breath sounds. No wheezing, rhonchi or rales.  Abdominal:     General: Bowel sounds are normal.     Palpations: Abdomen is soft. There is no mass.     Tenderness: There is no abdominal tenderness. There is no guarding or rebound.  Musculoskeletal:        General: No tenderness. Normal range of motion.     Cervical back: Neck supple.  Lymphadenopathy:     Cervical: No cervical adenopathy.  Skin:    General: Skin is warm.     Findings: No rash.  Neurological:     Mental Status: He is alert.     Wt Readings from Last 3 Encounters:  03/06/22 181 lb (82.1 kg)  01/23/22 181 lb (82.1 kg)  01/21/22 181 lb (82.1 kg)    BP 120/74   Pulse 74   Ht _0  (1.778 m)   Wt 181 lb (82.1 kg)   SpO2 94%   BMI 25.97 kg/m   Assessment and Plan:  1. Essential (primary) hypertension Chronic.  Controlled.  Stable.  Blood pressure 120/74.  Continue losartan hydrochlorothiazide 100-25 mg once a day and preferably in the morning and amlodipine 2.5 mg once a day.  Will recheck in 6 months we will obtain a potassium to see if the end creased in hydrochlorothiazide has adversely affected the and if so we will supplement. - losartan-hydrochlorothiazide (HYZAAR) 100-25 MG tablet; Take 1 tablet by mouth daily.  Dispense: 90 tablet; Refill: 1 - amLODipine (NORVASC) 2.5 MG tablet; TAKE (1) TABLET BY MOUTH EVERY DAY  Dispense: 90 tablet; Refill: 1 - Potassium    Otilio Miu, MD

## 2022-03-07 LAB — POTASSIUM: Potassium: 4.2 mmol/L (ref 3.5–5.2)

## 2022-03-17 ENCOUNTER — Ambulatory Visit (INDEPENDENT_AMBULATORY_CARE_PROVIDER_SITE_OTHER): Payer: Medicare Other

## 2022-03-17 DIAGNOSIS — Z0000 Encounter for general adult medical examination without abnormal findings: Secondary | ICD-10-CM | POA: Diagnosis not present

## 2022-03-17 NOTE — Progress Notes (Signed)
I connected with  [name] on 03/17/22 by a audio enabled telemedicine application and verified that I am speaking with the correct person using two identifiers.  Patient Location: Home  Provider Location: Office/Clinic  I discussed the limitations of evaluation and management by telemedicine. The patient expressed understanding and agreed to proceed.  Subjective:   Marcus Delacruz is a 82 y.o. male who presents for Medicare Annual/Subsequent preventive examination.  Review of Systems    Per HPI unless specifically indicated below.  Cardiac Risk Factors include: advanced age (>65mn, >>34women);male gender, Essential hypertension.          Objective:    There were no vitals filed for this visit. There is no height or weight on file to calculate BMI.     03/17/2022    3:55 PM 02/20/2021    1:30 PM 02/20/2020    1:50 PM 02/16/2019    2:04 PM 02/08/2018    2:43 PM 04/03/2016   11:15 AM 01/17/2015    9:14 AM  Advanced Directives  Does Patient Have a Medical Advance Directive? Yes Yes Yes Yes Yes Yes Yes  Type of AParamedicof AFlorenceLiving will HAmblerLiving will HHaydenLiving will HCreedmoorLiving will HSneadLiving will HGoldfieldLiving will Living will;Healthcare Power of Attorney  Does patient want to make changes to medical advance directive? No - Patient declined        Copy of HMead[place] Lakein Chart? No - copy requested No - copy requested No - copy requested No - copy requested No - copy requested  No - copy requested    Current Medications (verified) Outpatient Encounter Medications as of 03/17/2022  Medication Sig   amLODipine (NORVASC) 2.5 MG tablet TAKE (1) TABLET BY MOUTH EVERY DAY   aspirin EC 81 MG tablet Take 1 tablet (81 mg total) by mouth daily. Swallow whole.   ezetimibe (ZETIA) 10 MG tablet TAKE (1)  TABLET BY MOUTH EVERY DAY   finasteride (PROSCAR) 5 MG tablet Take 1 tablet (5 mg total) by mouth daily.   Fluticasone-Umeclidin-Vilant (TRELEGY ELLIPTA) 100-62.5-25 MCG/ACT AEPB Inhale 1 puff into the lungs daily.   ibuprofen (ADVIL) 800 MG tablet TAKE (1) TABLET BY MOUTH THREE TIMES A DAY AS NEEDED   ipratropium-albuterol (DUONEB) 0.5-2.5 (3) MG/3ML SOLN Take 3 mLs by nebulization every 6 (six) hours as needed.   losartan-hydrochlorothiazide (HYZAAR) 100-25 MG tablet Take 1 tablet by mouth daily.   montelukast (SINGULAIR) 10 MG tablet Take 1 tablet daily   NON FORMULARY cpap device   rosuvastatin (CRESTOR) 5 MG tablet TAKE (1) TABLET BY MOUTH EVERY DAY   VENTOLIN HFA 108 (90 Base) MCG/ACT inhaler USE 2 PUFFS EVERY 6 HOURS AS NEEDED FOR SHORTNESS OF BREATH OR WHEEZING   fexofenadine (ALLERGY RELIEF) 180 MG tablet TAKE (1) TABLET BY MOUTH EVERY DAY (Patient not taking: Reported on 03/17/2022)   Facility-Administered Encounter Medications as of 03/17/2022  Medication   ipratropium-albuterol (DUONEB) 0.5-2.5 (3) MG/3ML nebulizer solution 3 mL   ipratropium-albuterol (DUONEB) 0.5-2.5 (3) MG/3ML nebulizer solution 3 mL    Allergies (verified) Codeine   History: Past Medical History:  Diagnosis Date   Allergic rhinitis due to pollen    Asthma    BPH (benign prostatic hyperplasia)    Cardiomegaly    Chronic obstructive pulmonary disease (COPD) (HCC)    Hypercholesteremia    Hypersomnia, unspecified    Hypertension  Obstructive sleep apnea (adult) (pediatric)    Shortness of breath    Snoring    Solitary pulmonary nodule    Past Surgical History:  Procedure Laterality Date   ARTERY REPAIR     BACK SURGERY     CHOLECYSTECTOMY     COLONOSCOPY  2013   cleared   HERNIA REPAIR     Family History  Problem Relation Age of Onset   COPD Mother    Cancer Father        lung   Asthma Son    Prostate cancer Neg Hx    Kidney disease Neg Hx    Social History   Socioeconomic History    Marital status: Married    Spouse name: Not on file   Number of children: 3   Years of education: Not on file   Highest education level: Associate degree: academic program  Occupational History   Occupation: retired  Tobacco Use   Smoking status: Former    Packs/day: 1.00    Years: 20.00    Total pack years: 20.00    Types: Cigarettes    Quit date: 08/09/1983    Years since quitting: 38.6    Passive exposure: Past   Smokeless tobacco: Never   Tobacco comments:    quit smoking over 40 years ago  Vaping Use   Vaping Use: Never used  Substance and Sexual Activity   Alcohol use: Yes    Alcohol/week: 0.0 standard drinks of alcohol    Comment: once monthly   Drug use: No   Sexual activity: Not Currently  Other Topics Concern   Not on file  Social History Narrative   Not on file   Social Determinants of Health   Financial Resource Strain: Low Risk  (03/17/2022)   Overall Financial Resource Strain (CARDIA)    Difficulty of Paying Living Expenses: Not hard at all  Food Insecurity: No Food Insecurity (03/17/2022)   Hunger Vital Sign    Worried About Running Out of Food in the Last Year: Never true    Ran Out of Food in the Last Year: Never true  Transportation Needs: No Transportation Needs (03/17/2022)   PRAPARE - Hydrologist (Medical): No    Lack of Transportation (Non-Medical): No  Physical Activity: Insufficiently Active (03/17/2022)   Exercise Vital Sign    Days of Exercise per Week: 1 day    Minutes of Exercise per Session: 30 min  Stress: No Stress Concern Present (03/17/2022)   [place]    Feeling of Stress : Not at all  Social Connections: Moderately Isolated (03/17/2022)   Social Connection and Isolation Panel [NHANES]    Frequency of Communication with Friends and Family: More than three times a week    Frequency of Social Gatherings with Friends and Family: Once a week     Attends Religious Services: Never    Marine scientist or Organizations: No    Attends Music therapist: Never    Marital Status: Married    Tobacco Counseling Counseling given: No Tobacco comments: quit smoking over 40 years ago   Clinical Intake:  Pre-visit preparation completed: No  Pain : No/denies pain     Nutritional Status: BMI of 19-24  Normal Nutritional Risks: None Diabetes: No  How often do you need to have someone help you when you read instructions, pamphlets, or other written materials from your doctor or pharmacy?: 1 -  Never  Diabetic?No  Interpreter Needed?: No  Information entered by :: Donnie Mesa, CMA   Activities of Daily Living    03/17/2022    3:38 PM  In your present state of health, do you have any difficulty performing the following activities:  Hearing? 1  Vision? 1  Difficulty concentrating or making decisions? 0  Walking or climbing stairs? 0  Dressing or bathing? 0  Doing errands, shopping? 0    Patient Care Team: Juline Patch, MD as PCP - General (Family Medicine) Lucky Cowboy, Erskine Squibb, MD as Referring Physician (Vascular Surgery) Ree Edman, MD as Referring Physician (Dermatology) Jonetta Osgood, NP as Nurse Practitioner (Sleep Medicine) Michael Boston, MD as Consulting Physician (General Surgery) Clarene Essex, MD as Consulting Physician (Gastroenterology)  Indicate any recent Medical Services you may have received from other than [facility] providers in the past year (date may be approximate). The pt was seen for his OSA on 01/22/2022 by Corlis Hove, [facility].    Assessment:   This is a routine wellness examination for [place].  Hearing/Vision screen Hearing eval done at [facility]; pt does qualify for hearing aids due to difficulty with high pitched tones but declines due to cost   Vision Screening - Comments:: Annual vision screenings done at [facility] issues and exercise activities  discussed: Current Exercise Habits: Structured exercise class, Type of exercise: walking, Time (Minutes): 30, Frequency (Times/Week): 1, Weekly Exercise (Minutes/Week): 30, Intensity: Mild   Goals Addressed   None    Depression Screen    03/17/2022    3:38 PM 12/26/2021   10:44 AM 07/02/2021   11:02 AM 06/26/2021   11:12 AM 02/20/2021    1:29 PM 07/12/2020    1:49 PM 02/20/2020    1:37 PM  PHQ 2/9 Scores  PHQ - 2 Score 0 0 0 0 0 0 0  PHQ- 9 Score  0 1 0  0     Fall Risk    03/17/2022    3:38 PM 12/26/2021   10:44 AM 02/20/2021    1:31 PM 07/12/2020    1:45 PM 02/20/2020    1:38 PM  [place] in the past year? 0 0 0 0 0  Number falls in past yr: 0 0 0  0  Injury with Fall? 0 0 0  0  Risk for fall due to : No Fall Risks No Fall Risks No Fall Risks  No Fall Risks  Follow up Falls evaluation completed Falls evaluation completed Falls prevention discussed Falls evaluation completed Falls prevention discussed    FALL RISK PREVENTION PERTAINING TO THE HOME:  Any stairs in or around the home? Yes  If so, are there any without handrails? No  Home free of loose throw rugs in walkways, pet beds, electrical cords, etc? Yes  Adequate lighting in your home to reduce risk of falls? Yes   ASSISTIVE DEVICES UTILIZED TO PREVENT FALLS:  Life alert? No  Use of a cane, walker or w/c? No  Grab bars in the bathroom? No  Shower chair or bench in shower? No  Elevated toilet seat or a handicapped toilet? Yes   TIMED UP AND GO:  Was the test performed?  unable to perform, virtual telephonic visit  .  Cognitive Function:        03/17/2022    3:39 PM 02/20/2020    1:40 PM  6CIT Screen  What Year? 0 points 0 points  What month? 0 points 0  points  What time? 0 points 0 points  Count back from 20 0 points 0 points  Months in reverse 0 points 0 points  Repeat phrase 2 points 0 points  Total Score 2 points 0 points    Immunizations Immunization History  Administered Date(s)  Administered   Influenza Split 12/08/2012   Influenza, High Dose Seasonal PF 12/07/2017, 11/30/2019   Influenza,inj,Quad PF,6+ Mos 01/06/2013, 11/23/2013   Influenza,inj,quad, With Preservative 12/24/2016, 11/25/2018   Influenza-Unspecified 01/21/2017, 12/04/2020, 12/18/2021   Moderna Sars-Covid-2 Vaccination 12/24/2020   PFIZER(Purple Top)SARS-COV-2 Vaccination 03/16/2019, 04/06/2019, 12/02/2019, 06/30/2020   Pneumococcal Conjugate-13 01/17/2015   Pneumococcal Polysaccharide-23 06/27/2010   Tdap 10/05/2011   Zoster Recombinat (Shingrix) 02/06/2018, 04/19/2018    TDAP status: Due, Education has been provided regarding the importance of this vaccine. Advised may receive this vaccine at local pharmacy or Health Dept. Aware to provide a copy of the vaccination record if obtained from local pharmacy or Health Dept. Verbalized acceptance and understanding.  Flu Vaccine status: Up to date  Pneumococcal vaccine status: Up to date  Covid-19 vaccine status: Information provided on how to obtain vaccines.   Qualifies for Shingles Vaccine? Yes   Zostavax completed Yes   Shingrix Completed?: Yes  Screening Tests Health Maintenance  Topic Date Due   COVID-19 Vaccine (6 - 2023-24 season) 03/22/2022 (Originally 11/08/2021)   Medicare Annual Wellness (AWV)  03/18/2023   Pneumonia Vaccine 25+ Years old  Completed   INFLUENZA VACCINE  Completed   Zoster Vaccines- Shingrix  Completed   HPV VACCINES  Aged Out   DTaP/Tdap/Td  Discontinued    Health Maintenance  There are no preventive care reminders to display for this patient.   Colorectal cancer screening: No longer required.   Lung Cancer Screening: (Low Dose CT Chest recommended if Age 36-80 years, 30 pack-year currently smoking OR have quit w/in 15years.) does not qualify.   Lung Cancer Screening Referral: not applicable   Additional Screening:  Hepatitis C Screening: does not qualify  Vision Screening: Recommended annual  ophthalmology exams for early detection of glaucoma and other disorders of the eye. Is the patient up to date with their annual eye exam?  Yes  Who is the provider or what is the name of the office in which the patient attends annual eye exams? [facility]  If pt is not established with a provider, would they like to be referred to a provider to establish care? No .   Dental Screening: Recommended annual dental exams for proper oral hygiene  Community Resource Referral / Chronic Care Management: CRR required this visit?  No   CCM required this visit?  No      Plan:     I have personally reviewed and noted the following in the patient's chart:   Medical and social history Use of alcohol, tobacco or illicit drugs  Current medications and supplements including opioid prescriptions. Patient is not currently taking opioid prescriptions. Functional ability and status Nutritional status Physical activity Advanced directives List of other physicians Hospitalizations, surgeries, and ER visits in previous 12 months Vitals Screenings to include cognitive, depression, and falls Referrals and appointments  In addition, I have reviewed and discussed with patient certain preventive protocols, quality metrics, and best practice recommendations. A written personalized care plan for preventive services as well as general preventive health recommendations were provided to patient.    Mr. Loconte , Thank you for taking time to come for your Medicare Wellness Visit. I appreciate your ongoing commitment  to your health goals. Please review the following plan we discussed and let me know if I can assist you in the future.   These are the goals we discussed:  Goals      DIET - EAT MORE FRUITS AND VEGETABLES     Continue healthy eating habits with 3-4 servings of fruits and vegetables per day.     Increase physical activity     Recommend 150 minutes of physical activity per week         This is a list of the screening recommended for you and due dates:  Health Maintenance  Topic Date Due   COVID-19 Vaccine (6 - 2023-24 season) 03/22/2022*   Medicare Annual Wellness Visit  03/18/2023   Pneumonia Vaccine  Completed   Flu Shot  Completed   Zoster (Shingles) Vaccine  Completed   HPV Vaccine  Aged Out   DTaP/Tdap/Td vaccine  Discontinued  *Topic was postponed. The date shown is not the original due date.     Wilson Singer, [place]   03/17/2022   Nurse Notes: Approximately 30 minute Non-Face -To-Face Medicare Wellness Visit

## 2022-03-17 NOTE — Patient Instructions (Signed)
Health Maintenance, Male Adopting a healthy lifestyle and getting preventive care are important in promoting health and wellness. Ask your health care provider about: The right schedule for you to have regular tests and exams. Things you can do on your own to prevent diseases and keep yourself healthy. What should I know about diet, weight, and exercise? Eat a healthy diet  Eat a diet that includes plenty of vegetables, fruits, low-fat dairy products, and lean protein. Do not eat a lot of foods that are high in solid fats, added sugars, or sodium. Maintain a healthy weight Body mass index (BMI) is a measurement that can be used to identify possible weight problems. It estimates body fat based on height and weight. Your health care provider can help determine your BMI and help you achieve or maintain a healthy weight. Get regular exercise Get regular exercise. This is one of the most important things you can do for your health. Most adults should: Exercise for at least 150 minutes each week. The exercise should increase your heart rate and make you sweat (moderate-intensity exercise). Do strengthening exercises at least twice a week. This is in addition to the moderate-intensity exercise. Spend less time sitting. Even light physical activity can be beneficial. Watch cholesterol and blood lipids Have your blood tested for lipids and cholesterol at 81 years of age, then have this test every 5 years. You may need to have your cholesterol levels checked more often if: Your lipid or cholesterol levels are high. You are older than 81 years of age. You are at high risk for heart disease. What should I know about cancer screening? Many types of cancers can be detected early and may often be prevented. Depending on your health history and family history, you may need to have cancer screening at various ages. This may include screening for: Colorectal cancer. Prostate cancer. Skin cancer. Lung  cancer. What should I know about heart disease, diabetes, and high blood pressure? Blood pressure and heart disease High blood pressure causes heart disease and increases the risk of stroke. This is more likely to develop in people who have high blood pressure readings or are overweight. Talk with your health care provider about your target blood pressure readings. Have your blood pressure checked: Every 3-5 years if you are 18-39 years of age. Every year if you are 40 years old or older. If you are between the ages of 65 and 75 and are a current or former smoker, ask your health care provider if you should have a one-time screening for abdominal aortic aneurysm (AAA). Diabetes Have regular diabetes screenings. This checks your fasting blood sugar level. Have the screening done: Once every three years after age 45 if you are at a normal weight and have a low risk for diabetes. More often and at a younger age if you are overweight or have a high risk for diabetes. What should I know about preventing infection? Hepatitis B If you have a higher risk for hepatitis B, you should be screened for this virus. Talk with your health care provider to find out if you are at risk for hepatitis B infection. Hepatitis C Blood testing is recommended for: Everyone born from 1945 through 1965. Anyone with known risk factors for hepatitis C. Sexually transmitted infections (STIs) You should be screened each year for STIs, including gonorrhea and chlamydia, if: You are sexually active and are younger than 81 years of age. You are older than 81 years of age and your   health care provider tells you that you are at risk for this type of infection. Your sexual activity has changed since you were last screened, and you are at increased risk for chlamydia or gonorrhea. Ask your health care provider if you are at risk. Ask your health care provider about whether you are at high risk for HIV. Your health care provider  may recommend a prescription medicine to help prevent HIV infection. If you choose to take medicine to prevent HIV, you should first get tested for HIV. You should then be tested every 3 months for as long as you are taking the medicine. Follow these instructions at home: Alcohol use Do not drink alcohol if your health care provider tells you not to drink. If you drink alcohol: Limit how much you have to 0-2 drinks a day. Know how much alcohol is in your drink. In the [place] one drink equals one 12 oz bottle of beer (355 mL), one 5 oz glass of wine (148 mL), or one 1 oz glass of hard liquor (44 mL). Lifestyle Do not use any products that contain nicotine or tobacco. These products include cigarettes, chewing tobacco, and vaping devices, such as e-cigarettes. If you need help quitting, ask your health care provider. Do not use street drugs. Do not share needles. Ask your health care provider for help if you need support or information about quitting drugs. General instructions Schedule regular health, dental, and eye exams. Stay current with your vaccines. Tell your health care provider if: You often feel depressed. You have ever been abused or do not feel safe at home. Summary Adopting a healthy lifestyle and getting preventive care are important in promoting health and wellness. Follow your health care provider's instructions about healthy diet, exercising, and getting tested or screened for diseases. Follow your health care provider's instructions on monitoring your cholesterol and blood pressure. This information is not intended to replace advice given to you by your health care provider. Make sure you discuss any questions you have with your health care provider. Document Revised: 07/16/2020 Document Reviewed: 07/16/2020 Elsevier Patient Education  2023 Elsevier Inc.  

## 2022-03-19 ENCOUNTER — Other Ambulatory Visit (INDEPENDENT_AMBULATORY_CARE_PROVIDER_SITE_OTHER): Payer: Self-pay | Admitting: Nurse Practitioner

## 2022-03-19 DIAGNOSIS — I6523 Occlusion and stenosis of bilateral carotid arteries: Secondary | ICD-10-CM

## 2022-03-25 ENCOUNTER — Other Ambulatory Visit: Payer: Self-pay

## 2022-03-25 DIAGNOSIS — R0602 Shortness of breath: Secondary | ICD-10-CM

## 2022-03-26 ENCOUNTER — Ambulatory Visit (INDEPENDENT_AMBULATORY_CARE_PROVIDER_SITE_OTHER): Payer: Medicare Other

## 2022-03-26 ENCOUNTER — Encounter (INDEPENDENT_AMBULATORY_CARE_PROVIDER_SITE_OTHER): Payer: Self-pay | Admitting: Nurse Practitioner

## 2022-03-26 ENCOUNTER — Ambulatory Visit (INDEPENDENT_AMBULATORY_CARE_PROVIDER_SITE_OTHER): Payer: Medicare Other | Admitting: Nurse Practitioner

## 2022-03-26 VITALS — BP 154/77 | HR 60 | Resp 16 | Wt 186.0 lb

## 2022-03-26 DIAGNOSIS — I6523 Occlusion and stenosis of bilateral carotid arteries: Secondary | ICD-10-CM | POA: Diagnosis not present

## 2022-03-26 DIAGNOSIS — E7849 Other hyperlipidemia: Secondary | ICD-10-CM | POA: Diagnosis not present

## 2022-03-26 DIAGNOSIS — I10 Essential (primary) hypertension: Secondary | ICD-10-CM | POA: Diagnosis not present

## 2022-03-26 NOTE — Progress Notes (Signed)
Subjective:    Patient ID: Marcus Delacruz, male    DOB: [birth year], 82 y.o.   MRN: [id number] Chief Complaint  Patient presents with   Follow-up    Ultrasound follow up    The patient is seen for follow up evaluation of carotid stenosis. The carotid stenosis followed by ultrasound.  The patient has had a previous right carotid endarterectomy in 2018.  The patient denies amaurosis fugax. There is no recent history of TIA symptoms or focal motor deficits. There is no prior documented CVA.  The patient is taking enteric-coated aspirin 81 mg daily.  There is no history of migraine headaches. There is no history of seizures.  No recent shortening of the patient's walking distance or new symptoms consistent with claudication.  No history of rest pain symptoms. No new ulcers or wounds of the lower extremities have occurred.  There is no history of DVT, PE or superficial thrombophlebitis. No recent episodes of angina or shortness of breath documented.   Carotid Duplex done today shows no significant stenosis of the right ICA.  40 to 59% stenosis of the left ICA.  Bilateral vertebral arteries have antegrade flow.  Normal flow hemodynamics in the bilateral subclavian arteries.  This is fairly consistent with the previous studies done in 2022    Review of Systems  All other systems reviewed and are negative.      Objective:   Physical Exam Vitals reviewed.  HENT:     Head: Normocephalic.  Neck:     Vascular: No carotid bruit.  Cardiovascular:     Rate and Rhythm: Normal rate.     Pulses: Normal pulses.  Pulmonary:     Effort: Pulmonary effort is normal.  Skin:    General: Skin is warm and dry.  Neurological:     Mental Status: He is alert.  Psychiatric:        Mood and Affect: Mood normal.        Behavior: Behavior normal.        Thought Content: Thought content normal.        Judgment: Judgment normal.     BP (!) 154/77 (BP Location: Right Arm)   Pulse 60   Resp  16   Wt 186 lb (84.4 kg)   BMI 26.69 kg/m   Past Medical History:  Diagnosis Date   Allergic rhinitis due to pollen    Asthma    BPH (benign prostatic hyperplasia)    Cardiomegaly    Chronic obstructive pulmonary disease (COPD) (HCC)    Hypercholesteremia    Hypersomnia, unspecified    Hypertension    Obstructive sleep apnea (adult) (pediatric)    Shortness of breath    Snoring    Solitary pulmonary nodule     Social History   Socioeconomic History   Marital status: Married    Spouse name: Not on file   Number of children: 3   Years of education: Not on file   Highest education level: Associate degree: academic program  Occupational History   Occupation: retired  Tobacco Use   Smoking status: Former    Packs/day: 1.00    Years: 20.00    Total pack years: 20.00    Types: Cigarettes    Quit date: 08/09/1983    Years since quitting: 38.6    Passive exposure: Past   Smokeless tobacco: Never   Tobacco comments:    quit smoking over 40 years ago  Vaping Use   Vaping Use: Never used  Substance and Sexual Activity   Alcohol use: Yes    Alcohol/week: 0.0 standard drinks of alcohol    Comment: once monthly   Drug use: No   Sexual activity: Not Currently  Other Topics Concern   Not on file  Social History Narrative   Not on file   Social Determinants of Health   Financial Resource Strain: Low Risk  (03/17/2022)   Overall Financial Resource Strain (CARDIA)    Difficulty of Paying Living Expenses: Not hard at all  Food Insecurity: No Food Insecurity (03/17/2022)   Hunger Vital Sign    Worried About Running Out of Food in the Last Year: Never true    Ran Out of Food in the Last Year: Never true  Transportation Needs: No Transportation Needs (03/17/2022)   PRAPARE - Hydrologist (Medical): No    Lack of Transportation (Non-Medical): No  Physical Activity: Insufficiently Active (03/17/2022)   Exercise Vital Sign    Days of Exercise per Week: 1  day    Minutes of Exercise per Session: 30 min  Stress: No Stress Concern Present (03/17/2022)   [place]    Feeling of Stress : Not at all  Social Connections: Moderately Isolated (03/17/2022)   Social Connection and Isolation Panel [NHANES]    Frequency of Communication with Friends and Family: More than three times a week    Frequency of Social Gatherings with Friends and Family: Once a week    Attends Religious Services: Never    Marine scientist or Organizations: No    Attends Archivist Meetings: Never    Marital Status: Married  Human resources officer Violence: Not At Risk (03/17/2022)   Humiliation, Afraid, Rape, and Kick questionnaire    Fear of Current or Ex-Partner: No    Emotionally Abused: No    Physically Abused: No    Sexually Abused: No    Past Surgical History:  Procedure Laterality Date   ARTERY REPAIR     BACK SURGERY     CHOLECYSTECTOMY     COLONOSCOPY  2013   cleared   HERNIA REPAIR      Family History  Problem Relation Age of Onset   COPD Mother    Cancer Father        lung   Asthma Son    Prostate cancer Neg Hx    Kidney disease Neg Hx     Allergies  Allergen Reactions   Codeine Itching       Latest Ref Rng & Units 12/20/2020   11:15 AM 07/10/2014   12:00 AM 08/08/2013    4:27 PM  CBC  WBC 3.4 - 10.8 x10E3/uL 10.8   8.2   Hemoglobin 13.0 - 17.7 g/dL 15.6  15.7  14.7   Hematocrit 37.5 - 51.0 % 46.5   43.3   Platelets 150 - 450 x10E3/uL 312   354.0       CMP     Component Value Date/Time   NA 138 12/26/2021 1130   K 4.2 03/06/2022 1516   CL 97 12/26/2021 1130   CO2 26 12/26/2021 1130   GLUCOSE 91 12/26/2021 1130   BUN 14 12/26/2021 1130   CREATININE 0.94 12/26/2021 1130   CALCIUM 9.8 12/26/2021 1130   PROT 7.1 08/22/2021 1045   ALBUMIN 4.8 12/26/2021 1130   AST 50 (H) 12/26/2021 1130   ALT 29 08/22/2021 1045   ALKPHOS 72 08/22/2021 1045  BILITOT 0.8  08/22/2021 1045   GFRNONAA 49 (L) 02/20/2020 1639   GFRAA 56 (L) 02/20/2020 1639     No results found.     Assessment & Plan:   1. Bilateral carotid artery stenosis Recommend:  Given the patient's asymptomatic subcritical stenosis no further invasive testing or surgery at this time.  Duplex ultrasound shows no significant stenosis of the right ICA with 40 to 59% stenosis of the left ICA  Continue antiplatelet therapy as prescribed Continue management of CAD, HTN and Hyperlipidemia Healthy heart diet,  encouraged exercise at least 4 times per week Follow up in 12 months with duplex ultrasound and physical exam   2. Essential (primary) hypertension Continue antihypertensive medications as already ordered, these medications have been reviewed and there are no changes at this time.  3. Familial multiple lipoprotein-type hyperlipidemia Continue statin as ordered and reviewed, no changes at this time   Current Outpatient Medications on File Prior to Visit  Medication Sig Dispense Refill   amLODipine (NORVASC) 2.5 MG tablet TAKE (1) TABLET BY MOUTH EVERY DAY 90 tablet 1   aspirin EC 81 MG tablet Take 1 tablet (81 mg total) by mouth daily. Swallow whole. 90 tablet 12   ezetimibe (ZETIA) 10 MG tablet TAKE (1) TABLET BY MOUTH EVERY DAY 90 tablet 1   finasteride (PROSCAR) 5 MG tablet Take 1 tablet (5 mg total) by mouth daily. 90 tablet 3   Fluticasone-Umeclidin-Vilant (TRELEGY ELLIPTA) 100-62.5-25 MCG/ACT AEPB Inhale 1 puff into the lungs daily. 180 each 3   ibuprofen (ADVIL) 800 MG tablet TAKE (1) TABLET BY MOUTH THREE TIMES A DAY AS NEEDED 90 tablet 0   ipratropium-albuterol (DUONEB) 0.5-2.5 (3) MG/3ML SOLN Take 3 mLs by nebulization every 6 (six) hours as needed. 360 mL 1   losartan-hydrochlorothiazide (HYZAAR) 100-25 MG tablet Take 1 tablet by mouth daily. 90 tablet 1   montelukast (SINGULAIR) 10 MG tablet Take 1 tablet daily 90 tablet 1   NON FORMULARY cpap device     rosuvastatin  (CRESTOR) 5 MG tablet TAKE (1) TABLET BY MOUTH EVERY DAY 90 tablet 1   VENTOLIN HFA 108 (90 Base) MCG/ACT inhaler USE 2 PUFFS EVERY 6 HOURS AS NEEDED FOR SHORTNESS OF BREATH OR WHEEZING 18 g 1   fexofenadine (ALLERGY RELIEF) 180 MG tablet TAKE (1) TABLET BY MOUTH EVERY DAY (Patient not taking: Reported on 03/17/2022) 90 tablet 1   Current Facility-Administered Medications on File Prior to Visit  Medication Dose Route Frequency Provider Last Rate Last Admin   ipratropium-albuterol (DUONEB) 0.5-2.5 (3) MG/3ML nebulizer solution 3 mL  3 mL Nebulization Once Jones, Deanna C, MD       ipratropium-albuterol (DUONEB) 0.5-2.5 (3) MG/3ML nebulizer solution 3 mL  3 mL Nebulization Once Juline Patch, MD        There are no Patient Instructions on file for this visit. No follow-ups on file.   Kris Hartmann, NP

## 2022-04-02 ENCOUNTER — Ambulatory Visit: Payer: Medicare Other | Admitting: Internal Medicine

## 2022-04-07 ENCOUNTER — Other Ambulatory Visit: Payer: Self-pay

## 2022-04-07 DIAGNOSIS — J4550 Severe persistent asthma, uncomplicated: Secondary | ICD-10-CM

## 2022-04-07 MED ORDER — ALBUTEROL SULFATE HFA 108 (90 BASE) MCG/ACT IN AERS: INHALATION_SPRAY | RESPIRATORY_TRACT | 1 refills | Status: DC

## 2022-04-15 ENCOUNTER — Other Ambulatory Visit: Payer: Self-pay

## 2022-04-15 DIAGNOSIS — G8929 Other chronic pain: Secondary | ICD-10-CM

## 2022-04-15 MED ORDER — IBUPROFEN 800 MG PO TABS: ORAL_TABLET | ORAL | 0 refills | Status: DC

## 2022-04-16 ENCOUNTER — Ambulatory Visit: Payer: Medicare Other | Admitting: Internal Medicine

## 2022-04-16 DIAGNOSIS — R0602 Shortness of breath: Secondary | ICD-10-CM | POA: Diagnosis not present

## 2022-04-16 DIAGNOSIS — G4733 Obstructive sleep apnea (adult) (pediatric): Secondary | ICD-10-CM | POA: Diagnosis not present

## 2022-04-17 ENCOUNTER — Ambulatory Visit (INDEPENDENT_AMBULATORY_CARE_PROVIDER_SITE_OTHER): Payer: Medicare Other | Admitting: Nurse Practitioner

## 2022-04-17 ENCOUNTER — Encounter: Payer: Self-pay | Admitting: Nurse Practitioner

## 2022-04-17 VITALS — BP 130/60 | HR 73 | Temp 97.5°F | Resp 16 | Ht 70.0 in | Wt 183.6 lb

## 2022-04-17 DIAGNOSIS — G4733 Obstructive sleep apnea (adult) (pediatric): Secondary | ICD-10-CM

## 2022-04-17 DIAGNOSIS — J449 Chronic obstructive pulmonary disease, unspecified: Secondary | ICD-10-CM

## 2022-04-17 MED ORDER — FLUTICASONE-UMECLIDIN-VILANT 100-62.5-25 MCG/ACT IN AEPB: 1.0000 | INHALATION_SPRAY | Freq: Every day | RESPIRATORY_TRACT | 3 refills | Status: DC

## 2022-04-17 NOTE — Progress Notes (Signed)
[facility] [place], [place] [address]  Internal MEDICINE  Office Visit Note  Patient Name: Marcus Delacruz  [id number]  [id number]  Date of Service: 04/17/2022  Chief Complaint  Patient presents with   Follow-up    Review pft   Hyperlipidemia   Hypertension    HPI Marcus Delacruz presents for a follow up visit for OSA, CPAP use and to review PFT.  COPD -- no significant changes. Still using trelegy inhaler. No changes in PFT results when compared to last year's results.  OSA with CPAP use- still sleeping well, see his last CPAP download: CPAP download: 95 percentile pressure 11 95th percentile leak 2.3 apnea index 0.9 /hr apnea-hypopnea index  2.2 /hr total days used  >4 hr 55 days total days used <4 hr 0 days Total compliance 61 percent but 100% with use of old CPAP machine .    He is doing great, the days not used on this cpap he uses his old one out of town. No problems or questions at this time.  Pt was seen by Claiborne Billings  RRT/RCP  from [facility]     Current Medication: Outpatient Encounter Medications as of 04/17/2022  Medication Sig   albuterol (VENTOLIN HFA) 108 (90 Base) MCG/ACT inhaler USE 2 PUFFS EVERY 6 HOURS AS NEEDED FOR SHORTNESS OF BREATH OR WHEEZING   amLODipine (NORVASC) 2.5 MG tablet TAKE (1) TABLET BY MOUTH EVERY DAY   aspirin EC 81 MG tablet Take 1 tablet (81 mg total) by mouth daily. Swallow whole.   ezetimibe (ZETIA) 10 MG tablet TAKE (1) TABLET BY MOUTH EVERY DAY   fexofenadine (ALLERGY RELIEF) 180 MG tablet TAKE (1) TABLET BY MOUTH EVERY DAY   finasteride (PROSCAR) 5 MG tablet Take 1 tablet (5 mg total) by mouth daily.   ibuprofen (ADVIL) 800 MG tablet TAKE (1) TABLET BY MOUTH THREE TIMES A DAY AS NEEDED   ipratropium-albuterol (DUONEB) 0.5-2.5 (3) MG/3ML SOLN Take 3 mLs by nebulization every 6 (six) hours as needed.   losartan-hydrochlorothiazide (HYZAAR) 100-25 MG tablet Take 1 tablet by mouth daily.   montelukast (SINGULAIR) 10 MG  tablet Take 1 tablet daily   NON FORMULARY cpap device   rosuvastatin (CRESTOR) 5 MG tablet TAKE (1) TABLET BY MOUTH EVERY DAY   [DISCONTINUED] Fluticasone-Umeclidin-Vilant (TRELEGY ELLIPTA) 100-62.5-25 MCG/ACT AEPB Inhale 1 puff into the lungs daily.   Fluticasone-Umeclidin-Vilant (TRELEGY ELLIPTA) 100-62.5-25 MCG/ACT AEPB Inhale 1 puff into the lungs daily.   [DISCONTINUED] Fluticasone-Umeclidin-Vilant (TRELEGY ELLIPTA) 100-62.5-25 MCG/ACT AEPB Inhale 1 puff into the lungs daily.   [DISCONTINUED] ipratropium-albuterol (DUONEB) 0.5-2.5 (3) MG/3ML nebulizer solution 3 mL    [DISCONTINUED] ipratropium-albuterol (DUONEB) 0.5-2.5 (3) MG/3ML nebulizer solution 3 mL    No facility-administered encounter medications on file as of 04/17/2022.    Surgical History: Past Surgical History:  Procedure Laterality Date   ARTERY REPAIR     BACK SURGERY     CHOLECYSTECTOMY     COLONOSCOPY  2013   cleared   HERNIA REPAIR      Medical History: Past Medical History:  Diagnosis Date   Allergic rhinitis due to pollen    Asthma    BPH (benign prostatic hyperplasia)    Cardiomegaly    Chronic obstructive pulmonary disease (COPD) (HCC)    Hypercholesteremia    Hypersomnia, unspecified    Hypertension    Obstructive sleep apnea (adult) (pediatric)    Shortness of breath    Snoring    Solitary pulmonary nodule  Family History: Family History  Problem Relation Age of Onset   COPD Mother    Cancer Father        lung   Asthma Son    Prostate cancer Neg Hx    Kidney disease Neg Hx     Social History   Socioeconomic History   Marital status: Married    Spouse name: Not on file   Number of children: 3   Years of education: Not on file   Highest education level: Associate degree: academic program  Occupational History   Occupation: retired  Tobacco Use   Smoking status: Former    Packs/day: 1.00    Years: 20.00    Total pack years: 20.00    Types: Cigarettes    Quit date: 08/09/1983     Years since quitting: 38.7    Passive exposure: Past   Smokeless tobacco: Never   Tobacco comments:    quit smoking over 40 years ago  Vaping Use   Vaping Use: Never used  Substance and Sexual Activity   Alcohol use: Yes    Alcohol/week: 0.0 standard drinks of alcohol    Comment: once monthly   Drug use: No   Sexual activity: Not Currently  Other Topics Concern   Not on file  Social History Narrative   Not on file   Social Determinants of Health   Financial Resource Strain: Low Risk  (03/17/2022)   Overall Financial Resource Strain (CARDIA)    Difficulty of Paying Living Expenses: Not hard at all  Food Insecurity: No Food Insecurity (03/17/2022)   Hunger Vital Sign    Worried About Running Out of Food in the Last Year: Never true    Ran Out of Food in the Last Year: Never true  Transportation Needs: No Transportation Needs (03/17/2022)   PRAPARE - Hydrologist (Medical): No    Lack of Transportation (Non-Medical): No  Physical Activity: Insufficiently Active (03/17/2022)   Exercise Vital Sign    Days of Exercise per Week: 1 day    Minutes of Exercise per Session: 30 min  Stress: No Stress Concern Present (03/17/2022)   [place]    Feeling of Stress : Not at all  Social Connections: Moderately Isolated (03/17/2022)   Social Connection and Isolation Panel [NHANES]    Frequency of Communication with Friends and Family: More than three times a week    Frequency of Social Gatherings with Friends and Family: Once a week    Attends Religious Services: Never    Marine scientist or Organizations: No    Attends Archivist Meetings: Never    Marital Status: Married  Human resources officer Violence: Not At Risk (03/17/2022)   Humiliation, Afraid, Rape, and Kick questionnaire    Fear of Current or Ex-Partner: No    Emotionally Abused: No    Physically Abused: No    Sexually Abused: No       Review of Systems  Constitutional:  Negative for chills, fatigue and unexpected weight change.  HENT:  Negative for congestion, postnasal drip, rhinorrhea, sneezing and sore throat.   Eyes:  Negative for redness.  Respiratory: Negative.  Negative for cough, chest tightness, shortness of breath and wheezing.   Cardiovascular: Negative.  Negative for chest pain and palpitations.  Gastrointestinal:  Negative for abdominal pain, constipation, diarrhea, nausea and vomiting.  Genitourinary:  Negative for dysuria and frequency.  Musculoskeletal:  Negative for arthralgias,  back pain, joint swelling and neck pain.  Skin:  Negative for rash.  Neurological: Negative.  Negative for tremors and numbness.  Hematological:  Negative for adenopathy. Does not bruise/bleed easily.  Psychiatric/Behavioral:  Negative for behavioral problems (Depression), sleep disturbance and suicidal ideas. The patient is not nervous/anxious.     Vital Signs: BP 130/60   Pulse 73   Temp (!) 97.5 F (36.4 C)   Resp 16   Ht '5\' 10"'$  (1.778 m)   Wt 183 lb 9.6 oz (83.3 kg)   SpO2 95%   BMI 26.34 kg/m    Physical Exam Vitals reviewed.  Constitutional:      General: He is not in acute distress.    Appearance: Normal appearance. He is normal weight. He is not ill-appearing.  HENT:     Head: Normocephalic and atraumatic.  Eyes:     Pupils: Pupils are equal, round, and reactive to light.  Cardiovascular:     Rate and Rhythm: Normal rate and regular rhythm.     Pulses: Normal pulses.     Heart sounds: Normal heart sounds. No murmur heard. Pulmonary:     Effort: Pulmonary effort is normal. No respiratory distress.     Breath sounds: Normal breath sounds. No wheezing or rhonchi.  Neurological:     Mental Status: He is alert and oriented to person, place, and time.     Cranial Nerves: No cranial nerve deficit.     Coordination: Coordination normal.     Gait: Gait normal.  Psychiatric:        Mood and Affect:  Mood normal.        Behavior: Behavior normal.        Assessment/Plan: 1. Chronic obstructive pulmonary disease, unspecified COPD type ([place]) Refills of trelegy ordered.  - Fluticasone-Umeclidin-Vilant (TRELEGY ELLIPTA) 100-62.5-25 MCG/ACT AEPB; Inhale 1 puff into the lungs daily.  Dispense: 180 each; Refill: 3  2. OSA on CPAP No concerns or issues, continue CPAP use as instructed. Does not need any supplies right now.    General Counseling: Wallis verbalizes understanding of the findings of todays visit and agrees with plan of treatment. I have discussed any further diagnostic evaluation that may be needed or ordered today. We also reviewed his medications today. he has been encouraged to call the office with any questions or concerns that should arise related to todays visit.    No orders of the defined types were placed in this encounter.   Meds ordered this encounter  Medications   DISCONTD: Fluticasone-Umeclidin-Vilant (TRELEGY ELLIPTA) 100-62.5-25 MCG/ACT AEPB    Sig: Inhale 1 puff into the lungs daily.    Dispense:  180 each    Refill:  3    Please fill as a 90 day supply   Fluticasone-Umeclidin-Vilant (TRELEGY ELLIPTA) 100-62.5-25 MCG/ACT AEPB    Sig: Inhale 1 puff into the lungs daily.    Dispense:  180 each    Refill:  3    Please fill as a 90 day supply    Return for F/U with Claiborne Billings RT for CPAP, Fritz Pickerel RT for PFT in 1 year, same day pref, then F/U AA after.   Total time spent:20 Minutes Time spent includes review of chart, medications, test results, and follow up plan with the patient.   [place] Controlled Substance Database was reviewed by me.  This patient was seen by Jonetta Osgood, FNP-C in collaboration with Dr. Clayborn Bigness as a part of collaborative care agreement.   Micaila Ziemba R. Valetta Fuller, MSN,  FNP-C Internal medicine

## 2022-04-20 MED ORDER — FLUTICASONE-UMECLIDIN-VILANT 100-62.5-25 MCG/ACT IN AEPB: 1.0000 | INHALATION_SPRAY | Freq: Every day | RESPIRATORY_TRACT | 3 refills | Status: DC

## 2022-04-22 ENCOUNTER — Other Ambulatory Visit: Payer: Self-pay | Admitting: Urology

## 2022-04-22 ENCOUNTER — Telehealth: Payer: Self-pay | Admitting: Urology

## 2022-04-22 DIAGNOSIS — H903 Sensorineural hearing loss, bilateral: Secondary | ICD-10-CM | POA: Diagnosis not present

## 2022-04-22 DIAGNOSIS — N138 Other obstructive and reflux uropathy: Secondary | ICD-10-CM

## 2022-04-22 DIAGNOSIS — H6063 Unspecified chronic otitis externa, bilateral: Secondary | ICD-10-CM | POA: Diagnosis not present

## 2022-04-22 DIAGNOSIS — H6122 Impacted cerumen, left ear: Secondary | ICD-10-CM | POA: Diagnosis not present

## 2022-04-22 MED ORDER — FINASTERIDE 5 MG PO TABS: 5.0000 mg | ORAL_TABLET | Freq: Every day | ORAL | 3 refills | Status: DC

## 2022-04-22 NOTE — Telephone Encounter (Signed)
Pt. Needs a prescription sent in to [facility] on [address] for his    finasteride (PROSCAR) 5 MG tablet .    He wants to change his pharmacy from [facility] to [facility] Please and Thank you

## 2022-04-26 NOTE — Procedures (Signed)
[facility] [address] [place], [address]    Complete Pulmonary Function Testing Interpretation:  FINDINGS:  Forced vital capacity is mildly decreased.  The FEV1 is 1.6 L which is 56% of predicted and is moderately decreased.  F1 FVC ratio is decreased.  Total lung capacity is decreased.  FRC is decreased.  Residual volume total capacity ratio is increased.  DLCO is within normal limits.  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

## 2022-05-13 LAB — PULMONARY FUNCTION TEST

## 2022-05-24 ENCOUNTER — Other Ambulatory Visit: Payer: Self-pay | Admitting: Family Medicine

## 2022-05-24 DIAGNOSIS — J4550 Severe persistent asthma, uncomplicated: Secondary | ICD-10-CM

## 2022-05-26 NOTE — Telephone Encounter (Signed)
Requested Prescriptions  Pending Prescriptions Disp Refills   albuterol (VENTOLIN HFA) 108 (90 Base) MCG/ACT inhaler [Pharmacy Med Name: VENTOLIN HFA INH W/DOS CTR 200PUFFS] 18 g 0    Sig: INHALE 2 PUFFS BY MOUTH EVERY 6 HOURS AS NEEDED FOR SHORTNESS OF BREATH OR WHEEZING     Pulmonology:  Beta Agonists 2 Passed - 05/24/2022  3:30 AM      Passed - Last BP in normal range    BP Readings from Last 1 Encounters:  04/17/22 130/60         Passed - Last Heart Rate in normal range    Pulse Readings from Last 1 Encounters:  04/17/22 73         Passed - Valid encounter within last 12 months    Recent Outpatient Visits           2 months ago Essential (primary) hypertension   [place] [facility] [place], Deanna C, MD   4 months ago Essential (primary) hypertension   [place] [facility] [place], Deanna C, MD   4 months ago 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

## 2022-06-12 ENCOUNTER — Other Ambulatory Visit: Payer: Self-pay | Admitting: Family Medicine

## 2022-06-12 DIAGNOSIS — I10 Essential (primary) hypertension: Secondary | ICD-10-CM

## 2022-06-12 NOTE — Telephone Encounter (Signed)
Requested Prescriptions  Pending Prescriptions Disp Refills   losartan-hydrochlorothiazide (HYZAAR) 100-25 MG tablet [Pharmacy Med Name: LOSARTAN/HCTZ 100/25MG  TABLETS] 90 tablet 1    Sig: TAKE 1 TABLET BY MOUTH EVERY DAY     Cardiovascular: ARB + Diuretic Combos Passed - 06/12/2022  3:30 AM      Passed - K in normal range and within 180 days    Potassium  Date Value Ref Range Status  03/06/2022 4.2 3.5 - 5.2 mmol/L Final         Passed - Na in normal range and within 180 days    Sodium  Date Value Ref Range Status  12/26/2021 138 134 - 144 mmol/L Final         Passed - Cr in normal range and within 180 days    Creatinine, Ser  Date Value Ref Range Status  12/26/2021 0.94 0.76 - 1.27 mg/dL Final         Passed - eGFR is 10 or above and within 180 days    GFR calc Af Amer  Date Value Ref Range Status  02/20/2020 56 (L) >59 mL/min/1.73 Final    Comment:    **In accordance with recommendations from the NKF-ASN Task force,**   [facility] is in the process of updating its eGFR calculation to the   2021 CKD-EPI creatinine equation that estimates kidney function   without a race variable.    GFR calc non Af Amer  Date Value Ref Range Status  02/20/2020 49 (L) >59 mL/min/1.73 Final   eGFR  Date Value Ref Range Status  12/26/2021 82 >59 mL/min/1.73 Final         Passed - Patient is not pregnant      Passed - Last BP in normal range    BP Readings from Last 1 Encounters:  04/17/22 130/60         Passed - Valid encounter within last 6 months    Recent Outpatient Visits           3 months ago Essential (primary) hypertension   [place] [facility] [place], Deanna C, MD   4 months ago Essential (primary) hypertension   [place] [facility] [place], [place], MD   4 months ago 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   [place]  [facility] [place], Earley Abide, MD   5 months ago Essential (primary) hypertension   [place] [facility] [place], [place], MD   9 months ago Familial multiple lipoprotein-type hyperlipidemia   [place] [facility] [place], [place], MD       Future Appointments             In 2 weeks Juline Patch, MD [place] at [facility]   In 3 weeks McGowan, Hunt Oris, PA-C [facility] [place]             amLODipine (NORVASC) 2.5 MG tablet [Pharmacy Med Name: AMLODIPINE BESYLATE 2.5MG  TABLETS] 90 tablet 1    Sig: TAKE 1 TABLET BY MOUTH EVERY DAY     Cardiovascular: Calcium Channel Blockers 2 Passed - 06/12/2022  3:30 AM      Passed - Last BP in normal range    BP Readings from Last 1 Encounters:  04/17/22  130/60         Passed - Last Heart Rate in normal range    Pulse Readings from Last 1 Encounters:  04/17/22 73         Passed - Valid encounter within last 6 months    Recent Outpatient Visits           3 months ago Essential (primary) hypertension   [place] [facility] [place], Deanna C, MD   4 months ago Essential (primary) hypertension   [place] [facility] [place], Deanna C, MD   4 months ago 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

## 2022-06-27 ENCOUNTER — Ambulatory Visit: Payer: Medicare Other | Admitting: Family Medicine

## 2022-06-27 NOTE — Progress Notes (Unsigned)
06/30/22 10:16 AM   [name] [birth year] [id number]  Referring provider:  Duanne Limerick, MD [address] [place],  [place] [address]  Urological history 1. BPH with LU TS -PSA (2019) 0.8 -aged out of screening -Finasteride 5 mg daily  Chief Complaint  Patient presents with   Benign Prostatic Hypertrophy    HPI: Marcus Delacruz is a 82 y.o.male who presents today for an office visit and refill on medications.   I PSS 0/1  He has no complaints at this visit.  He takes finasteride 5 mg daily.  Patient denies any modifying or aggravating factors.  Patient denies any gross hematuria, dysuria or suprapubic/flank pain.  Patient denies any fevers, chills, nausea or vomiting.    IPSS     Row Name 06/30/22 1000         International Prostate Symptom Score   How often have you had the sensation of not emptying your bladder? Not at All     How often have you had to urinate less than every two hours? Not at All     How often have you found you stopped and started again several times when you urinated? Not at All     How often have you found it difficult to postpone urination? Not at All     How often have you had a weak urinary stream? Not at All     How often have you had to strain to start urination? Not at All     How many times did you typically get up at night to urinate? None     Total IPSS Score 0       Quality of Life due to urinary symptoms   If you were to spend the rest of your life with your urinary condition just the way it is now how would you feel about that? Pleased               Score:  1-7 Mild 8-19 Moderate 20-35 Severe     PMH: Past Medical History:  Diagnosis Date   Allergic rhinitis due to pollen    Asthma    BPH (benign prostatic hyperplasia)    Cardiomegaly    Chronic obstructive pulmonary disease (COPD) (HCC)    Hypercholesteremia    Hypersomnia, unspecified    Hypertension    Obstructive sleep apnea (adult)  (pediatric)    Shortness of breath    Snoring    Solitary pulmonary nodule     Surgical History: Past Surgical History:  Procedure Laterality Date   ARTERY REPAIR     BACK SURGERY     CHOLECYSTECTOMY     COLONOSCOPY  2013   cleared   HERNIA REPAIR      Home Medications:  Allergies as of 06/30/2022       Reactions   Codeine Itching        Medication List        Accurate as of June 30, 2022 10:16 AM. If you have any questions, ask your nurse or doctor.          albuterol 108 (90 Base) MCG/ACT inhaler Commonly known as: Ventolin HFA INHALE 2 PUFFS BY MOUTH EVERY 6 HOURS AS NEEDED FOR SHORTNESS OF BREATH OR WHEEZING   amLODipine 2.5 MG tablet Commonly known as: NORVASC TAKE 1 TABLET BY MOUTH EVERY DAY   aspirin 81 MG chewable tablet Chew 81 mg by mouth daily.   ezetimibe 10 MG tablet Commonly  known as: ZETIA TAKE (1) TABLET BY MOUTH EVERY DAY   fexofenadine 180 MG tablet Commonly known as: Allergy Relief TAKE (1) TABLET BY MOUTH EVERY DAY   finasteride 5 MG tablet Commonly known as: PROSCAR Take 1 tablet (5 mg total) by mouth daily.   Fluticasone-Umeclidin-Vilant 100-62.5-25 MCG/ACT Aepb Commonly known as: Trelegy Ellipta Inhale 1 puff into the lungs daily.   ibuprofen 800 MG tablet Commonly known as: ADVIL TAKE (1) TABLET BY MOUTH THREE TIMES A DAY AS NEEDED   ipratropium-albuterol 0.5-2.5 (3) MG/3ML Soln Commonly known as: DUONEB Take 3 mLs by nebulization every 6 (six) hours as needed.   losartan-hydrochlorothiazide 100-25 MG tablet Commonly known as: HYZAAR TAKE 1 TABLET BY MOUTH EVERY DAY   montelukast 10 MG tablet Commonly known as: SINGULAIR Take 1 tablet daily   NON FORMULARY cpap device   rosuvastatin 5 MG tablet Commonly known as: CRESTOR TAKE (1) TABLET BY MOUTH EVERY DAY        Allergies:  Allergies  Allergen Reactions   Codeine Itching    Family History: Family History  Problem Relation Age of Onset   COPD  Mother    Cancer Father        lung   Asthma Son    Prostate cancer Neg Hx    Kidney disease Neg Hx     Social History:  reports that he quit smoking about 38 years ago. His smoking use included cigarettes. He has a 20.00 pack-year smoking history. He has been exposed to tobacco smoke. He has never used smokeless tobacco. He reports current alcohol use. He reports that he does not use drugs.   Physical Exam: Ht  (1.778 m)   Wt 181 lb (82.1 kg)   BMI 25.97 kg/m   Constitutional:  Well nourished. Alert and oriented, No acute distress. HEENT: [place] AT, moist mucus membranes.  Trachea midline Cardiovascular: No clubbing, cyanosis, or edema. Respiratory: Normal respiratory effort, no increased work of breathing. Neurologic: Grossly intact, no focal deficits, moving all 4 extremities. Psychiatric: Normal mood and affect.   Laboratory Data N/A   Pertinent Imaging: N/A  Assessment & Plan:    BPH with LUTS - Continue conservative management, avoiding bladder irritants and timed voiding's - He will continue finasteride 5 mg daily  Return in about 1 year (around 06/30/2023) for 44yr f/u w/ IPSS .  Cloretta Ned   [facility] [address], [address] [place], [place] [address] [phone]

## 2022-06-30 ENCOUNTER — Ambulatory Visit: Payer: Medicare Other | Admitting: Urology

## 2022-06-30 ENCOUNTER — Encounter: Payer: Self-pay | Admitting: Urology

## 2022-06-30 ENCOUNTER — Encounter: Payer: Self-pay | Admitting: Family Medicine

## 2022-06-30 ENCOUNTER — Ambulatory Visit
Admission: RE | Admit: 2022-06-30 | Discharge: 2022-06-30 | Disposition: A | Discharge status: Home / Self Care | Payer: Medicare Other | Source: Ambulatory Visit | Attending: Family Medicine | Admitting: Family Medicine

## 2022-06-30 ENCOUNTER — Ambulatory Visit: Payer: Medicare Other | Admitting: Family Medicine

## 2022-06-30 ENCOUNTER — Ambulatory Visit
Admission: RE | Admit: 2022-06-30 | Discharge: 2022-06-30 | Disposition: A | Discharge status: Home / Self Care | Payer: Medicare Other | Attending: Family Medicine | Admitting: Family Medicine

## 2022-06-30 VITALS — BP 120/64 | HR 74 | Ht 70.0 in | Wt 184.0 lb

## 2022-06-30 VITALS — Ht 70.0 in | Wt 181.0 lb

## 2022-06-30 DIAGNOSIS — E7849 Other hyperlipidemia: Secondary | ICD-10-CM | POA: Diagnosis not present

## 2022-06-30 DIAGNOSIS — M542 Cervicalgia: Secondary | ICD-10-CM

## 2022-06-30 DIAGNOSIS — N401 Enlarged prostate with lower urinary tract symptoms: Secondary | ICD-10-CM | POA: Diagnosis not present

## 2022-06-30 DIAGNOSIS — J301 Allergic rhinitis due to pollen: Secondary | ICD-10-CM

## 2022-06-30 DIAGNOSIS — J4550 Severe persistent asthma, uncomplicated: Secondary | ICD-10-CM

## 2022-06-30 DIAGNOSIS — N138 Other obstructive and reflux uropathy: Secondary | ICD-10-CM

## 2022-06-30 DIAGNOSIS — I10 Essential (primary) hypertension: Secondary | ICD-10-CM

## 2022-06-30 MED ORDER — LOSARTAN POTASSIUM-HCTZ 100-25 MG PO TABS
1.0000 | ORAL_TABLET | Freq: Every day | ORAL | 1 refills | Status: DC
Start: 2022-06-30 — End: 2022-12-29

## 2022-06-30 MED ORDER — ROSUVASTATIN CALCIUM 5 MG PO TABS: ORAL_TABLET | ORAL | 1 refills | Status: DC

## 2022-06-30 MED ORDER — EZETIMIBE 10 MG PO TABS: ORAL_TABLET | ORAL | 1 refills | Status: DC

## 2022-06-30 MED ORDER — ALBUTEROL SULFATE HFA 108 (90 BASE) MCG/ACT IN AERS: INHALATION_SPRAY | RESPIRATORY_TRACT | 1 refills | Status: DC

## 2022-06-30 MED ORDER — FEXOFENADINE HCL 180 MG PO TABS: ORAL_TABLET | ORAL | 1 refills | Status: DC

## 2022-06-30 MED ORDER — AMLODIPINE BESYLATE 2.5 MG PO TABS: ORAL_TABLET | ORAL | 1 refills | Status: DC

## 2022-06-30 MED ORDER — MONTELUKAST SODIUM 10 MG PO TABS: ORAL_TABLET | ORAL | 1 refills | Status: DC

## 2022-06-30 NOTE — Progress Notes (Signed)
Date:  06/30/2022   Name:  DEKENDRICK UZELAC   DOB:  [birth year]   MRN:  [id number]   Chief Complaint: Asthma, Hypertension, Hyperlipidemia, and Allergic Rhinitis   Asthma He complains of wheezing. There is no chest tightness, cough, difficulty breathing, frequent throat clearing, hemoptysis, hoarse voice, shortness of breath or sputum production. This is a chronic problem. The current episode started more than 1 year ago. The problem occurs intermittently. The problem has been gradually improving. Pertinent negatives include no appetite change, chest pain, dyspnea on exertion, ear congestion, ear pain, fever, headaches, heartburn, malaise/fatigue, myalgias, nasal congestion, orthopnea, PND, postnasal drip, rhinorrhea, sneezing, sore throat, sweats, trouble swallowing or weight loss. His symptoms are alleviated by beta-agonist, ipratropium and steroid inhaler. He reports moderate improvement on treatment. His past medical history is significant for asthma.  Hypertension This is a chronic problem. The current episode started more than 1 year ago. The problem has been gradually improving since onset. The problem is controlled. Associated symptoms include neck pain. Pertinent negatives include no anxiety, chest pain, headaches, malaise/fatigue, orthopnea, palpitations, PND, shortness of breath or sweats. There are no associated agents to hypertension. There are no known risk factors for coronary artery disease. Past treatments include angiotensin blockers, diuretics and calcium channel blockers. The current treatment provides moderate improvement. There are no compliance problems.  There is no history of angina, kidney disease, CAD/MI, CVA, heart failure, left ventricular hypertrophy, PVD or retinopathy. There is no history of chronic renal disease, a hypertension causing med or renovascular disease.  Hyperlipidemia This is a chronic problem. The current episode started more than 1 year ago. The  problem is controlled. Recent lipid tests were reviewed and are normal. He has no history of chronic renal disease, diabetes or hypothyroidism. Factors aggravating his hyperlipidemia include thiazides. Pertinent negatives include no chest pain, myalgias or shortness of breath. Current antihyperlipidemic treatment includes statins and diet change. The current treatment provides moderate improvement of lipids. There are no compliance problems.   Neck Pain  This is a new problem. The current episode started more than 1 month ago (5 weeks). The problem occurs intermittently. The problem has been gradually improving. The pain is associated with nothing. The pain is present in the left side. The quality of the pain is described as aching. The pain is mild. The symptoms are aggravated by position and twisting. Pertinent negatives include no chest pain, fever, headaches, numbness, trouble swallowing, weakness or weight loss. He has tried NSAIDs and acetaminophen (wife wrapped ace around neck) for the symptoms.    Lab Results  Component Value Date   NA 138 12/26/2021   K 4.2 03/06/2022   CO2 26 12/26/2021   GLUCOSE 91 12/26/2021   BUN 14 12/26/2021   CREATININE 0.94 12/26/2021   CALCIUM 9.8 12/26/2021   EGFR 82 12/26/2021   GFRNONAA 49 (L) 02/20/2020   Lab Results  Component Value Date   CHOL 181 08/22/2021   HDL 62 08/22/2021   LDLCALC 101 (H) 08/22/2021   TRIG 100 08/22/2021   CHOLHDL 3.5 08/20/2017   No results found for: "TSH" No results found for: "HGBA1C" Lab Results  Component Value Date   WBC 10.8 12/20/2020   HGB 15.6 12/20/2020   HCT 46.5 12/20/2020   MCV 88 12/20/2020   PLT 312 12/20/2020   Lab Results  Component Value Date   ALT 29 08/22/2021   AST 50 (H) 12/26/2021   ALKPHOS 72 08/22/2021   BILITOT 0.8 08/22/2021  No results found for: "25OHVITD2", "25OHVITD3", "VD25OH"   Review of Systems  Constitutional:  Negative for appetite change, fever, malaise/fatigue and  weight loss.  HENT:  Negative for ear pain, hoarse voice, mouth sores, postnasal drip, rhinorrhea, sneezing, sore throat and trouble swallowing.   Respiratory:  Positive for wheezing. Negative for cough, hemoptysis, sputum production, chest tightness and shortness of breath.   Cardiovascular:  Negative for chest pain, dyspnea on exertion, palpitations, orthopnea, leg swelling and PND.  Gastrointestinal:  Negative for heartburn.  Musculoskeletal:  Positive for neck pain. Negative for back pain, gait problem and myalgias.  Neurological:  Negative for weakness, numbness and headaches.    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 01/21/2022   Chronic obstructive pulmonary disease with acute exacerbation 07/27/2017   Asthma, chronic, severe persistent, uncomplicated 08/18/2016   Seasonal allergic rhinitis due to pollen 08/18/2016   Bilateral carotid artery stenosis 04/03/2016   Bilateral hydrocele 11/22/2014   BPH with obstruction/lower urinary tract symptoms 10/05/2014   Epididymal cyst 10/05/2014   Familial multiple lipoprotein-type hyperlipidemia 07/14/2014   Laboratory animal allergy 07/14/2014   Acute arthropathy 07/14/2014   Routine general medical examination at a health care facility 07/14/2014   Asthma with exacerbation 07/14/2014   Essential (primary) hypertension 07/14/2014   Screening for depression 07/14/2014   Noncompliance w/medication treatment due to intermit use of medication 11/23/2013   Asthma, chronic 08/08/2013    Allergies  Allergen Reactions   Codeine Itching    Past Surgical History:  Procedure Laterality Date   ARTERY REPAIR     BACK SURGERY     CHOLECYSTECTOMY     COLONOSCOPY  2013   cleared   HERNIA REPAIR      Social History   Tobacco Use   Smoking status: Former    Packs/day: 1.00    Years: 20.00    Additional pack years: 0.00    Total pack years: 20.00     Types: Cigarettes    Quit date: 08/09/1983    Years since quitting: 38.9    Passive exposure: Past   Smokeless tobacco: Never   Tobacco comments:    quit smoking over 40 years ago  Vaping Use   Vaping Use: Never used  Substance Use Topics   Alcohol use: Yes    Alcohol/week: 0.0 standard drinks of alcohol    Comment: once monthly   Drug use: No     Medication list has been reviewed and updated.  Current Meds  Medication Sig   albuterol (VENTOLIN HFA) 108 (90 Base) MCG/ACT inhaler INHALE 2 PUFFS BY MOUTH EVERY 6 HOURS AS NEEDED FOR SHORTNESS OF BREATH OR WHEEZING   amLODipine (NORVASC) 2.5 MG tablet TAKE 1 TABLET BY MOUTH EVERY DAY   aspirin 81 MG chewable tablet Chew 81 mg by mouth daily.   ezetimibe (ZETIA) 10 MG tablet TAKE (1) TABLET BY MOUTH EVERY DAY   fexofenadine (ALLERGY RELIEF) 180 MG tablet TAKE (1) TABLET BY MOUTH EVERY DAY   finasteride (PROSCAR) 5 MG tablet Take 1 tablet (5 mg total) by mouth daily.   Fluticasone-Umeclidin-Vilant (TRELEGY ELLIPTA) 100-62.5-25 MCG/ACT AEPB Inhale 1 puff into the lungs daily.   ibuprofen (ADVIL) 800 MG tablet TAKE (1) TABLET BY MOUTH THREE TIMES A DAY AS NEEDED   ipratropium-albuterol (DUONEB) 0.5-2.5 (3) MG/3ML SOLN Take 3 mLs by nebulization every 6 (six) hours as needed.   losartan-hydrochlorothiazide (HYZAAR) 100-25 MG tablet TAKE  1 TABLET BY MOUTH EVERY DAY   montelukast (SINGULAIR) 10 MG tablet Take 1 tablet daily   NON FORMULARY cpap device   rosuvastatin (CRESTOR) 5 MG tablet TAKE (1) TABLET BY MOUTH EVERY DAY       06/30/2022    1:23 PM 12/26/2021   10:45 AM 07/02/2021   11:03 AM 06/26/2021   11:12 AM  GAD 7 : Generalized Anxiety Score  Nervous, Anxious, on Edge 0 0 0 0  Control/stop worrying 0 0 0 0  Worry too much - different things 0 0 0 0  Trouble relaxing 0 0 0 0  Restless 0 0 0 0  Easily annoyed or irritable 0 0 0 0  Afraid - awful might happen 0 0 0 0  Total GAD 7 Score 0 0 0 0  Anxiety Difficulty Not  difficult at all Not difficult at all Not difficult at all Not difficult at all       06/30/2022    1:23 PM 03/17/2022    3:38 PM 12/26/2021   10:44 AM  Depression screen PHQ 2/9  Decreased Interest 0 0 0  Down, Depressed, Hopeless 0 0 0  PHQ - 2 Score 0 0 0  Altered sleeping 0  0  Tired, decreased energy 0  0  Change in appetite 0  0  Feeling bad or failure about yourself  0  0  Trouble concentrating 0  0  Moving slowly or fidgety/restless 0  0  Suicidal thoughts 0  0  PHQ-9 Score 0  0  Difficult doing work/chores Not difficult at all  Not difficult at all    BP Readings from Last 3 Encounters:  06/30/22 120/64  04/17/22 130/60  03/26/22 (!) 154/77    Physical Exam  Wt Readings from Last 3 Encounters:  06/30/22 184 lb (83.5 kg)  06/30/22 181 lb (82.1 kg)  04/17/22 183 lb 9.6 oz (83.3 kg)    BP 120/64   Pulse 74   Ht 5\' 10"  (1.778 m)   Wt 184 lb (83.5 kg)   SpO2 94%   BMI 26.40 kg/m   Assessment and Plan: 1. Essential (primary) hypertension Chronic.  Controlled.  Stable.  Blood pressure 120/64.  Continue amlodipine 2.5 mg once a day and also hydrochlorothiazide 100-25 mg once a day.  Will check CMP for electrolytes and GFR.  Will recheck patient in 6 months. - amLODipine (NORVASC) 2.5 MG tablet; TAKE 1 TABLET BY MOUTH EVERY DAY  Dispense: 90 tablet; Refill: 1 - losartan-hydrochlorothiazide (HYZAAR) 100-25 MG tablet; Take 1 tablet by mouth daily.  Dispense: 90 tablet; Refill: 1 - Comprehensive Metabolic Panel (CMET)  2. Asthma, chronic, severe persistent, uncomplicated Chronic.  Controlled.  Stable.  Followed by pulmonary.  Patient is currently on Trelegy and I refill his albuterol which have encouraged him to use on a daily basis rather than waiting for acute exacerbations.  Patient is also to continue Singulair 10 mg once a day. - albuterol (VENTOLIN HFA) 108 (90 Base) MCG/ACT inhaler; INHALE 2 PUFFS BY MOUTH EVERY 6 HOURS AS NEEDED FOR SHORTNESS OF BREATH OR  WHEEZING  Dispense: 18 g; Refill: 1 - montelukast (SINGULAIR) 10 MG tablet; Take 1 tablet daily  Dispense: 90 tablet; Refill: 1  3. Familial multiple lipoprotein-type hyperlipidemia Chronic.  Controlled.  Stable.  Continue Zetia 10 mg and rosuvastatin 5 mg once a day.  Will check lipid panel current status of LDL control. - ezetimibe (ZETIA) 10 MG tablet; TAKE (1) TABLET BY MOUTH EVERY  DAY  Dispense: 90 tablet; Refill: 1 - rosuvastatin (CRESTOR) 5 MG tablet; TAKE (1) TABLET BY MOUTH EVERY DAY  Dispense: 90 tablet; Refill: 1 - Lipid Panel With LDL/HDL Ratio  4. Seasonal allergic rhinitis due to pollen Chronic.  Controlled.  Stable.  Continue fexofenadine 180 mg daily with Singulair 10 mg once a day. - fexofenadine (ALLERGY RELIEF) 180 MG tablet; TAKE (1) TABLET BY MOUTH EVERY DAY  Dispense: 90 tablet; Refill: 1 - montelukast (SINGULAIR) 10 MG tablet; Take 1 tablet daily  Dispense: 90 tablet; Refill: 1  5. Neck pain New onset.  Persistent over a year with pain that radiates to the left shoulder.  Exacerbated by rotation and patient is unable to twist neck to 1 side to the other.  We will proceed with x-ray.  In the meantime patient has been encouraged to take ibuprofen 800 mg as prescribed by sports medicine. - DG Cervical Spine Complete; Normal    Elizabeth Sauer, MD

## 2022-07-01 LAB — COMPREHENSIVE METABOLIC PANEL
ALT: 24 IU/L (ref 0–44)
AST: 57 IU/L — ABNORMAL HIGH (ref 0–40)
Albumin/Globulin Ratio: 1.8 (ref 1.2–2.2)
Albumin: 4.3 g/dL (ref 3.8–4.8)
Alkaline Phosphatase: 68 IU/L (ref 44–121)
BUN/Creatinine Ratio: 19 (ref 10–24)
BUN: 18 mg/dL (ref 8–27)
Bilirubin Total: 0.4 mg/dL (ref 0.0–1.2)
CO2: 24 mmol/L (ref 20–29)
Calcium: 9.6 mg/dL (ref 8.6–10.2)
Chloride: 102 mmol/L (ref 96–106)
Creatinine, Ser: 0.97 mg/dL (ref 0.76–1.27)
Globulin, Total: 2.4 g/dL (ref 1.5–4.5)
Glucose: 98 mg/dL (ref 70–99)
Potassium: 3.9 mmol/L (ref 3.5–5.2)
Sodium: 141 mmol/L (ref 134–144)
Total Protein: 6.7 g/dL (ref 6.0–8.5)
eGFR: 79 mL/min/{1.73_m2} (ref >59)

## 2022-07-01 LAB — LIPID PANEL WITH LDL/HDL RATIO
Cholesterol, Total: 198 mg/dL (ref 100–199)
HDL: 56 mg/dL (ref >39)
LDL Chol Calc (NIH): 121 mg/dL — ABNORMAL HIGH (ref 0–99)
LDL/HDL Ratio: 2.2 ratio (ref 0.0–3.6)
Triglycerides: 120 mg/dL (ref 0–149)
VLDL Cholesterol Cal: 21 mg/dL (ref 5–40)

## 2022-07-03 ENCOUNTER — Other Ambulatory Visit: Payer: Self-pay | Admitting: Family Medicine

## 2022-07-03 DIAGNOSIS — G8929 Other chronic pain: Secondary | ICD-10-CM

## 2022-07-03 NOTE — Telephone Encounter (Signed)
Requested Prescriptions  Pending Prescriptions Disp Refills   ibuprofen (ADVIL) 800 MG tablet [Pharmacy Med Name: IBUPROFEN  TABLETS] 270 tablet 0    Sig: TAKE 1 TABLET BY MOUTH THREE TIMES DAILY AS NEEDED     Analgesics:  NSAIDS Failed - 07/03/2022  3:30 AM      Failed - Manual Review: Labs are only required if the patient has taken medication for more than 8 weeks.      Failed - HGB in normal range and within 360 days    Hemoglobin  Date Value Ref Range Status  12/20/2020 15.6 13.0 - 17.7 g/dL Final         Failed - PLT in normal range and within 360 days    Platelets  Date Value Ref Range Status  12/20/2020 312 150 - 450 x10E3/uL Final         Failed - HCT in normal range and within 360 days    Hematocrit  Date Value Ref Range Status  12/20/2020 46.5 37.5 - 51.0 % Final         Passed - Cr in normal range and within 360 days    Creatinine, Ser  Date Value Ref Range Status  06/30/2022 0.97 0.76 - 1.27 mg/dL Final         Passed - eGFR is 30 or above and within 360 days    GFR calc Af Amer  Date Value Ref Range Status  02/20/2020 56 (L) >59 mL/min/1.73 Final    Comment:    **In accordance with recommendations from the NKF-ASN Task force,**   [facility] is in the process of updating its eGFR calculation to the   2021 CKD-EPI creatinine equation that estimates kidney function   without a race variable.    GFR calc non Af Amer  Date Value Ref Range Status  02/20/2020 49 (L) >59 mL/min/1.73 Final   eGFR  Date Value Ref Range Status  06/30/2022 79 >59 mL/min/1.73 Final         Passed - Patient is not pregnant      Passed - Valid encounter within last 12 months    Recent Outpatient Visits           3 days ago Essential (primary) hypertension   [facility], [facility]   3 months ago Essential (primary) hypertension   [facility] [facility], MD    5 months ago Essential (primary) hypertension   [place] [facility], [facility]   5 months ago 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

## 2022-07-07 ENCOUNTER — Ambulatory Visit: Payer: Medicare Other | Admitting: Urology

## 2022-07-15 ENCOUNTER — Telehealth: Payer: Self-pay | Admitting: Nurse Practitioner

## 2022-07-15 ENCOUNTER — Ambulatory Visit: Payer: Medicare Other | Admitting: Family Medicine

## 2022-07-15 ENCOUNTER — Telehealth: Payer: Self-pay | Admitting: Family Medicine

## 2022-07-15 ENCOUNTER — Ambulatory Visit (INDEPENDENT_AMBULATORY_CARE_PROVIDER_SITE_OTHER): Payer: Medicare Other | Admitting: Nurse Practitioner

## 2022-07-15 ENCOUNTER — Encounter: Payer: Self-pay | Admitting: Family Medicine

## 2022-07-15 ENCOUNTER — Telehealth: Payer: Self-pay

## 2022-07-15 ENCOUNTER — Encounter: Payer: Self-pay | Admitting: Nurse Practitioner

## 2022-07-15 VITALS — BP 140/70 | HR 80 | Temp 97.7°F | Resp 16 | Ht 70.0 in | Wt 180.6 lb

## 2022-07-15 DIAGNOSIS — J449 Chronic obstructive pulmonary disease, unspecified: Secondary | ICD-10-CM | POA: Diagnosis not present

## 2022-07-15 DIAGNOSIS — R0602 Shortness of breath: Secondary | ICD-10-CM

## 2022-07-15 DIAGNOSIS — J441 Chronic obstructive pulmonary disease with (acute) exacerbation: Secondary | ICD-10-CM | POA: Diagnosis not present

## 2022-07-15 DIAGNOSIS — J4520 Mild intermittent asthma, uncomplicated: Secondary | ICD-10-CM | POA: Diagnosis not present

## 2022-07-15 DIAGNOSIS — G4733 Obstructive sleep apnea (adult) (pediatric): Secondary | ICD-10-CM | POA: Diagnosis not present

## 2022-07-15 MED ORDER — AIRSUPRA 90-80 MCG/ACT IN AERO
1.0000 | INHALATION_SPRAY | Freq: Four times a day (QID) | RESPIRATORY_TRACT | 5 refills | Status: DC | PRN
Start: 2022-07-15 — End: 2023-04-20

## 2022-07-15 MED ORDER — IPRATROPIUM-ALBUTEROL 0.5-2.5 (3) MG/3ML IN SOLN
3.0000 mL | Freq: Four times a day (QID) | RESPIRATORY_TRACT | 1 refills | Status: AC | PRN
Start: 2022-07-15 — End: ?

## 2022-07-15 MED ORDER — PREDNISONE 10 MG PO TABS
20.0000 mg | ORAL_TABLET | Freq: Every day | ORAL | 0 refills | Status: DC
Start: 2022-07-15 — End: 2022-07-29

## 2022-07-15 NOTE — Telephone Encounter (Signed)
S/w pt's doctors office regarding pt's symptoms he was having SOB and COPD and they wanted to schedule an appt with AA, schedule he for 07/17/22 @ 1:20 per Dr Yetta Barre he is stabled

## 2022-07-15 NOTE — Progress Notes (Signed)
[facility] [address] [place], [place] [address]  Internal MEDICINE  Office Visit Note  Patient Name: Marcus Delacruz  [id number]  [id number]  Date of Service: 07/15/2022  Chief Complaint  Patient presents with   Acute Visit   COPD   Shortness of Breath     HPI Tilak presents for an acute sick visit for SOB and COPD Exacerbation -- went to PCP -- received neb tx, and prednisone     Current Medication:  Outpatient Encounter Medications as of 07/15/2022  Medication Sig   albuterol (VENTOLIN HFA) 108 (90 Base) MCG/ACT inhaler INHALE 2 PUFFS BY MOUTH EVERY 6 HOURS AS NEEDED FOR SHORTNESS OF BREATH OR WHEEZING   Albuterol-Budesonide (AIRSUPRA) 90-80 MCG/ACT AERO Inhale 1 puff into the lungs every 6 (six) hours as needed (SOB, wheezing).   amLODipine (NORVASC) 2.5 MG tablet TAKE 1 TABLET BY MOUTH EVERY DAY   aspirin 81 MG chewable tablet Chew 81 mg by mouth daily.   ezetimibe (ZETIA) 10 MG tablet TAKE (1) TABLET BY MOUTH EVERY DAY   fexofenadine (ALLERGY RELIEF) 180 MG tablet TAKE (1) TABLET BY MOUTH EVERY DAY   finasteride (PROSCAR) 5 MG tablet Take 1 tablet (5 mg total) by mouth daily.   Fluticasone-Umeclidin-Vilant (TRELEGY ELLIPTA) 100-62.5-25 MCG/ACT AEPB Inhale 1 puff into the lungs daily.   ibuprofen (ADVIL) 800 MG tablet TAKE 1 TABLET BY MOUTH THREE TIMES DAILY AS NEEDED   ipratropium-albuterol (DUONEB) 0.5-2.5 (3) MG/3ML SOLN Take 3 mLs by nebulization every 6 (six) hours as needed.   losartan-hydrochlorothiazide (HYZAAR) 100-25 MG tablet Take 1 tablet by mouth daily.   montelukast (SINGULAIR) 10 MG tablet Take 1 tablet daily   NON FORMULARY cpap device   predniSONE (DELTASONE) 10 MG tablet Take 2 tablets (20 mg total) by mouth daily with breakfast.   rosuvastatin (CRESTOR) 5 MG tablet TAKE (1) TABLET BY MOUTH EVERY DAY   No facility-administered encounter medications on file as of 07/15/2022.      Medical History: Past Medical History:   Diagnosis Date   Allergic rhinitis due to pollen    Asthma    BPH (benign prostatic hyperplasia)    Cardiomegaly    Chronic obstructive pulmonary disease (COPD) (HCC)    Hypercholesteremia    Hypersomnia, unspecified    Hypertension    Obstructive sleep apnea (adult) (pediatric)    Shortness of breath    Snoring    Solitary pulmonary nodule      Vital Signs: BP (!) 140/70   Pulse 80   Temp 97.7 F (36.5 C)   Resp 16   Ht 5\' 10"  (1.778 m)   Wt 180 lb 9.6 oz (81.9 kg)   SpO2 92%   BMI 25.91 kg/m    Review of Systems  Constitutional:  Positive for fatigue. Negative for chills and unexpected weight change.  HENT:  Negative for congestion, postnasal drip, rhinorrhea, sneezing and sore throat.   Eyes:  Negative for redness.  Respiratory:  Positive for cough, chest tightness, shortness of breath and wheezing.   Cardiovascular: Negative.  Negative for chest pain and palpitations.  Gastrointestinal:  Negative for abdominal pain, constipation, diarrhea, nausea and vomiting.  Genitourinary:  Negative for dysuria and frequency.  Musculoskeletal:  Negative for arthralgias, back pain, joint swelling and neck pain.  Skin:  Negative for rash.  Neurological: Negative.  Negative for tremors and numbness.  Hematological:  Negative for adenopathy. Does not bruise/bleed easily.  Psychiatric/Behavioral:  Negative for behavioral problems (Depression), sleep disturbance  and suicidal ideas. The patient is not nervous/anxious.     Physical Exam Constitutional:      General: He is not in acute distress.    Appearance: He is well-developed. He is not ill-appearing.  HENT:     Head: Normocephalic and atraumatic.  Eyes:     Pupils: Pupils are equal, round, and reactive to light.  Cardiovascular:     Rate and Rhythm: Normal rate and regular rhythm.  Pulmonary:     Effort: Pulmonary effort is normal. No respiratory distress.     Breath sounds: Examination of the right-upper field reveals  decreased breath sounds. Examination of the left-upper field reveals decreased breath sounds. Examination of the right-middle field reveals wheezing. Examination of the left-middle field reveals wheezing. Examination of the right-lower field reveals wheezing. Examination of the left-lower field reveals wheezing. Decreased breath sounds and wheezing present.  Chest:     Chest wall: No mass or tenderness.  Skin:    General: Skin is warm and dry.     Capillary Refill: Capillary refill takes less than 2 seconds.  Neurological:     Mental Status: He is alert and oriented to person, place, and time.  Psychiatric:        Mood and Affect: Mood normal.        Behavior: Behavior normal.       Assessment/Plan: 1. Chronic obstructive pulmonary disease, unspecified COPD type (HCC) Airsupra prescribed as requested by patient.  Budesonide Neb treatment ampule given to patient to use at home while having exacerbation.  - Albuterol-Budesonide (AIRSUPRA) 90-80 MCG/ACT AERO; Inhale 1 puff into the lungs every 6 (six) hours as needed (SOB, wheezing).  Dispense: 10.7 g; Refill: 5  2. Shortness of breath Airsupra prescribed Budesonide neb treatments given to patient to use at home while having exacerbations - Albuterol-Budesonide (AIRSUPRA) 90-80 MCG/ACT AERO; Inhale 1 puff into the lungs every 6 (six) hours as needed (SOB, wheezing).  Dispense: 10.7 g; Refill: 5   General Counseling: Decarlo verbalizes understanding of the findings of todays visit and agrees with plan of treatment. I have discussed any further diagnostic evaluation that may be needed or ordered today. We also reviewed his medications today. he has been encouraged to call the office with any questions or concerns that should arise related to todays visit.    Counseling:    No orders of the defined types were placed in this encounter.   Meds ordered this encounter  Medications   Albuterol-Budesonide (AIRSUPRA) 90-80 MCG/ACT AERO     Sig: Inhale 1 puff into the lungs every 6 (six) hours as needed (SOB, wheezing).    Dispense:  10.7 g    Refill:  5    No follow-ups on file.  [place] Controlled Substance Database was reviewed by me for overdose risk score (ORS)  Time spent:20 Minutes Time spent with patient included reviewing progress notes, labs, imaging studies, and discussing plan for follow up.   This patient was seen by Sallyanne Kuster, FNP-C in collaboration with Dr. Beverely Risen as a part of collaborative care agreement.  Kaeden Mester R. Tedd Sias, MSN, FNP-C Internal Medicine

## 2022-07-15 NOTE — Telephone Encounter (Signed)
Spoke with pt wife that we have opening now at 2:20 for today as  per her pt already went to his PCP  for appt today

## 2022-07-15 NOTE — Telephone Encounter (Signed)
error 
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
## 2022-07-17 ENCOUNTER — Ambulatory Visit: Payer: Medicare Other | Admitting: Nurse Practitioner

## 2022-07-26 ENCOUNTER — Encounter: Payer: Self-pay | Admitting: Nurse Practitioner

## 2022-07-29 ENCOUNTER — Ambulatory Visit: Payer: Medicare Other | Admitting: Family Medicine

## 2022-07-29 ENCOUNTER — Encounter: Payer: Self-pay | Admitting: Family Medicine

## 2022-07-29 VITALS — BP 118/68 | HR 78 | Ht 70.0 in | Wt 177.0 lb

## 2022-07-29 DIAGNOSIS — J219 Acute bronchiolitis, unspecified: Secondary | ICD-10-CM | POA: Diagnosis not present

## 2022-07-29 DIAGNOSIS — J4521 Mild intermittent asthma with (acute) exacerbation: Secondary | ICD-10-CM | POA: Diagnosis not present

## 2022-07-29 MED ORDER — PREDNISONE 10 MG PO TABS
10.0000 mg | ORAL_TABLET | Freq: Every day | ORAL | 0 refills | Status: DC
Start: 2022-07-29 — End: 2023-03-09

## 2022-07-29 MED ORDER — IPRATROPIUM-ALBUTEROL 0.5-2.5 (3) MG/3ML IN SOLN
3.0000 mL | Freq: Once | RESPIRATORY_TRACT | Status: DC
Start: 2022-07-29 — End: 2023-04-20

## 2022-07-29 NOTE — Progress Notes (Signed)
Date:  07/29/2022   Name:  Marcus Delacruz   DOB:  1941-01-11   MRN:  [id number]   Chief Complaint: Cough (Congestion- yellow production)  Cough Associated symptoms include rhinorrhea, shortness of breath and wheezing. Pertinent negatives include no chest pain, fever, hemoptysis or postnasal drip. His past medical history is significant for asthma.  Asthma He complains of chest tightness, cough, difficulty breathing, shortness of breath and wheezing. There is no frequent throat clearing, hemoptysis, hoarse voice or sputum production. This is a recurrent problem. The problem occurs intermittently. The problem has been waxing and waning. The cough is productive of purulent sputum (yellow). Associated symptoms include rhinorrhea. Pertinent negatives include no chest pain, fever, PND, postnasal drip or sneezing. His symptoms are aggravated by URI and occupational exposure (helped moved stuff out of basement). His symptoms are alleviated by OTC cough suppressant. His past medical history is significant for asthma.    Lab Results  Component Value Date   NA 141 06/30/2022   K 3.9 06/30/2022   CO2 24 06/30/2022   GLUCOSE 98 06/30/2022   BUN 18 06/30/2022   CREATININE 0.97 06/30/2022   CALCIUM 9.6 06/30/2022   EGFR 79 06/30/2022   GFRNONAA 49 (L) 02/20/2020   Lab Results  Component Value Date   CHOL 198 06/30/2022   HDL 56 06/30/2022   LDLCALC 121 (H) 06/30/2022   TRIG 120 06/30/2022   CHOLHDL 3.5 08/20/2017   No results found for: "TSH" No results found for: "HGBA1C" Lab Results  Component Value Date   WBC 10.8 12/20/2020   HGB 15.6 12/20/2020   HCT 46.5 12/20/2020   MCV 88 12/20/2020   PLT 312 12/20/2020   Lab Results  Component Value Date   ALT 24 06/30/2022   AST 57 (H) 06/30/2022   ALKPHOS 68 06/30/2022   BILITOT 0.4 06/30/2022   No results found for: "25OHVITD2", "25OHVITD3", "VD25OH"   Review of Systems  Constitutional:  Negative for fever.  HENT:  Positive  for hearing loss and rhinorrhea. Negative for hoarse voice, postnasal drip and sneezing.   Respiratory:  Positive for cough, chest tightness, shortness of breath and wheezing. Negative for hemoptysis and sputum production.   Cardiovascular:  Negative for chest pain, palpitations, leg swelling and PND.    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 01/21/2022   Chronic obstructive pulmonary disease with acute exacerbation (HCC) 07/27/2017   Asthma, chronic, severe persistent, uncomplicated 08/18/2016   Seasonal allergic rhinitis due to pollen 08/18/2016   Bilateral carotid artery stenosis 04/03/2016   Bilateral hydrocele 11/22/2014   BPH with obstruction/lower urinary tract symptoms 10/05/2014   Epididymal cyst 10/05/2014   Familial multiple lipoprotein-type hyperlipidemia 07/14/2014   Laboratory animal allergy 07/14/2014   Acute arthropathy 07/14/2014   Routine general medical examination at a health care facility 07/14/2014   Asthma with exacerbation 07/14/2014   Essential (primary) hypertension 07/14/2014   Screening for depression 07/14/2014   Noncompliance w/medication treatment due to intermit use of medication 11/23/2013   Asthma, chronic 08/08/2013    Allergies  Allergen Reactions   Codeine Itching    Past Surgical History:  Procedure Laterality Date   ARTERY REPAIR     BACK SURGERY     CHOLECYSTECTOMY     COLONOSCOPY  2013   cleared   HERNIA REPAIR      Social History   Tobacco Use   Smoking status: Former  Packs/day: 1.00    Years: 20.00    Additional pack years: 0.00    Total pack years: 20.00    Types: Cigarettes    Quit date: 08/09/1983    Years since quitting: 38.9    Passive exposure: Past   Smokeless tobacco: Never   Tobacco comments:    quit smoking over 40 years ago  Vaping Use   Vaping Use: Never used  Substance Use Topics   Alcohol use: Yes     Alcohol/week: 0.0 standard drinks of alcohol    Comment: once monthly   Drug use: No     Medication list has been reviewed and updated.  Current Meds  Medication Sig   albuterol (VENTOLIN HFA) 108 (90 Base) MCG/ACT inhaler INHALE 2 PUFFS BY MOUTH EVERY 6 HOURS AS NEEDED FOR SHORTNESS OF BREATH OR WHEEZING   Albuterol-Budesonide (AIRSUPRA) 90-80 MCG/ACT AERO Inhale 1 puff into the lungs every 6 (six) hours as needed (SOB, wheezing).   amLODipine (NORVASC) 2.5 MG tablet TAKE 1 TABLET BY MOUTH EVERY DAY   aspirin 81 MG chewable tablet Chew 81 mg by mouth daily.   ezetimibe (ZETIA) 10 MG tablet TAKE (1) TABLET BY MOUTH EVERY DAY   fexofenadine (ALLERGY RELIEF) 180 MG tablet TAKE (1) TABLET BY MOUTH EVERY DAY   finasteride (PROSCAR) 5 MG tablet Take 1 tablet (5 mg total) by mouth daily.   Fluticasone-Umeclidin-Vilant (TRELEGY ELLIPTA) 100-62.5-25 MCG/ACT AEPB Inhale 1 puff into the lungs daily.   ibuprofen (ADVIL) 800 MG tablet TAKE 1 TABLET BY MOUTH THREE TIMES DAILY AS NEEDED   ipratropium-albuterol (DUONEB) 0.5-2.5 (3) MG/3ML SOLN Take 3 mLs by nebulization every 6 (six) hours as needed.   losartan-hydrochlorothiazide (HYZAAR) 100-25 MG tablet Take 1 tablet by mouth daily.   montelukast (SINGULAIR) 10 MG tablet Take 1 tablet daily   NON FORMULARY cpap device   rosuvastatin (CRESTOR) 5 MG tablet TAKE (1) TABLET BY MOUTH EVERY DAY       07/29/2022   10:54 AM 06/30/2022    1:23 PM 12/26/2021   10:45 AM 07/02/2021   11:03 AM  GAD 7 : Generalized Anxiety Score  Nervous, Anxious, on Edge 0 0 0 0  Control/stop worrying 0 0 0 0  Worry too much - different things 0 0 0 0  Trouble relaxing 0 0 0 0  Restless 0 0 0 0  Easily annoyed or irritable 0 0 0 0  Afraid - awful might happen 0 0 0 0  Total GAD 7 Score 0 0 0 0  Anxiety Difficulty Not difficult at all Not difficult at all Not difficult at all Not difficult at all       07/29/2022   10:54 AM 06/30/2022    1:23 PM 03/17/2022    3:38 PM   Depression screen PHQ 2/9  Decreased Interest 0 0 0  Down, Depressed, Hopeless 0 0 0  PHQ - 2 Score 0 0 0  Altered sleeping 0 0   Tired, decreased energy 0 0   Change in appetite 0 0   Feeling bad or failure about yourself  0 0   Trouble concentrating 0 0   Moving slowly or fidgety/restless 0 0   Suicidal thoughts 0 0   PHQ-9 Score 0 0   Difficult doing work/chores Not difficult at all Not difficult at all     BP Readings from Last 3 Encounters:  07/29/22 118/68  07/15/22 (!) 140/70  07/15/22 120/70    Physical Exam Vitals and  nursing note reviewed.  HENT:     Right Ear: Tympanic membrane, ear canal and external ear normal. There is no impacted cerumen.     Left Ear: Tympanic membrane, ear canal and external ear normal. There is no impacted cerumen.     Nose: Nose normal. No congestion or rhinorrhea.     Mouth/Throat:     Mouth: Mucous membranes are moist.     Pharynx: No oropharyngeal exudate or posterior oropharyngeal erythema.  Cardiovascular:     Rate and Rhythm: Normal rate and regular rhythm.     Heart sounds: No murmur heard.    No gallop.  Pulmonary:     Effort: No respiratory distress.     Breath sounds: Wheezing present. No rhonchi or rales.  Musculoskeletal:     Cervical back: Neck supple.     Wt Readings from Last 3 Encounters:  07/29/22 177 lb (80.3 kg)  07/15/22 180 lb 9.6 oz (81.9 kg)  07/15/22 184 lb (83.5 kg)    BP 118/68   Pulse 78   Ht 5\' 10"  (1.778 m)   Wt 177 lb (80.3 kg)   SpO2 97%   BMI 25.40 kg/m   Assessment and Plan: 1. Mild intermittent asthma with acute exacerbation New onset of an acute exacerbation over the course of the last several days.  This may be associated with [place]  Examination notes to have an reduced expiration due to air trapping and some wheezing but I think this is dampened from the fact that he is so tight.  Patient was given a duo nebulization with  significant results with the ability to take deep breaths and is able to phonate better.  There is on examination significant improvement with inspiration/expiration with no wheezing noted.  Patient instead of using handheld albuterol we will use his DuoNeb nebulization every 6 hours as well as reinitiate prednisone at 40 mg taper and will recheck on Friday prior to the weekend.  Patient will continue with Mucinex during the day and Mucinex at night to help suppress the cough.  No pneumonia was detected on auscultation and we will hold on antibiotic at this time. - predniSONE (DELTASONE) 10 MG tablet; Take 1 tablet (10 mg total) by mouth daily with breakfast. Taper 4,4,4,3,3,3,2,2,2,1,1,1  Dispense: 30 tablet; Refill: 0 - ipratropium-albuterol (DUONEB) 0.5-2.5 (3) MG/3ML nebulizer solution 3 mL  2. Bronchiolitis As noted above.  We will recheck patient on Friday or sooner as needed. - predniSONE (DELTASONE) 10 MG tablet; Take 1 tablet (10 mg total) by mouth daily with breakfast. Taper 4,4,4,3,3,3,2,2,2,1,1,1  Dispense: 30 tablet; Refill: 0 - ipratropium-albuterol (DUONEB) 0.5-2.5 (3) MG/3ML nebulizer solution 3 mL     Elizabeth Sauer, MD

## 2022-08-01 ENCOUNTER — Encounter: Payer: Self-pay | Admitting: Family Medicine

## 2022-08-01 ENCOUNTER — Ambulatory Visit
Admission: RE | Admit: 2022-08-01 | Discharge: 2022-08-01 | Disposition: A | Discharge status: Home / Self Care | Payer: Medicare Other | Source: Ambulatory Visit | Attending: Family Medicine | Admitting: Family Medicine

## 2022-08-01 ENCOUNTER — Ambulatory Visit: Payer: Medicare Other | Admitting: Family Medicine

## 2022-08-01 ENCOUNTER — Ambulatory Visit
Admission: RE | Admit: 2022-08-01 | Discharge: 2022-08-01 | Disposition: A | Discharge status: Home / Self Care | Payer: Medicare Other | Attending: Family Medicine | Admitting: Family Medicine

## 2022-08-01 VITALS — BP 120/60 | HR 73 | Ht 70.0 in | Wt 173.0 lb

## 2022-08-01 DIAGNOSIS — R0602 Shortness of breath: Secondary | ICD-10-CM | POA: Diagnosis not present

## 2022-08-01 DIAGNOSIS — R059 Cough, unspecified: Secondary | ICD-10-CM | POA: Diagnosis not present

## 2022-08-01 DIAGNOSIS — J4521 Mild intermittent asthma with (acute) exacerbation: Secondary | ICD-10-CM | POA: Insufficient documentation

## 2022-08-01 MED ORDER — FLOVENT DISKUS 50 MCG/ACT IN AEPB
1.0000 | INHALATION_SPRAY | Freq: Two times a day (BID) | RESPIRATORY_TRACT | 0 refills | Status: DC
Start: 2022-08-01 — End: 2023-04-20

## 2022-08-01 NOTE — Progress Notes (Signed)
Date:  08/01/2022   Name:  Marcus Delacruz   DOB:  October 09, 1940   MRN:  [id number]   Chief Complaint: Follow-up (Using neb (duoneb) 4 times daily. And Trellegy inhaler)  Asthma He complains of chest tightness, cough, shortness of breath and wheezing. This is a chronic problem. The current episode started in the past 7 days. The problem occurs daily. The problem has been waxing and waning. The cough is non-productive. Pertinent negatives include no chest pain, fever, nasal congestion, PND or rhinorrhea. His past medical history is significant for asthma.    Lab Results  Component Value Date   NA 141 06/30/2022   K 3.9 06/30/2022   CO2 24 06/30/2022   GLUCOSE 98 06/30/2022   BUN 18 06/30/2022   CREATININE 0.97 06/30/2022   CALCIUM 9.6 06/30/2022   EGFR 79 06/30/2022   GFRNONAA 49 (L) 02/20/2020   Lab Results  Component Value Date   CHOL 198 06/30/2022   HDL 56 06/30/2022   LDLCALC 121 (H) 06/30/2022   TRIG 120 06/30/2022   CHOLHDL 3.5 08/20/2017   No results found for: "TSH" No results found for: "HGBA1C" Lab Results  Component Value Date   WBC 10.8 12/20/2020   HGB 15.6 12/20/2020   HCT 46.5 12/20/2020   MCV 88 12/20/2020   PLT 312 12/20/2020   Lab Results  Component Value Date   ALT 24 06/30/2022   AST 57 (H) 06/30/2022   ALKPHOS 68 06/30/2022   BILITOT 0.4 06/30/2022   No results found for: "25OHVITD2", "25OHVITD3", "VD25OH"   Review of Systems  Constitutional:  Negative for fever.  HENT:  Negative for rhinorrhea.   Respiratory:  Positive for cough, shortness of breath and wheezing. Negative for chest tightness.   Cardiovascular:  Negative for chest pain, palpitations and PND.    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 01/21/2022   Chronic obstructive pulmonary disease with acute exacerbation (HCC) 07/27/2017   Asthma, chronic, severe persistent, uncomplicated  08/18/2016   Seasonal allergic rhinitis due to pollen 08/18/2016   Bilateral carotid artery stenosis 04/03/2016   Bilateral hydrocele 11/22/2014   BPH with obstruction/lower urinary tract symptoms 10/05/2014   Epididymal cyst 10/05/2014   Familial multiple lipoprotein-type hyperlipidemia 07/14/2014   Laboratory animal allergy 07/14/2014   Acute arthropathy 07/14/2014   Routine general medical examination at a health care facility 07/14/2014   Asthma with exacerbation 07/14/2014   Essential (primary) hypertension 07/14/2014   Screening for depression 07/14/2014   Noncompliance w/medication treatment due to intermit use of medication 11/23/2013   Asthma, chronic 08/08/2013    Allergies  Allergen Reactions   Codeine Itching    Past Surgical History:  Procedure Laterality Date   ARTERY REPAIR     BACK SURGERY     CHOLECYSTECTOMY     COLONOSCOPY  2013   cleared   HERNIA REPAIR      Social History   Tobacco Use   Smoking status: Former    Packs/day: 1.00    Years: 20.00    Additional pack years: 0.00    Total pack years: 20.00    Types: Cigarettes    Quit date: 08/09/1983    Years since quitting: 39.0    Passive exposure: Past   Smokeless tobacco: Never   Tobacco comments:    quit smoking over 40 years ago  Vaping Use   Vaping Use: Never used  Substance  Use Topics   Alcohol use: Yes    Alcohol/week: 0.0 standard drinks of alcohol    Comment: once monthly   Drug use: No     Medication list has been reviewed and updated.  Current Meds  Medication Sig   albuterol (VENTOLIN HFA) 108 (90 Base) MCG/ACT inhaler INHALE 2 PUFFS BY MOUTH EVERY 6 HOURS AS NEEDED FOR SHORTNESS OF BREATH OR WHEEZING   amLODipine (NORVASC) 2.5 MG tablet TAKE 1 TABLET BY MOUTH EVERY DAY   aspirin 81 MG chewable tablet Chew 81 mg by mouth daily.   ezetimibe (ZETIA) 10 MG tablet TAKE (1) TABLET BY MOUTH EVERY DAY   fexofenadine (ALLERGY RELIEF) 180 MG tablet TAKE (1) TABLET BY MOUTH EVERY DAY    finasteride (PROSCAR) 5 MG tablet Take 1 tablet (5 mg total) by mouth daily.   Fluticasone Propionate, Inhal, (FLOVENT DISKUS) 50 MCG/ACT AEPB Inhale 1 Inhalation into the lungs every 12 (twelve) hours.   Fluticasone-Umeclidin-Vilant (TRELEGY ELLIPTA) 100-62.5-25 MCG/ACT AEPB Inhale 1 puff into the lungs daily.   ibuprofen (ADVIL) 800 MG tablet TAKE 1 TABLET BY MOUTH THREE TIMES DAILY AS NEEDED   ipratropium-albuterol (DUONEB) 0.5-2.5 (3) MG/3ML SOLN Take 3 mLs by nebulization every 6 (six) hours as needed.   losartan-hydrochlorothiazide (HYZAAR) 100-25 MG tablet Take 1 tablet by mouth daily.   montelukast (SINGULAIR) 10 MG tablet Take 1 tablet daily   NON FORMULARY cpap device   predniSONE (DELTASONE) 10 MG tablet Take 1 tablet (10 mg total) by mouth daily with breakfast. Taper 4,4,4,3,3,3,2,2,2,1,1,1   rosuvastatin (CRESTOR) 5 MG tablet TAKE (1) TABLET BY MOUTH EVERY DAY   Current Facility-Administered Medications for the 08/01/22 encounter (Office Visit) with Duanne Limerick, MD  Medication   ipratropium-albuterol (DUONEB) 0.5-2.5 (3) MG/3ML nebulizer solution 3 mL       08/01/2022    8:35 AM 07/29/2022   10:54 AM 06/30/2022    1:23 PM 12/26/2021   10:45 AM  GAD 7 : Generalized Anxiety Score  Nervous, Anxious, on Edge 0 0 0 0  Control/stop worrying 0 0 0 0  Worry too much - different things 0 0 0 0  Trouble relaxing 0 0 0 0  Restless 0 0 0 0  Easily annoyed or irritable 0 0 0 0  Afraid - awful might happen 0 0 0 0  Total GAD 7 Score 0 0 0 0  Anxiety Difficulty Not difficult at all Not difficult at all Not difficult at all Not difficult at all       08/01/2022    8:35 AM 07/29/2022   10:54 AM 06/30/2022    1:23 PM  Depression screen PHQ 2/9  Decreased Interest 0 0 0  Down, Depressed, Hopeless 0 0 0  PHQ - 2 Score 0 0 0  Altered sleeping 0 0 0  Tired, decreased energy 0 0 0  Change in appetite 0 0 0  Feeling bad or failure about yourself  0 0 0  Trouble concentrating 0 0 0   Moving slowly or fidgety/restless 0 0 0  Suicidal thoughts 0 0 0  PHQ-9 Score 0 0 0  Difficult doing work/chores Not difficult at all Not difficult at all Not difficult at all    BP Readings from Last 3 Encounters:  08/01/22 120/60  07/29/22 118/68  07/15/22 (!) 140/70    Physical Exam Vitals and nursing note reviewed.  HENT:     Head: Normocephalic.     Right Ear: Tympanic membrane and external ear  normal.     Left Ear: Tympanic membrane and external ear normal.     Nose: Nose normal.  Eyes:     General: No scleral icterus.       Right eye: No discharge.        Left eye: No discharge.     Conjunctiva/sclera: Conjunctivae normal.     Pupils: Pupils are equal, round, and reactive to light.  Neck:     Thyroid: No thyromegaly.     Vascular: No JVD.     Trachea: No tracheal deviation.  Cardiovascular:     Rate and Rhythm: Normal rate and regular rhythm.     Heart sounds: Normal heart sounds, S1 normal and S2 normal. No murmur heard.    No systolic murmur is present.     No diastolic murmur is present.     No friction rub. No gallop. No S3 or S4 sounds.  Pulmonary:     Effort: No respiratory distress.     Breath sounds: Decreased air movement present. Wheezing present. No decreased breath sounds, rhonchi or rales.  Abdominal:     General: Bowel sounds are normal.     Palpations: Abdomen is soft. There is no mass.     Tenderness: There is no abdominal tenderness. There is no guarding or rebound.  Musculoskeletal:        General: No tenderness. Normal range of motion.     Cervical back: Normal range of motion and neck supple.     Right lower leg: No edema.     Left lower leg: No edema.  Lymphadenopathy:     Cervical: No cervical adenopathy.  Skin:    General: Skin is warm.     Findings: No rash.  Neurological:     Mental Status: He is alert and oriented to person, place, and time.     Cranial Nerves: No cranial nerve deficit.     Deep Tendon Reflexes: Reflexes are  normal and symmetric.     Wt Readings from Last 3 Encounters:  08/01/22 173 lb (78.5 kg)  07/29/22 177 lb (80.3 kg)  07/15/22 180 lb 9.6 oz (81.9 kg)    BP 120/60   Pulse 73   Ht 5\' 10"  (1.778 m)   Wt 173 lb (78.5 kg)   SpO2 94%   BMI 24.82 kg/m   Assessment and Plan:  1. Mild intermittent asthma with acute exacerbation Chronic.  Episodic.  Currently active.  Patient is on maximum therapy including prednisone taper, DuoNeb therapy, Trelegy, and Mucinex.  Patient was unable to secure insurance coverage for air supra so we will add some Flovent to therapy every 12 hours.  We have arranged for him to be rechecked by pulmonary next Tuesday.  In the meantime patient is to avoid asthma inducing circumstances such as mowing and working in basements.  We will obtain a chest x-ray and that I do not see when that is done in the recent past. - Fluticasone Propionate, Inhal, (FLOVENT DISKUS) 50 MCG/ACT AEPB; Inhale 1 Inhalation into the lungs every 12 (twelve) hours.  Dispense: 60 each; Refill: 0 - DG Chest 2 View    Elizabeth Sauer, MD

## 2022-08-05 ENCOUNTER — Ambulatory Visit: Payer: Medicare Other | Admitting: Internal Medicine

## 2022-08-05 ENCOUNTER — Encounter: Payer: Self-pay | Admitting: Internal Medicine

## 2022-08-05 VITALS — BP 110/60 | HR 83 | Temp 97.6°F | Resp 16 | Ht 70.0 in | Wt 174.0 lb

## 2022-08-05 DIAGNOSIS — J449 Chronic obstructive pulmonary disease, unspecified: Secondary | ICD-10-CM | POA: Diagnosis not present

## 2022-08-05 DIAGNOSIS — R053 Chronic cough: Secondary | ICD-10-CM

## 2022-08-05 DIAGNOSIS — G4733 Obstructive sleep apnea (adult) (pediatric): Secondary | ICD-10-CM

## 2022-08-05 MED ORDER — LEVOFLOXACIN 750 MG PO TABS
750.0000 mg | ORAL_TABLET | Freq: Every day | ORAL | 0 refills | Status: DC
Start: 2022-08-05 — End: 2023-04-20

## 2022-08-05 NOTE — Patient Instructions (Signed)
Chronic Obstructive Pulmonary Disease  Chronic obstructive pulmonary disease (COPD) is a long-term (chronic) lung problem. When you have COPD, it is hard for air to get in and out of your lungs. Usually the condition gets worse over time, and your lungs will never return to normal. There are things you can do to keep yourself as healthy as possible. What are the causes? Smoking. This is the most common cause. Certain genes passed from parent to child (inherited). What increases the risk? Being exposed to secondhand smoke from cigarettes, pipes, or cigars. Being exposed to chemicals and other irritants, such as fumes and dust in the work environment. Having chronic lung conditions or infections. What are the signs or symptoms? Shortness of breath, especially during physical activity. A long-term cough with a large amount of thick mucus. Sometimes, the cough may not have any mucus (dry cough). Wheezing. Breathing quickly. Skin that looks gray or blue, especially in the fingers, toes, or lips. Feeling tired (fatigue). Weight loss. Chest tightness. Having infections often. Episodes when breathing symptoms become much worse (exacerbations). At the later stages of this disease, you may have swelling in the ankles, feet, or legs. How is this treated? Taking medicines. Quitting smoking, if you smoke. Rehabilitation. This includes steps to make your body work better. It may involve a team of specialists. Doing exercises. Making changes to your diet. Using oxygen. Lung surgery. Lung transplant. Comfort measures (palliative care). Follow these instructions at home: Medicines Take over-the-counter and prescription medicines only as told by your doctor. Talk to your doctor before taking any cough or allergy medicines. You may need to avoid medicines that cause your lungs to be dry. Lifestyle If you smoke, stop smoking. Smoking makes the problem worse. Do not smoke or use any products that  contain nicotine or tobacco. If you need help quitting, ask your doctor. Avoid being around things that make your breathing worse. This may include smoke, chemicals, and fumes. Stay active, but remember to rest as well. Learn and use tips on how to manage stress and control your breathing. Make sure you get enough sleep. Most adults need at least 7 hours of sleep every night. Eat healthy foods. Eat smaller meals more often. Rest before meals. Controlled breathing Learn and use tips on how to control your breathing as told by your doctor. Try: Breathing in (inhaling) through your nose for 1 second. Then, pucker your lips and breath out (exhale) through your lips for 2 seconds. Putting one hand on your belly (abdomen). Breathe in slowly through your nose for 1 second. Your hand on your belly should move out. Pucker your lips and breathe out slowly through your lips. Your hand on your belly should move in as you breathe out.  Controlled coughing Learn and use controlled coughing to clear mucus from your lungs. Follow these steps: Lean your head a little forward. Breathe in deeply. Try to hold your breath for 3 seconds. Keep your mouth slightly open while coughing 2 times. Spit any mucus out into a tissue. Rest and do the steps again 1 or 2 times as needed. General instructions Make sure you get all the shots (vaccines) that your doctor recommends. Ask your doctor about a flu shot and a pneumonia shot. Use oxygen therapy and pulmonary rehabilitation if told by your doctor. If you need home oxygen therapy, ask your doctor if you should buy a tool to measure your oxygen level (oximeter). Make a COPD action plan with your doctor. This helps you   to know what to do if you feel worse than usual. Manage any other conditions you have as told by your doctor. Avoid going outside when it is very hot, cold, or humid. Avoid people who have a sickness you can catch (contagious). Keep all follow-up  visits. Contact a doctor if: You cough up more mucus than usual. There is a change in the color or thickness of the mucus. It is harder to breathe than usual. Your breathing is faster than usual. You have trouble sleeping. You need to use your medicines more often than usual. You have trouble doing your normal activities such as getting dressed or walking around the house. Get help right away if: You have shortness of breath while resting. You have shortness of breath that stops you from: Being able to talk. Doing normal activities. Your chest hurts for longer than 5 minutes. Your skin color is more blue than usual. Your pulse oximeter shows that you have low oxygen for longer than 5 minutes. You have a fever. You feel too tired to breathe normally. These symptoms may represent a serious problem that is an emergency. Do not wait to see if the symptoms will go away. Get medical help right away. Call your local emergency services (911 in the [place] Do not drive yourself to the hospital. Summary Chronic obstructive pulmonary disease (COPD) is a long-term lung problem. The way your lungs work will never return to normal. Usually the condition gets worse over time. There are things you can do to keep yourself as healthy as possible. Take over-the-counter and prescription medicines only as told by your doctor. If you smoke, stop. Smoking makes the problem worse. This information is not intended to replace advice given to you by your health care provider. Make sure you discuss any questions you have with your health care provider. Document Revised: 01/02/2020 Document Reviewed: 01/03/2020 [employer] Patient Education  2024 [employer]  

## 2022-08-05 NOTE — Progress Notes (Signed)
[facility] [address] [place], [place] [address]  Pulmonary Sleep Medicine   Office Visit Note  Patient Name: Marcus Delacruz DOB: Nov 09, 1940 MRN [id number]  Date of Service: 08/05/2022  Complaints/HPI: He states he has been sick with cough. Patient had a CXR done shows no pneumonia. Patient has also been on steroids. States he is not smoking. He did have covid in the past. Recent test was negative. Patient has not been tested for flu. Patient states he feels worn out. Cough is noted. Cough is not worse at night. He does not notice anything after a meal. Had stroke but years ago  Office Spirometry Results:     ROS  General: (-) fever, (-) chills, (-) night sweats, (-) weakness Skin: (-) rashes, (-) itching,. Eyes: (-) visual changes, (-) redness, (-) itching. Nose and Sinuses: (-) nasal stuffiness or itchiness, (-) postnasal drip, (-) nosebleeds, (-) sinus trouble. Mouth and Throat: (-) sore throat, (-) hoarseness. Neck: (-) swollen glands, (-) enlarged thyroid, (-) neck pain. Respiratory: + cough, (-) bloody sputum, + shortness of breath, + wheezing. Cardiovascular: - ankle swelling, (-) chest pain. Lymphatic: (-) lymph node enlargement. Neurologic: (-) numbness, (-) tingling. Psychiatric: (-) anxiety, (-) depression   Current Medication: Outpatient Encounter Medications as of 08/05/2022  Medication Sig   albuterol (VENTOLIN HFA) 108 (90 Base) MCG/ACT inhaler INHALE 2 PUFFS BY MOUTH EVERY 6 HOURS AS NEEDED FOR SHORTNESS OF BREATH OR WHEEZING   Albuterol-Budesonide (AIRSUPRA) 90-80 MCG/ACT AERO Inhale 1 puff into the lungs every 6 (six) hours as needed (SOB, wheezing).   amLODipine (NORVASC) 2.5 MG tablet TAKE 1 TABLET BY MOUTH EVERY DAY   aspirin 81 MG chewable tablet Chew 81 mg by mouth daily.   ezetimibe (ZETIA) 10 MG tablet TAKE (1) TABLET BY MOUTH EVERY DAY   fexofenadine (ALLERGY RELIEF) 180 MG tablet TAKE (1) TABLET BY MOUTH EVERY DAY    finasteride (PROSCAR) 5 MG tablet Take 1 tablet (5 mg total) by mouth daily.   Fluticasone Propionate, Inhal, (FLOVENT DISKUS) 50 MCG/ACT AEPB Inhale 1 Inhalation into the lungs every 12 (twelve) hours.   Fluticasone-Umeclidin-Vilant (TRELEGY ELLIPTA) 100-62.5-25 MCG/ACT AEPB Inhale 1 puff into the lungs daily.   ibuprofen (ADVIL) 800 MG tablet TAKE 1 TABLET BY MOUTH THREE TIMES DAILY AS NEEDED   ipratropium-albuterol (DUONEB) 0.5-2.5 (3) MG/3ML SOLN Take 3 mLs by nebulization every 6 (six) hours as needed.   losartan-hydrochlorothiazide (HYZAAR) 100-25 MG tablet Take 1 tablet by mouth daily.   montelukast (SINGULAIR) 10 MG tablet Take 1 tablet daily   NON FORMULARY cpap device   predniSONE (DELTASONE) 10 MG tablet Take 1 tablet (10 mg total) by mouth daily with breakfast. Taper 4,4,4,3,3,3,2,2,2,1,1,1   rosuvastatin (CRESTOR) 5 MG tablet TAKE (1) TABLET BY MOUTH EVERY DAY   Facility-Administered Encounter Medications as of 08/05/2022  Medication   ipratropium-albuterol (DUONEB) 0.5-2.5 (3) MG/3ML nebulizer solution 3 mL    Surgical History: Past Surgical History:  Procedure Laterality Date   ARTERY REPAIR     BACK SURGERY     CHOLECYSTECTOMY     COLONOSCOPY  2013   cleared   HERNIA REPAIR      Medical History: Past Medical History:  Diagnosis Date   Allergic rhinitis due to pollen    Asthma    BPH (benign prostatic hyperplasia)    Cardiomegaly    Chronic obstructive pulmonary disease (COPD) (HCC)    Hypercholesteremia    Hypersomnia, unspecified    Hypertension  Obstructive sleep apnea (adult) (pediatric)    Shortness of breath    Snoring    Solitary pulmonary nodule     Family History: Family History  Problem Relation Age of Onset   COPD Mother    Cancer Father        lung   Asthma Son    Prostate cancer Neg Hx    Kidney disease Neg Hx     Social History: Social History   Socioeconomic History   Marital status: Married    Spouse name: Not on file    Number of children: 3   Years of education: Not on file   Highest education level: Associate degree: academic program  Occupational History   Occupation: retired  Tobacco Use   Smoking status: Former    Packs/day: 1.00    Years: 20.00    Additional pack years: 0.00    Total pack years: 20.00    Types: Cigarettes    Quit date: 08/09/1983    Years since quitting: 39.0    Passive exposure: Past   Smokeless tobacco: Never   Tobacco comments:    quit smoking over 40 years ago  Vaping Use   Vaping Use: Never used  Substance and Sexual Activity   Alcohol use: Yes    Alcohol/week: 0.0 standard drinks of alcohol    Comment: once monthly   Drug use: No   Sexual activity: Not Currently  Other Topics Concern   Not on file  Social History Narrative   Not on file   Social Determinants of Health   Financial Resource Strain: Low Risk  (03/17/2022)   Overall Financial Resource Strain (CARDIA)    Difficulty of Paying Living Expenses: Not hard at all  Food Insecurity: No Food Insecurity (03/17/2022)   Hunger Vital Sign    Worried About Running Out of Food in the Last Year: Never true    Ran Out of Food in the Last Year: Never true  Transportation Needs: No Transportation Needs (03/17/2022)   PRAPARE - Administrator, Civil Service (Medical): No    Lack of Transportation (Non-Medical): No  Physical Activity: Insufficiently Active (03/17/2022)   Exercise Vital Sign    Days of Exercise per Week: 1 day    Minutes of Exercise per Session: 30 min  Stress: No Stress Concern Present (03/17/2022)   [employer] of Occupational Health - Occupational Stress Questionnaire    Feeling of Stress : Not at all  Social Connections: Moderately Isolated (03/17/2022)   Social Connection and Isolation Panel [NHANES]    Frequency of Communication with Friends and Family: More than three times a week    Frequency of Social Gatherings with Friends and Family: Once a week    Attends Religious Services:  Never    Database administrator or Organizations: No    Attends Banker Meetings: Never    Marital Status: Married  Catering manager Violence: Not At Risk (03/17/2022)   Humiliation, Afraid, Rape, and Kick questionnaire    Fear of Current or Ex-Partner: No    Emotionally Abused: No    Physically Abused: No    Sexually Abused: No    Vital Signs: Blood pressure 110/60, pulse 83, temperature 97.6 F (36.4 C), resp. rate 16, height 5\' 10"  (1.778 m), weight 174 lb (78.9 kg), SpO2 95 %.  Examination: General Appearance: The patient is well-developed, well-nourished, and in no distress. Skin: Gross inspection of skin unremarkable. Head: normocephalic, no gross deformities. Eyes:  no gross deformities noted. ENT: ears appear grossly normal no exudates. Neck: Supple. No thyromegaly. No LAD. Respiratory: few rhocnhi noted. Cardiovascular: Normal S1 and S2 without murmur or rub. Extremities: No cyanosis. pulses are equal. Neurologic: Alert and oriented. No involuntary movements.  LABS: Recent Results (from the past 2160 hour(s))  Pulmonary Function Test     Status: None   Collection Time: 05/13/22  8:32 AM  Result Value Ref Range   FEV1     FVC     FEV1/FVC     TLC     DLCO    Comprehensive Metabolic Panel (CMET)     Status: Abnormal   Collection Time: 06/30/22  2:19 PM  Result Value Ref Range   Glucose 98 70 - 99 mg/dL   BUN 18 8 - 27 mg/dL   Creatinine, Ser 1.61 0.76 - 1.27 mg/dL   eGFR 79 >09 UE/AVW/0.98   BUN/Creatinine Ratio 19 10 - 24   Sodium 141 134 - 144 mmol/L   Potassium 3.9 3.5 - 5.2 mmol/L   Chloride 102 96 - 106 mmol/L   CO2 24 20 - 29 mmol/L   Calcium 9.6 8.6 - 10.2 mg/dL   Total Protein 6.7 6.0 - 8.5 g/dL   Albumin 4.3 3.8 - 4.8 g/dL   Globulin, Total 2.4 1.5 - 4.5 g/dL   Albumin/Globulin Ratio 1.8 1.2 - 2.2   Bilirubin Total 0.4 0.0 - 1.2 mg/dL   Alkaline Phosphatase 68 44 - 121 IU/L   AST 57 (H) 0 - 40 IU/L   ALT 24 0 - 44 IU/L  Lipid Panel  With LDL/HDL Ratio     Status: Abnormal   Collection Time: 06/30/22  2:19 PM  Result Value Ref Range   Cholesterol, Total 198 100 - 199 mg/dL   Triglycerides 119 0 - 149 mg/dL   HDL 56 >14 mg/dL   VLDL Cholesterol Cal 21 5 - 40 mg/dL   LDL Chol Calc (NIH) 782 (H) 0 - 99 mg/dL   LDL/HDL Ratio 2.2 0.0 - 3.6 ratio    Comment:                                     LDL/HDL Ratio                                             Men  Women                               1/2 Avg.Risk  1.0    1.5                                   Avg.Risk  3.6    3.2                                2X Avg.Risk  6.2    5.0                                3X Avg.Risk  8.0    6.1  Radiology: DG Chest 2 View  Result Date: 08/01/2022 CLINICAL DATA:  Cough, shortness of breath. EXAM: CHEST - 2 VIEW COMPARISON:  Aug 05, 2017. FINDINGS: The heart size and mediastinal contours are within normal limits. Both lungs are clear. The visualized skeletal structures are unremarkable. IMPRESSION: No active cardiopulmonary disease. Electronically Signed   By: Lupita Raider M.D.   On: 08/01/2022 10:01    No results found.  DG Chest 2 View  Result Date: 08/01/2022 CLINICAL DATA:  Cough, shortness of breath. EXAM: CHEST - 2 VIEW COMPARISON:  Aug 05, 2017. FINDINGS: The heart size and mediastinal contours are within normal limits. Both lungs are clear. The visualized skeletal structures are unremarkable. IMPRESSION: No active cardiopulmonary disease. Electronically Signed   By: Lupita Raider M.D.   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 01/21/2022   Chronic obstructive pulmonary disease with acute exacerbation (HCC) 07/27/2017   Asthma, chronic, severe persistent, uncomplicated 08/18/2016   Seasonal allergic rhinitis due to pollen 08/18/2016   Bilateral carotid artery stenosis 04/03/2016    Bilateral hydrocele 11/22/2014   BPH with obstruction/lower urinary tract symptoms 10/05/2014   Epididymal cyst 10/05/2014   Familial multiple lipoprotein-type hyperlipidemia 07/14/2014   Laboratory animal allergy 07/14/2014   Acute arthropathy 07/14/2014   Routine general medical examination at a health care facility 07/14/2014   Asthma with exacerbation 07/14/2014   Essential (primary) hypertension 07/14/2014   Screening for depression 07/14/2014   Noncompliance w/medication treatment due to intermit use of medication 11/23/2013   Asthma, chronic 08/08/2013    1. Chronic obstructive pulmonary disease, unspecified COPD type (HCC) On inhalers and is on steroids. Has some increased symptoms will place on levaquin  2. OSA on CPAP CPAP therapy will be continued  3. Chronic cough - levofloxacin (LEVAQUIN) 750 MG tablet; Take 1 tablet (750 mg total) by mouth daily.  Dispense: 7 tablet; Refill: 0 - DG UGI W DOUBLE CM (HD BA); Future    General Counseling: I have discussed the findings of the evaluation and examination with Izaya.  I have also discussed any further diagnostic evaluation thatmay be needed or ordered today. Erroll verbalizes understanding of the findings of todays visit. We also reviewed his medications today and discussed drug interactions and side effects including but not limited excessive drowsiness and altered mental states. We also discussed that there is always a risk not just to him but also people around him. he has been encouraged to call the office with any questions or concerns that should arise related to todays visit.  No orders of the defined types were placed in this encounter.    Time spent: 20  I have personally obtained a history, examined the patient, evaluated laboratory and imaging results, formulated the assessment and plan and placed orders.    Yevonne Pax, MD [facility] Pulmonary and Critical Care Sleep medicine

## 2022-08-06 ENCOUNTER — Telehealth: Payer: Self-pay | Admitting: Internal Medicine

## 2022-08-06 NOTE — Telephone Encounter (Signed)
Notified patient of UGI appointment date, arrival time, location and npo after midnight-Toni 

## 2022-08-13 ENCOUNTER — Ambulatory Visit
Admission: RE | Admit: 2022-08-13 | Discharge: 2022-08-13 | Disposition: A | Discharge status: Home / Self Care | Payer: Medicare Other | Source: Ambulatory Visit | Attending: Internal Medicine | Admitting: Internal Medicine

## 2022-08-13 DIAGNOSIS — R053 Chronic cough: Secondary | ICD-10-CM | POA: Diagnosis not present

## 2022-08-13 DIAGNOSIS — K224 Dyskinesia of esophagus: Secondary | ICD-10-CM | POA: Diagnosis not present

## 2022-08-13 DIAGNOSIS — K449 Diaphragmatic hernia without obstruction or gangrene: Secondary | ICD-10-CM | POA: Diagnosis not present

## 2022-08-13 DIAGNOSIS — K219 Gastro-esophageal reflux disease without esophagitis: Secondary | ICD-10-CM | POA: Diagnosis not present

## 2022-08-17 ENCOUNTER — Other Ambulatory Visit: Payer: Self-pay | Admitting: Family Medicine

## 2022-08-17 DIAGNOSIS — J4550 Severe persistent asthma, uncomplicated: Secondary | ICD-10-CM

## 2022-08-18 NOTE — Telephone Encounter (Signed)
Requested Prescriptions  Pending Prescriptions Disp Refills   albuterol (VENTOLIN HFA) 108 (90 Base) MCG/ACT inhaler [Pharmacy Med Name: VENTOLIN HFA INH W/DOS CTR 200PUFFS] 18 g 1    Sig: INHALE 2 PUFFS BY MOUTH EVERY 6 HOURS AS NEEDED FOR SHORTNESS OF BREATH OR WHEEZING     Pulmonology:  Beta Agonists 2 Passed - 08/17/2022 10:24 AM      Passed - Last BP in normal range    BP Readings from Last 1 Encounters:  08/05/22 110/60         Passed - Last Heart Rate in normal range    Pulse Readings from Last 1 Encounters:  08/05/22 83         Passed - Valid encounter within last 12 months    Recent Outpatient Visits           2 weeks ago Mild intermittent asthma with acute exacerbation   [place] [facility]   2 weeks ago Mild intermittent asthma with acute exacerbation   [place] [facility]   1 month ago Chronic obstructive pulmonary disease with acute exacerbation (HCC)   [place] [facility]   1 month ago Essential (primary) hypertension   [place] [facility]   5 months ago Essential (primary) hypertension   [place] [facility]       Future Appointments             In 4 months Duanne Limerick, MD [facility] Primary Care & Sports Medicine at [facility]   In 10 months McGowan, Elana Alm [facility] [place]

## 2022-09-09 ENCOUNTER — Other Ambulatory Visit: Payer: Self-pay | Admitting: Family Medicine

## 2022-09-09 DIAGNOSIS — J301 Allergic rhinitis due to pollen: Secondary | ICD-10-CM

## 2022-09-09 DIAGNOSIS — J4550 Severe persistent asthma, uncomplicated: Secondary | ICD-10-CM

## 2022-09-09 DIAGNOSIS — E7849 Other hyperlipidemia: Secondary | ICD-10-CM

## 2022-10-03 ENCOUNTER — Other Ambulatory Visit: Payer: Self-pay | Admitting: Family Medicine

## 2022-10-03 DIAGNOSIS — G8929 Other chronic pain: Secondary | ICD-10-CM

## 2022-10-03 NOTE — Telephone Encounter (Signed)
Requested medication (s) are due for refill today: Yes  Requested medication (s) are on the active medication list: Yes  Last refill:  07/03/22  Future visit scheduled: Yes  Notes to clinic:  Manual review.    Requested Prescriptions  Pending Prescriptions Disp Refills   ibuprofen (ADVIL) 800 MG tablet [Pharmacy Med Name: IBUPROFEN 800MG  TABLETS] 270 tablet 0    Sig: TAKE 1 TABLET BY MOUTH THREE TIMES DAILY AS NEEDED     Analgesics:  NSAIDS Failed - 10/03/2022 11:10 AM      Failed - Manual Review: Labs are only required if the patient has taken medication for more than 8 weeks.      Failed - HGB in normal range and within 360 days    Hemoglobin  Date Value Ref Range Status  12/20/2020 15.6 13.0 - 17.7 g/dL Final         Failed - PLT in normal range and within 360 days    Platelets  Date Value Ref Range Status  12/20/2020 312 150 - 450 x10E3/uL Final         Failed - HCT in normal range and within 360 days    Hematocrit  Date Value Ref Range Status  12/20/2020 46.5 37.5 - 51.0 % Final         Passed - Cr in normal range and within 360 days    Creatinine, Ser  Date Value Ref Range Status  06/30/2022 0.97 0.76 - 1.27 mg/dL Final         Passed - eGFR is 30 or above and within 360 days    GFR calc Af Amer  Date Value Ref Range Status  02/20/2020 56 (L) >59 mL/min/1.73 Final    Comment:    **In accordance with recommendations from the NKF-ASN Task force,**   [facility] is in the process of updating its eGFR calculation to the   2021 CKD-EPI creatinine equation that estimates kidney function   without a race variable.    GFR calc non Af Amer  Date Value Ref Range Status  02/20/2020 49 (L) >59 mL/min/1.73 Final   eGFR  Date Value Ref Range Status  06/30/2022 79 >59 mL/min/1.73 Final         Passed - Patient is not pregnant      Passed - Valid encounter within last 12 months    Recent Outpatient Visits           2 months ago Mild intermittent asthma with  acute exacerbation   [place] [facility]   2 months ago Mild intermittent asthma with acute exacerbation   [place] [facility]   2 months ago Chronic obstructive pulmonary disease with acute exacerbation (HCC)   [place] [facility]   3 months ago Essential (primary) hypertension   [place] [facility]   7 months ago Essential (primary) hypertension   [place] [facility]       Future Appointments             In 2 months Duanne Limerick, MD [facility] Care & Sports Medicine at [facility]   In 9 months McGowan, Elana Alm [facility] [place]

## 2022-10-14 DIAGNOSIS — G4733 Obstructive sleep apnea (adult) (pediatric): Secondary | ICD-10-CM | POA: Diagnosis not present

## 2022-10-15 ENCOUNTER — Ambulatory Visit: Payer: Medicare Other

## 2022-10-28 DIAGNOSIS — H60542 Acute eczematoid otitis externa, left ear: Secondary | ICD-10-CM | POA: Diagnosis not present

## 2022-10-28 DIAGNOSIS — H903 Sensorineural hearing loss, bilateral: Secondary | ICD-10-CM | POA: Diagnosis not present

## 2022-10-28 DIAGNOSIS — H6123 Impacted cerumen, bilateral: Secondary | ICD-10-CM | POA: Diagnosis not present

## 2022-11-11 ENCOUNTER — Ambulatory Visit: Payer: Medicare Other | Admitting: Internal Medicine

## 2022-11-12 ENCOUNTER — Ambulatory Visit: Payer: Medicare Other

## 2022-11-12 DIAGNOSIS — G4733 Obstructive sleep apnea (adult) (pediatric): Secondary | ICD-10-CM

## 2022-11-12 NOTE — Progress Notes (Signed)
95 percentile pressure 11   95th percentile leak 1.5   apnea index 0.9 /hr  apnea-hypopnea index  3.0 /hr   total days used  >4 hr 17 days  total days used <4 hr 0 days  Total compliance 57 percent  He is doing really good, [place], does not sleep without cpap. No problems or questions at this time.    Pt was seen by Tresa Endo  RRT/RCP  from [facility]

## 2022-11-25 ENCOUNTER — Ambulatory Visit: Payer: Medicare Other | Admitting: Internal Medicine

## 2022-11-25 ENCOUNTER — Encounter: Payer: Self-pay | Admitting: Internal Medicine

## 2022-11-25 VITALS — BP 135/70 | HR 72 | Temp 98.5°F | Resp 16 | Ht 70.0 in | Wt 183.0 lb

## 2022-11-25 DIAGNOSIS — J449 Chronic obstructive pulmonary disease, unspecified: Secondary | ICD-10-CM | POA: Diagnosis not present

## 2022-11-25 DIAGNOSIS — G4733 Obstructive sleep apnea (adult) (pediatric): Secondary | ICD-10-CM | POA: Diagnosis not present

## 2022-11-25 NOTE — Patient Instructions (Signed)

## 2022-11-25 NOTE — Progress Notes (Signed)
[facility] [address] [place], [place] [address]  Pulmonary Sleep Medicine   Office Visit Note  Patient Name: Marcus Delacruz DOB: 01-31-1941 MRN [id number]  Date of Service: 11/25/2022  Complaints/HPI: He states he is feeling much better. States that the abx helped and his cough is better and no shortness of breath. He is in the donut hole and needs some samples of trelegy.patient has been doing well overall.  Has noted no chest pain or congestion noted.  Patient has not had any admissions to the hospital.  Office Spirometry Results:     ROS  General: (-) fever, (-) chills, (-) night sweats, (-) weakness Skin: (-) rashes, (-) itching,. Eyes: (-) visual changes, (-) redness, (-) itching. Nose and Sinuses: (-) nasal stuffiness or itchiness, (-) postnasal drip, (-) nosebleeds, (-) sinus trouble. Mouth and Throat: (-) sore throat, (-) hoarseness. Neck: (-) swollen glands, (-) enlarged thyroid, (-) neck pain. Respiratory: - cough, (-) bloody sputum, - shortness of breath, - wheezing. Cardiovascular: - ankle swelling, (-) chest pain. Lymphatic: (-) lymph node enlargement. Neurologic: (-) numbness, (-) tingling. Psychiatric: (-) anxiety, (-) depression   Current Medication: Outpatient Encounter Medications as of 11/25/2022  Medication Sig   albuterol (VENTOLIN HFA) 108 (90 Base) MCG/ACT inhaler INHALE 2 PUFFS BY MOUTH EVERY 6 HOURS AS NEEDED FOR SHORTNESS OF BREATH OR WHEEZING   Albuterol-Budesonide (AIRSUPRA) 90-80 MCG/ACT AERO Inhale 1 puff into the lungs every 6 (six) hours as needed (SOB, wheezing).   amLODipine (NORVASC) 2.5 MG tablet TAKE 1 TABLET BY MOUTH EVERY DAY   aspirin 81 MG chewable tablet Chew 81 mg by mouth daily.   ezetimibe (ZETIA) 10 MG tablet TAKE 1 TABLET BY MOUTH EVERY DAY   fexofenadine (ALLERGY RELIEF) 180 MG tablet TAKE (1) TABLET BY MOUTH EVERY DAY   finasteride (PROSCAR) 5 MG tablet Take 1 tablet (5 mg total) by mouth daily.    Fluticasone Propionate, Inhal, (FLOVENT DISKUS) 50 MCG/ACT AEPB Inhale 1 Inhalation into the lungs every 12 (twelve) hours.   Fluticasone-Umeclidin-Vilant (TRELEGY ELLIPTA) 100-62.5-25 MCG/ACT AEPB Inhale 1 puff into the lungs daily.   ibuprofen (ADVIL) 800 MG tablet TAKE 1 TABLET BY MOUTH THREE TIMES DAILY AS NEEDED   ipratropium-albuterol (DUONEB) 0.5-2.5 (3) MG/3ML SOLN Take 3 mLs by nebulization every 6 (six) hours as needed.   levofloxacin (LEVAQUIN) 750 MG tablet Take 1 tablet (750 mg total) by mouth daily.   losartan-hydrochlorothiazide (HYZAAR) 100-25 MG tablet Take 1 tablet by mouth daily.   montelukast (SINGULAIR) 10 MG tablet TAKE 1 TABLET BY MOUTH EVERY DAY   NON FORMULARY cpap device   predniSONE (DELTASONE) 10 MG tablet Take 1 tablet (10 mg total) by mouth daily with breakfast. Taper 4,4,4,3,3,3,2,2,2,1,1,1   rosuvastatin (CRESTOR) 5 MG tablet TAKE 1 TABLET BY MOUTH EVERY DAY   Facility-Administered Encounter Medications as of 11/25/2022  Medication   ipratropium-albuterol (DUONEB) 0.5-2.5 (3) MG/3ML nebulizer solution 3 mL    Surgical History: Past Surgical History:  Procedure Laterality Date   ARTERY REPAIR     BACK SURGERY     CHOLECYSTECTOMY     COLONOSCOPY  2013   cleared   HERNIA REPAIR      Medical History: Past Medical History:  Diagnosis Date   Allergic rhinitis due to pollen    Asthma    BPH (benign prostatic hyperplasia)    Cardiomegaly    Chronic obstructive pulmonary disease (COPD) (HCC)    Hypercholesteremia    Hypersomnia, unspecified  Hypertension    Obstructive sleep apnea (adult) (pediatric)    Shortness of breath    Snoring    Solitary pulmonary nodule     Family History: Family History  Problem Relation Age of Onset   COPD Mother    Cancer Father        lung   Asthma Son    Prostate cancer Neg Hx    Kidney disease Neg Hx     Social History: Social History   Socioeconomic History   Marital status: Married    Spouse name:  Not on file   Number of children: 3   Years of education: Not on file   Highest education level: Associate degree: academic program  Occupational History   Occupation: retired  Tobacco Use   Smoking status: Former    Current packs/day: 0.00    Average packs/day: 1 pack/day for 20.0 years (20.0 ttl pk-yrs)    Types: Cigarettes    Start date: 08/09/1963    Quit date: 08/09/1983    Years since quitting: 39.3    Passive exposure: Past   Smokeless tobacco: Never   Tobacco comments:    quit smoking over 40 years ago  Vaping Use   Vaping status: Never Used  Substance and Sexual Activity   Alcohol use: Yes    Alcohol/week: 0.0 standard drinks of alcohol    Comment: once monthly   Drug use: No   Sexual activity: Not Currently  Other Topics Concern   Not on file  Social History Narrative   Not on file   Social Determinants of Health   Financial Resource Strain: Low Risk  (03/17/2022)   Overall Financial Resource Strain (CARDIA)    Difficulty of Paying Living Expenses: Not hard at all  Food Insecurity: No Food Insecurity (03/17/2022)   Hunger Vital Sign    Worried About Running Out of Food in the Last Year: Never true    Ran Out of Food in the Last Year: Never true  Transportation Needs: No Transportation Needs (03/17/2022)   PRAPARE - Administrator, Civil Service (Medical): No    Lack of Transportation (Non-Medical): No  Physical Activity: Insufficiently Active (03/17/2022)   Exercise Vital Sign    Days of Exercise per Week: 1 day    Minutes of Exercise per Session: 30 min  Stress: No Stress Concern Present (03/17/2022)   [employer] of Occupational Health - Occupational Stress Questionnaire    Feeling of Stress : Not at all  Social Connections: Moderately Isolated (03/17/2022)   Social Connection and Isolation Panel [NHANES]    Frequency of Communication with Friends and Family: More than three times a week    Frequency of Social Gatherings with Friends and Family: Once  a week    Attends Religious Services: Never    Database administrator or Organizations: No    Attends Banker Meetings: Never    Marital Status: Married  Catering manager Violence: Not At Risk (03/17/2022)   Humiliation, Afraid, Rape, and Kick questionnaire    Fear of Current or Ex-Partner: No    Emotionally Abused: No    Physically Abused: No    Sexually Abused: No    Vital Signs: Blood pressure 135/70, pulse 72, temperature 98.5 F (36.9 C), resp. rate 16, height 5\' 10"  (1.778 m), weight 183 lb (83 kg), SpO2 96%.  Examination: General Appearance: The patient is well-developed, well-nourished, and in no distress. Skin: Gross inspection of skin unremarkable. Head: normocephalic,  no gross deformities. Eyes: no gross deformities noted. ENT: ears appear grossly normal no exudates. Neck: Supple. No thyromegaly. No LAD. Respiratory: no rhonchi noted. Cardiovascular: Normal S1 and S2 without murmur or rub. Extremities: No cyanosis. pulses are equal. Neurologic: Alert and oriented. No involuntary movements.  LABS: No results found for this or any previous visit (from the past 2160 hour(s)).  Radiology: DG UGI W DOUBLE CM (HD BA)  Result Date: 08/13/2022 CLINICAL DATA:  Chronic cough EXAM: UPPER GI SERIES WITH HIGH DENSITY WITHOUT KUB TECHNIQUE: Combined double and single contrast examination was performed using effervescent crystals, high-density barium and thin liquid barium. This exam was performed by Mina Marble, PA, and was supervised and interpreted by Tobey Grim. Jayme Cloud, MD FLUOROSCOPY: Radiation Exposure Index (as provided by the fluoroscopic device): 29.40 mGy Kerma COMPARISON:  None Available. FINDINGS: Esophagus: Normal appearance. Esophageal motility: Mild esophageal dysmotility, with tertiary contractions observed at multiple points during the examination. Gastroesophageal reflux: Small volume, spontaneous gastroesophageal reflux to the lower third of the esophagus.  Ingested 13mm barium tablet: Passed normally/Became stuck/Not given Stomach: Normal appearance.  Small hiatal hernia. Gastric emptying: Normal. Duodenum: Normal appearance. Other:  None. IMPRESSION: 1.  Mild esophageal dysmotility. 2.  Small volume spontaneous gastroesophageal reflux. 3.  Small hiatal hernia. Electronically Signed   By: Jearld Lesch M.D.   On: 08/13/2022 11:39    No results found.  No results found.  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 01/21/2022   Chronic obstructive pulmonary disease with acute exacerbation (HCC) 07/27/2017   Asthma, chronic, severe persistent, uncomplicated 08/18/2016   Seasonal allergic rhinitis due to pollen 08/18/2016   Bilateral carotid artery stenosis 04/03/2016   Bilateral hydrocele 11/22/2014   BPH with obstruction/lower urinary tract symptoms 10/05/2014   Epididymal cyst 10/05/2014   Familial multiple lipoprotein-type hyperlipidemia 07/14/2014   Laboratory animal allergy 07/14/2014   Acute arthropathy 07/14/2014   Routine general medical examination at a health care facility 07/14/2014   Asthma with exacerbation 07/14/2014   Essential (primary) hypertension 07/14/2014   Screening for depression 07/14/2014   Noncompliance w/medication treatment due to intermit use of medication 11/23/2013   Asthma, chronic 08/08/2013     1. Obstructive sleep apnea [G47.33] Patient has obstructive sleep apnea and is on appropriate treatment the plan is going to be to continue with CPAP therapy.  2. Chronic obstructive pulmonary disease, unspecified COPD type (HCC) Inhalers nebulizers as necessary will continue to follow along closely.  General Counseling: I have discussed the findings of the evaluation and examination with Marcus Delacruz.  I have also discussed any further diagnostic evaluation thatmay be needed or ordered today. Manus verbalizes  understanding of the findings of todays visit. We also reviewed his medications today and discussed drug interactions and side effects including but not limited excessive drowsiness and altered mental states. We also discussed that there is always a risk not just to him but also people around him. he has been encouraged to call the office with any questions or concerns that should arise related to todays visit.  No orders of the defined types were placed in this encounter.    Time spent: 22  I have personally obtained a history, examined the patient, evaluated laboratory and imaging results, formulated the assessment and plan and placed orders.    Yevonne Pax, MD [facility] Pulmonary and Critical Care Sleep medicine

## 2022-12-01 ENCOUNTER — Other Ambulatory Visit: Payer: Self-pay | Admitting: Family Medicine

## 2022-12-01 DIAGNOSIS — G8929 Other chronic pain: Secondary | ICD-10-CM

## 2022-12-26 ENCOUNTER — Other Ambulatory Visit: Payer: Self-pay | Admitting: Family Medicine

## 2022-12-26 DIAGNOSIS — I10 Essential (primary) hypertension: Secondary | ICD-10-CM

## 2022-12-26 DIAGNOSIS — J301 Allergic rhinitis due to pollen: Secondary | ICD-10-CM

## 2022-12-26 NOTE — Telephone Encounter (Signed)
Requested Prescriptions  Pending Prescriptions Disp Refills   amLODipine (NORVASC) 2.5 MG tablet [Pharmacy Med Name: AMLODIPINE BESYLATE 2.5MG  TABLETS] 90 tablet 1    Sig: TAKE 1 TABLET BY MOUTH EVERY DAY     Cardiovascular: Calcium Channel Blockers 2 Passed - 12/26/2022 10:11 AM      Passed - Last BP in normal range    BP Readings from Last 1 Encounters:  11/25/22 135/70         Passed - Last Heart Rate in normal range    Pulse Readings from Last 1 Encounters:  11/25/22 72         Passed - Valid encounter within last 6 months    Recent Outpatient Visits           4 months ago Mild intermittent asthma with acute exacerbation   [place] [facility]   5 months ago Mild intermittent asthma with acute exacerbation   [place] [facility]   5 months ago Chronic obstructive pulmonary disease with acute exacerbation (HCC)   [place] [facility]   5 months ago Essential (primary) hypertension   [place] [facility], [facility]   9 months ago Essential (primary) hypertension   [place] [facility], [facility]       Future Appointments             In 3 weeks Duanne Limerick, MD [facility] at [facility], [facility]   In 6 months McG[place], [facility] [place]             ALLERGY RELIEF 180 MG tablet [Pharmacy Med Name: FEXOFENADINE 180MG  TABLETS (OTC)] 90 tablet 1    Sig: TAKE 1 TABLET BY MOUTH EVERY DAY     Ear, Nose, and Throat:  Antihistamines Passed - 12/26/2022 10:11 AM      Passed - Valid encounter within last 12 months    Recent Outpatient Visits           4 months ago Mild intermittent asthma with acute exacerbation   [place]  [facility]   5 months ago Mild intermittent asthma with acute exacerbation   [place] [facility]   5 months ago Chronic obstructive pulmonary disease with acute exacerbation (HCC)   [place] [facility], [facility]   5 months ago Essential (primary) hypertension   [place] [facility], [facility]   9 months ago Essential (primary) hypertension   [place] [facility], [facility]       Future Appointments             In 3 weeks Duanne Limerick, MD [facility] at [facility], [facility]   In 6 months McGowan, Elana Al[facility] [place]

## 2022-12-29 ENCOUNTER — Other Ambulatory Visit: Payer: Self-pay

## 2022-12-29 DIAGNOSIS — E7849 Other hyperlipidemia: Secondary | ICD-10-CM

## 2022-12-29 DIAGNOSIS — J4550 Severe persistent asthma, uncomplicated: Secondary | ICD-10-CM

## 2022-12-29 DIAGNOSIS — I10 Essential (primary) hypertension: Secondary | ICD-10-CM

## 2022-12-29 DIAGNOSIS — J301 Allergic rhinitis due to pollen: Secondary | ICD-10-CM

## 2022-12-29 MED ORDER — MONTELUKAST SODIUM 10 MG PO TABS: ORAL_TABLET | ORAL | 0 refills | Status: DC

## 2022-12-29 MED ORDER — LOSARTAN POTASSIUM-HCTZ 100-25 MG PO TABS
1.0000 | ORAL_TABLET | Freq: Every day | ORAL | 0 refills | Status: DC
Start: 2022-12-29 — End: 2023-01-22

## 2022-12-29 MED ORDER — ROSUVASTATIN CALCIUM 5 MG PO TABS
ORAL_TABLET | ORAL | 0 refills | Status: DC
Start: 2022-12-29 — End: 2023-01-22

## 2022-12-29 MED ORDER — EZETIMIBE 10 MG PO TABS
ORAL_TABLET | ORAL | 0 refills | Status: DC
Start: 2022-12-29 — End: 2023-01-22

## 2022-12-30 ENCOUNTER — Ambulatory Visit: Payer: Medicare Other | Admitting: Family Medicine

## 2023-01-09 DIAGNOSIS — H2513 Age-related nuclear cataract, bilateral: Secondary | ICD-10-CM | POA: Diagnosis not present

## 2023-01-09 DIAGNOSIS — Z83518 Family history of other specified eye disorder: Secondary | ICD-10-CM | POA: Diagnosis not present

## 2023-01-09 DIAGNOSIS — H43813 Vitreous degeneration, bilateral: Secondary | ICD-10-CM | POA: Diagnosis not present

## 2023-01-12 DIAGNOSIS — G4733 Obstructive sleep apnea (adult) (pediatric): Secondary | ICD-10-CM | POA: Diagnosis not present

## 2023-01-20 ENCOUNTER — Ambulatory Visit: Payer: Medicare Other | Admitting: Family Medicine

## 2023-01-22 ENCOUNTER — Encounter: Payer: Self-pay | Admitting: Family Medicine

## 2023-01-22 ENCOUNTER — Ambulatory Visit: Payer: Medicare Other | Admitting: Family Medicine

## 2023-01-22 VITALS — BP 122/68 | HR 62 | Resp 16 | Ht 70.0 in | Wt 183.0 lb

## 2023-01-22 DIAGNOSIS — E7849 Other hyperlipidemia: Secondary | ICD-10-CM | POA: Diagnosis not present

## 2023-01-22 DIAGNOSIS — J301 Allergic rhinitis due to pollen: Secondary | ICD-10-CM

## 2023-01-22 DIAGNOSIS — I10 Essential (primary) hypertension: Secondary | ICD-10-CM

## 2023-01-22 DIAGNOSIS — N401 Enlarged prostate with lower urinary tract symptoms: Secondary | ICD-10-CM

## 2023-01-22 DIAGNOSIS — J4550 Severe persistent asthma, uncomplicated: Secondary | ICD-10-CM | POA: Diagnosis not present

## 2023-01-22 DIAGNOSIS — N138 Other obstructive and reflux uropathy: Secondary | ICD-10-CM

## 2023-01-22 MED ORDER — ROSUVASTATIN CALCIUM 5 MG PO TABS: ORAL_TABLET | ORAL | 0 refills | Status: DC

## 2023-01-22 MED ORDER — ROSUVASTATIN CALCIUM 5 MG PO TABS: ORAL_TABLET | ORAL | 1 refills | Status: AC

## 2023-01-22 MED ORDER — AMLODIPINE BESYLATE 2.5 MG PO TABS: ORAL_TABLET | ORAL | 1 refills | Status: AC

## 2023-01-22 MED ORDER — MONTELUKAST SODIUM 10 MG PO TABS: ORAL_TABLET | ORAL | 0 refills | Status: DC

## 2023-01-22 MED ORDER — EZETIMIBE 10 MG PO TABS: ORAL_TABLET | ORAL | 1 refills | Status: AC

## 2023-01-22 MED ORDER — EZETIMIBE 10 MG PO TABS: ORAL_TABLET | ORAL | 0 refills | Status: DC

## 2023-01-22 MED ORDER — LOSARTAN POTASSIUM-HCTZ 100-25 MG PO TABS: 1.0000 | ORAL_TABLET | Freq: Every day | ORAL | 0 refills | Status: DC

## 2023-01-22 MED ORDER — MONTELUKAST SODIUM 10 MG PO TABS: ORAL_TABLET | ORAL | 1 refills | Status: AC

## 2023-01-22 MED ORDER — LOSARTAN POTASSIUM-HCTZ 100-25 MG PO TABS: 1.0000 | ORAL_TABLET | Freq: Every day | ORAL | 1 refills | Status: DC

## 2023-01-22 MED ORDER — ALBUTEROL SULFATE HFA 108 (90 BASE) MCG/ACT IN AERS
INHALATION_SPRAY | RESPIRATORY_TRACT | 1 refills | Status: AC
Start: 2023-01-22 — End: ?

## 2023-01-22 MED ORDER — FINASTERIDE 5 MG PO TABS: 5.0000 mg | ORAL_TABLET | Freq: Every day | ORAL | 3 refills | Status: DC

## 2023-01-22 NOTE — Progress Notes (Signed)
Date:  01/22/2023   Name:  Marcus Delacruz   DOB:  07/06/1940   MRN:  [id number]   Chief Complaint: Follow-up  Hyperlipidemia This is a chronic problem. The current episode started more than 1 year ago. The problem is controlled. Recent lipid tests were reviewed and are normal. He has no history of chronic renal disease. There are no known factors aggravating his hyperlipidemia. Pertinent negatives include no chest pain, focal sensory loss, focal weakness, leg pain, myalgias or shortness of breath. Current antihyperlipidemic treatment includes statins. The current treatment provides moderate improvement of lipids. There are no compliance problems.  Risk factors for coronary artery disease include dyslipidemia and hypertension.  Hypertension This is a chronic problem. The problem has been gradually improving since onset. The problem is controlled. Pertinent negatives include no anxiety, chest pain, headaches, neck pain, palpitations or shortness of breath. Risk factors for coronary artery disease include male gender and dyslipidemia. Past treatments include diuretics, angiotensin blockers and calcium channel blockers. The current treatment provides moderate improvement. There are no compliance problems.  There is no history of chronic renal disease, a hypertension causing med or renovascular disease.  Asthma There is no cough, hemoptysis, shortness of breath or wheezing. The current episode started in the past 7 days. The problem has been gradually improving. Pertinent negatives include no chest pain, ear pain, fever, headaches, heartburn, myalgias, postnasal drip, rhinorrhea or sore throat. He reports minimal improvement on treatment. His past medical history is significant for asthma.    Lab Results  Component Value Date   NA 141 06/30/2022   K 3.9 06/30/2022   CO2 24 06/30/2022   GLUCOSE 98 06/30/2022   BUN 18 06/30/2022   CREATININE 0.97 06/30/2022   CALCIUM 9.6 06/30/2022   EGFR  79 06/30/2022   GFRNONAA 49 (L) 02/20/2020   Lab Results  Component Value Date   CHOL 198 06/30/2022   HDL 56 06/30/2022   LDLCALC 121 (H) 06/30/2022   TRIG 120 06/30/2022   CHOLHDL 3.5 08/20/2017   No results found for: "TSH" No results found for: "HGBA1C" Lab Results  Component Value Date   WBC 10.8 12/20/2020   HGB 15.6 12/20/2020   HCT 46.5 12/20/2020   MCV 88 12/20/2020   PLT 312 12/20/2020   Lab Results  Component Value Date   ALT 24 06/30/2022   AST 57 (H) 06/30/2022   ALKPHOS 68 06/30/2022   BILITOT 0.4 06/30/2022   No results found for: "25OHVITD2", "25OHVITD3", "VD25OH"   Review of Systems  Constitutional:  Negative for chills and fever.  HENT:  Negative for drooling, ear discharge, ear pain, postnasal drip, rhinorrhea, sinus pressure, sinus pain and sore throat.   Respiratory:  Negative for cough, hemoptysis, chest tightness, shortness of breath and wheezing.   Cardiovascular:  Negative for chest pain, palpitations and leg swelling.  Gastrointestinal:  Negative for abdominal pain, blood in stool, constipation, diarrhea, heartburn and nausea.  Endocrine: Negative for polydipsia.  Genitourinary:  Negative for dysuria, frequency, hematuria and urgency.  Musculoskeletal:  Negative for arthralgias, back pain, myalgias and neck pain.  Skin:  Negative for rash.  Allergic/Immunologic: Negative for environmental allergies.  Neurological:  Negative for dizziness, focal weakness and headaches.  Hematological:  Does not bruise/bleed easily.  Psychiatric/Behavioral:  Negative for suicidal ideas. The patient is not nervous/anxious.     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 01/21/2022  Chronic obstructive pulmonary disease with acute exacerbation (HCC) 07/27/2017   Asthma, chronic, severe persistent, uncomplicated 08/18/2016   Seasonal allergic rhinitis due to pollen 08/18/2016    Bilateral carotid artery stenosis 04/03/2016   Bilateral hydrocele 11/22/2014   BPH with obstruction/lower urinary tract symptoms 10/05/2014   Epididymal cyst 10/05/2014   Familial multiple lipoprotein-type hyperlipidemia 07/14/2014   Laboratory animal allergy 07/14/2014   Acute arthropathy 07/14/2014   Routine general medical examination at a health care facility 07/14/2014   Asthma with exacerbation 07/14/2014   Essential (primary) hypertension 07/14/2014   Screening for depression 07/14/2014   Noncompliance w/medication treatment due to intermit use of medication 11/23/2013   Asthma, chronic 08/08/2013    Allergies  Allergen Reactions   Codeine Itching    Past Surgical History:  Procedure Laterality Date   ARTERY REPAIR     BACK SURGERY     CHOLECYSTECTOMY     COLONOSCOPY  2013   cleared   HERNIA REPAIR      Social History   Tobacco Use   Smoking status: Former    Current packs/day: 0.00    Average packs/day: 1 pack/day for 20.0 years (20.0 ttl pk-yrs)    Types: Cigarettes    Start date: 08/09/1963    Quit date: 08/09/1983    Years since quitting: 39.4    Passive exposure: Past   Smokeless tobacco: Never   Tobacco comments:    quit smoking over 40 years ago  Vaping Use   Vaping status: Never Used  Substance Use Topics   Alcohol use: Yes    Alcohol/week: 0.0 standard drinks of alcohol    Comment: once monthly   Drug use: No     Medication list has been reviewed and updated.  Current Meds  Medication Sig   albuterol (VENTOLIN HFA) 108 (90 Base) MCG/ACT inhaler INHALE 2 PUFFS BY MOUTH EVERY 6 HOURS AS NEEDED FOR SHORTNESS OF BREATH OR WHEEZING   Albuterol-Budesonide (AIRSUPRA) 90-80 MCG/ACT AERO Inhale 1 puff into the lungs every 6 (six) hours as needed (SOB, wheezing).   ALLERGY RELIEF 180 MG tablet TAKE 1 TABLET BY MOUTH EVERY DAY   amLODipine (NORVASC) 2.5 MG tablet TAKE 1 TABLET BY MOUTH EVERY DAY   aspirin 81 MG chewable tablet Chew 81 mg by mouth  daily.   ezetimibe (ZETIA) 10 MG tablet TAKE 1 TABLET BY MOUTH EVERY DAY   finasteride (PROSCAR) 5 MG tablet Take 1 tablet (5 mg total) by mouth daily.   Fluticasone Propionate, Inhal, (FLOVENT DISKUS) 50 MCG/ACT AEPB Inhale 1 Inhalation into the lungs every 12 (twelve) hours.   Fluticasone-Umeclidin-Vilant (TRELEGY ELLIPTA) 100-62.5-25 MCG/ACT AEPB Inhale 1 puff into the lungs daily.   ibuprofen (ADVIL) 800 MG tablet TAKE 1 TABLET BY MOUTH THREE TIMES DAILY AS NEEDED   ipratropium-albuterol (DUONEB) 0.5-2.5 (3) MG/3ML SOLN Take 3 mLs by nebulization every 6 (six) hours as needed.   levofloxacin (LEVAQUIN) 750 MG tablet Take 1 tablet (750 mg total) by mouth daily.   losartan-hydrochlorothiazide (HYZAAR) 100-25 MG tablet Take 1 tablet by mouth daily.   montelukast (SINGULAIR) 10 MG tablet TAKE 1 TABLET BY MOUTH EVERY DAY   NON FORMULARY cpap device   predniSONE (DELTASONE) 10 MG tablet Take 1 tablet (10 mg total) by mouth daily with breakfast. Taper 4,4,4,3,3,3,2,2,2,1,1,1   rosuvastatin (CRESTOR) 5 MG tablet TAKE 1 TABLET BY MOUTH EVERY DAY   Current Facility-Administered Medications for the 01/22/23 encounter (Office Visit) with Duanne Limerick, MD  Medication   ipratropium-albuterol (  DUONEB) 0.5-2.5 (3) MG/3ML nebulizer solution 3 mL       08/01/2022    8:35 AM 07/29/2022   10:54 AM 06/30/2022    1:23 PM 12/26/2021   10:45 AM  GAD 7 : Generalized Anxiety Score  Nervous, Anxious, on Edge 0 0 0 0  Control/stop worrying 0 0 0 0  Worry too much - different things 0 0 0 0  Trouble relaxing 0 0 0 0  Restless 0 0 0 0  Easily annoyed or irritable 0 0 0 0  Afraid - awful might happen 0 0 0 0  Total GAD 7 Score 0 0 0 0  Anxiety Difficulty Not difficult at all Not difficult at all Not difficult at all Not difficult at all       01/22/2023    1:37 PM 08/01/2022    8:35 AM 07/29/2022   10:54 AM  Depression screen PHQ 2/9  Decreased Interest 0 0 0  Down, Depressed, Hopeless 0 0 0  PHQ - 2  Score 0 0 0  Altered sleeping  0 0  Tired, decreased energy  0 0  Change in appetite  0 0  Feeling bad or failure about yourself   0 0  Trouble concentrating  0 0  Moving slowly or fidgety/restless  0 0  Suicidal thoughts  0 0  PHQ-9 Score  0 0  Difficult doing work/chores  Not difficult at all Not difficult at all    BP Readings from Last 3 Encounters:  01/22/23 122/68  11/25/22 135/70  08/05/22 110/60    Physical Exam Vitals and nursing note reviewed.  HENT:     Head: Normocephalic.     Right Ear: Tympanic membrane, ear canal and external ear normal.     Left Ear: Tympanic membrane, ear canal and external ear normal.     Nose: Nose normal. No congestion or rhinorrhea.     Mouth/Throat:     Mouth: Mucous membranes are moist.     Pharynx: Oropharynx is clear.  Eyes:     General: No scleral icterus.       Right eye: No discharge.        Left eye: No discharge.     Conjunctiva/sclera: Conjunctivae normal.     Pupils: Pupils are equal, round, and reactive to light.  Neck:     Thyroid: No thyromegaly.     Vascular: No JVD.     Trachea: No tracheal deviation.  Cardiovascular:     Rate and Rhythm: Normal rate and regular rhythm.     Heart sounds: Normal heart sounds. No murmur heard.    No friction rub. No gallop.  Pulmonary:     Effort: No respiratory distress.     Breath sounds: Normal breath sounds. No stridor. No wheezing, rhonchi or rales.  Chest:     Chest wall: No tenderness.  Abdominal:     General: Bowel sounds are normal.     Palpations: Abdomen is soft. There is no mass.     Tenderness: There is no abdominal tenderness. There is no guarding or rebound.  Musculoskeletal:        General: No tenderness. Normal range of motion.     Cervical back: Normal range of motion and neck supple.  Lymphadenopathy:     Cervical: No cervical adenopathy.  Skin:    General: Skin is warm.     Findings: No rash.  Neurological:     Mental Status: He is alert and oriented to  person, place, and time.     Cranial Nerves: No cranial nerve deficit.     Deep Tendon Reflexes: Reflexes are normal and symmetric.     Wt Readings from Last 3 Encounters:  01/22/23 183 lb (83 kg)  11/25/22 183 lb (83 kg)  08/05/22 174 lb (78.9 kg)    BP 122/68   Pulse 62   Resp 16   Ht 5\' 10"  (1.778 m)   Wt 183 lb (83 kg)   SpO2 99%   BMI 26.26 kg/m   Assessment and Plan:     Elizabeth Sauer, MD

## 2023-01-23 ENCOUNTER — Other Ambulatory Visit: Payer: Self-pay | Admitting: Urology

## 2023-01-23 ENCOUNTER — Encounter: Payer: Self-pay | Admitting: Family Medicine

## 2023-01-23 DIAGNOSIS — N401 Enlarged prostate with lower urinary tract symptoms: Secondary | ICD-10-CM

## 2023-01-23 LAB — COMPREHENSIVE METABOLIC PANEL
ALT: 24 [IU]/L (ref 0–44)
AST: 61 [IU]/L — ABNORMAL HIGH (ref 0–40)
Albumin: 4.7 g/dL (ref 3.7–4.7)
Alkaline Phosphatase: 68 [IU]/L (ref 44–121)
BUN/Creatinine Ratio: 16 (ref 10–24)
BUN: 16 mg/dL (ref 8–27)
Bilirubin Total: 0.6 mg/dL (ref 0.0–1.2)
CO2: 21 mmol/L (ref 20–29)
Calcium: 9.4 mg/dL (ref 8.6–10.2)
Chloride: 100 mmol/L (ref 96–106)
Creatinine, Ser: 1 mg/dL (ref 0.76–1.27)
Globulin, Total: 2.6 g/dL (ref 1.5–4.5)
Glucose: 92 mg/dL (ref 70–99)
Potassium: 3.9 mmol/L (ref 3.5–5.2)
Sodium: 138 mmol/L (ref 134–144)
Total Protein: 7.3 g/dL (ref 6.0–8.5)
eGFR: 76 mL/min/{1.73_m2} (ref >59)

## 2023-01-23 LAB — LIPID PANEL WITH LDL/HDL RATIO
Cholesterol, Total: 255 mg/dL — ABNORMAL HIGH (ref 100–199)
HDL: 56 mg/dL (ref >39)
LDL Chol Calc (NIH): 180 mg/dL — ABNORMAL HIGH (ref 0–99)
LDL/HDL Ratio: 3.2 ratio (ref 0.0–3.6)
Triglycerides: 107 mg/dL (ref 0–149)
VLDL Cholesterol Cal: 19 mg/dL (ref 5–40)

## 2023-03-09 ENCOUNTER — Ambulatory Visit: Payer: Medicare Other | Admitting: Family Medicine

## 2023-03-09 ENCOUNTER — Telehealth: Payer: Self-pay

## 2023-03-09 VITALS — BP 128/74 | HR 79 | Temp 97.9°F | Ht 70.0 in | Wt 180.0 lb

## 2023-03-09 DIAGNOSIS — J4521 Mild intermittent asthma with (acute) exacerbation: Secondary | ICD-10-CM | POA: Diagnosis not present

## 2023-03-09 MED ORDER — PREDNISONE 10 MG PO TABS: 10.0000 mg | ORAL_TABLET | Freq: Every day | ORAL | 0 refills | Status: DC

## 2023-03-09 MED ORDER — DOXYCYCLINE HYCLATE 100 MG PO TABS: 100.0000 mg | ORAL_TABLET | Freq: Two times a day (BID) | ORAL | 0 refills | Status: DC

## 2023-03-09 NOTE — Telephone Encounter (Signed)
Pt called had he is coughing, head call and no fever hasn't done covid test. Advised him to call pcp first they cannot see him then he can call us back

## 2023-03-09 NOTE — Progress Notes (Signed)
Date:  03/09/2023   Name:  Marcus Delacruz   DOB:  01/22/1941   MRN:  [id number]   Chief Complaint: Cough (Pt state he started on 12/26 with cold symptoms. Pt states he is coughing and spitting up mucus. Pt state his mucus is yellow. No fever.) and Nasal Congestion (Pt state)  Cough This is a new problem. The current episode started 1 to 4 weeks ago. The problem has been gradually improving. The problem occurs every few minutes. The cough is Productive of purulent sputum. Pertinent negatives include no chest pain, chills, ear congestion, ear pain, fever, headaches, heartburn, hemoptysis, myalgias, nasal congestion, postnasal drip, rash, rhinorrhea, sore throat, shortness of breath, sweats, weight loss or wheezing. The symptoms are aggravated by animals. He has tried nothing for the symptoms. The treatment provided moderate relief. His past medical history is significant for asthma. There is no history of bronchiectasis, bronchitis, COPD, emphysema, environmental allergies or pneumonia.    Lab Results  Component Value Date   NA 138 01/22/2023   K 3.9 01/22/2023   CO2 21 01/22/2023   GLUCOSE 92 01/22/2023   BUN 16 01/22/2023   CREATININE 1.00 01/22/2023   CALCIUM 9.4 01/22/2023   EGFR 76 01/22/2023   GFRNONAA 49 (L) 02/20/2020   Lab Results  Component Value Date   CHOL 255 (H) 01/22/2023   HDL 56 01/22/2023   LDLCALC 180 (H) 01/22/2023   TRIG 107 01/22/2023   CHOLHDL 3.5 08/20/2017   No results found for: "TSH" No results found for: "HGBA1C" Lab Results  Component Value Date   WBC 10.8 12/20/2020   HGB 15.6 12/20/2020   HCT 46.5 12/20/2020   MCV 88 12/20/2020   PLT 312 12/20/2020   Lab Results  Component Value Date   ALT 24 01/22/2023   AST 61 (H) 01/22/2023   ALKPHOS 68 01/22/2023   BILITOT 0.6 01/22/2023   No results found for: "25OHVITD2", "25OHVITD3", "VD25OH"   Review of Systems  Constitutional:  Negative for chills, fever and weight loss.  HENT:   Negative for ear pain, postnasal drip, rhinorrhea and sore throat.   Respiratory:  Positive for cough. Negative for hemoptysis, shortness of breath and wheezing.   Cardiovascular:  Negative for chest pain.  Gastrointestinal:  Negative for heartburn.  Musculoskeletal:  Negative for myalgias.  Skin:  Negative for rash.  Allergic/Immunologic: Negative for environmental allergies.  Neurological:  Negative for headaches.    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 01/21/2022   Chronic obstructive pulmonary disease with acute exacerbation (HCC) 07/27/2017   Asthma, chronic, severe persistent, uncomplicated 08/18/2016   Seasonal allergic rhinitis due to pollen 08/18/2016   Bilateral carotid artery stenosis 04/03/2016   Bilateral hydrocele 11/22/2014   BPH with obstruction/lower urinary tract symptoms 10/05/2014   Epididymal cyst 10/05/2014   Familial multiple lipoprotein-type hyperlipidemia 07/14/2014   Laboratory animal allergy 07/14/2014   Acute arthropathy 07/14/2014   Routine general medical examination at a health care facility 07/14/2014   Asthma with exacerbation 07/14/2014   Essential (primary) hypertension 07/14/2014   Screening for depression 07/14/2014   Noncompliance w/medication treatment due to intermit use of medication 11/23/2013   Asthma, chronic 08/08/2013    Allergies  Allergen Reactions   Codeine Itching    Past Surgical History:  Procedure Laterality Date   ARTERY REPAIR     BACK SURGERY     CHOLECYSTECTOMY  COLONOSCOPY  2013   cleared   HERNIA REPAIR      Social History   Tobacco Use   Smoking status: Former    Current packs/day: 0.00    Average packs/day: 1 pack/day for 20.0 years (20.0 ttl pk-yrs)    Types: Cigarettes    Start date: 08/09/1963    Quit date: 08/09/1983    Years since quitting: 39.6    Passive exposure: Past   Smokeless tobacco: Never    Tobacco comments:    quit smoking over 40 years ago  Vaping Use   Vaping status: Never Used  Substance Use Topics   Alcohol use: Yes    Alcohol/week: 0.0 standard drinks of alcohol    Comment: once monthly   Drug use: No     Medication list has been reviewed and updated.  Current Meds  Medication Sig   albuterol (VENTOLIN HFA) 108 (90 Base) MCG/ACT inhaler INHALE 2 PUFFS BY MOUTH EVERY 6 HOURS AS NEEDED FOR SHORTNESS OF BREATH OR WHEEZING   ALLERGY RELIEF 180 MG tablet TAKE 1 TABLET BY MOUTH EVERY DAY   amLODipine (NORVASC) 2.5 MG tablet TAKE 1 TABLET BY MOUTH EVERY DAY   aspirin 81 MG chewable tablet Chew 81 mg by mouth daily.   ezetimibe (ZETIA) 10 MG tablet TAKE 1 TABLET BY MOUTH EVERY DAY   finasteride (PROSCAR) 5 MG tablet Take 1 tablet (5 mg total) by mouth daily.   Fluticasone-Umeclidin-Vilant (TRELEGY ELLIPTA) 100-62.5-25 MCG/ACT AEPB Inhale 1 puff into the lungs daily.   ibuprofen (ADVIL) 800 MG tablet TAKE 1 TABLET BY MOUTH THREE TIMES DAILY AS NEEDED   ipratropium-albuterol (DUONEB) 0.5-2.5 (3) MG/3ML SOLN Take 3 mLs by nebulization every 6 (six) hours as needed.   losartan-hydrochlorothiazide (HYZAAR) 100-25 MG tablet Take 1 tablet by mouth daily.   montelukast (SINGULAIR) 10 MG tablet TAKE 1 TABLET BY MOUTH EVERY DAY   NON FORMULARY cpap device   predniSONE (DELTASONE) 10 MG tablet Take 1 tablet (10 mg total) by mouth daily with breakfast. Taper 4,4,4,3,3,3,2,2,2,1,1,1   rosuvastatin (CRESTOR) 5 MG tablet TAKE 1 TABLET BY MOUTH EVERY DAY   Current Facility-Administered Medications for the 03/09/23 encounter (Office Visit) with Duanne Limerick, MD  Medication   ipratropium-albuterol (DUONEB) 0.5-2.5 (3) MG/3ML nebulizer solution 3 mL       03/09/2023    1:44 PM 08/01/2022    8:35 AM 07/29/2022   10:54 AM 06/30/2022    1:23 PM  GAD 7 : Generalized Anxiety Score  Nervous, Anxious, on Edge 0 0 0 0  Control/stop worrying 0 0 0 0  Worry too much - different things 0 0  0 0  Trouble relaxing 0 0 0 0  Restless 0 0 0 0  Easily annoyed or irritable 0 0 0 0  Afraid - awful might happen 0 0 0 0  Total GAD 7 Score 0 0 0 0  Anxiety Difficulty Not difficult at all Not difficult at all Not difficult at all Not difficult at all       03/09/2023    1:43 PM 01/22/2023    1:37 PM 08/01/2022    8:35 AM  Depression screen PHQ 2/9  Decreased Interest 2 0 0  Down, Depressed, Hopeless 0 0 0  PHQ - 2 Score 2 0 0  Altered sleeping 2  0  Tired, decreased energy 2  0  Change in appetite 0  0  Feeling bad or failure about yourself  0  0  Trouble  concentrating 0  0  Moving slowly or fidgety/restless 0  0  Suicidal thoughts 0  0  PHQ-9 Score 6  0  Difficult doing work/chores Not difficult at all  Not difficult at all    BP Readings from Last 3 Encounters:  03/09/23 128/74  01/22/23 122/68  11/25/22 135/70    Physical Exam Vitals and nursing note reviewed.  HENT:     Head: Normocephalic.     Right Ear: External ear normal.     Left Ear: External ear normal.     Nose: Nose normal.  Eyes:     General: No scleral icterus.       Right eye: No discharge.        Left eye: No discharge.     Conjunctiva/sclera: Conjunctivae normal.     Pupils: Pupils are equal, round, and reactive to light.  Neck:     Thyroid: No thyromegaly.     Vascular: No JVD.     Trachea: No tracheal deviation.  Cardiovascular:     Rate and Rhythm: Normal rate and regular rhythm.     Heart sounds: Normal heart sounds. No murmur heard.    No friction rub. No gallop.  Pulmonary:     Effort: No respiratory distress.     Breath sounds: Normal breath sounds. No wheezing or rales.  Abdominal:     General: Bowel sounds are normal.     Palpations: Abdomen is soft. There is no mass.     Tenderness: There is no abdominal tenderness. There is no guarding or rebound.  Musculoskeletal:        General: No tenderness. Normal range of motion.     Cervical back: Normal range of motion and neck  supple.  Lymphadenopathy:     Cervical: No cervical adenopathy.  Skin:    General: Skin is warm.     Findings: No rash.  Neurological:     Mental Status: He is alert and oriented to person, place, and time.     Cranial Nerves: No cranial nerve deficit.     Deep Tendon Reflexes: Reflexes are normal and symmetric.     Wt Readings from Last 3 Encounters:  03/09/23 180 lb (81.6 kg)  01/22/23 183 lb (83 kg)  11/25/22 183 lb (83 kg)    BP 128/74   Pulse 79   Temp 97.9 F (36.6 C) (Oral)   Ht 5\' 10"  (1.778 m)   Wt 180 lb (81.6 kg)   SpO2 98%   BMI 25.83 kg/m   Assessment and Plan: 1. Mild intermittent asthma with acute exacerbation (Primary) Acute.  Persistent.  Intermittent.  Currently active with long-acting and short acting albuterol.  Will initiate prednisone 10 mg once a day in addition doxycycline 100 mg twice a day has been used with acute exacerbation and a bronchiolitis associated with this in the past has responded to this medical regimen.    Elizabeth Sauer, MD

## 2023-03-16 ENCOUNTER — Ambulatory Visit: Payer: Self-pay

## 2023-03-16 NOTE — Telephone Encounter (Signed)
 Chief Complaint: Cough and Congestion Follow-up  Symptoms: Productive cough, thick yellow sputum, congestion  Frequency: ongoing Pertinent Negatives: Patient denies fever, chest pain, SOB Disposition: [] ED /[] Urgent Care (no appt availability in office) / [] Appointment(In office/virtual)/ []  [facility]/ [] Home Care/ [] Refused Recommended Disposition /[]  Mobile Bus/ [x]  Follow-up with PCP Additional Notes: Patient stated he was seen on 03/09/23 by PCP and was given 2 Rx for prednisone  and doxycycline . Patient reports taking medication as directed and still having 2 doses of Doxycycline  left. Patient stated his symptoms have improved but he is still congested and coughing up thick yellow sputum and the thick sputum is also coming out of the nose. Patient states the sputum is thick like glue. Patient reports he was advised to contact PCP if symptoms did not improve this week. Care advice given and patient was advised I would forward message to PCP for additional recommendations. Patient verbalized understanding and stated if a new Rx is given please send to [facility] in [place].   Summary: Congestion/cough   Patient was seen in office on 12/30 for congestion/cough and was given predniSONE  (DELTASONE ) 10 MG tablet and doxycycline  (VIBRA -TABS) 100 MG tablet. Patient states that he is not feeling any better and would like a stronger antibiotic.     Reason for Disposition  [1] Taking antibiotic AND [2] symptoms are BETTER AND [3] no fever  Answer Assessment - Initial Assessment Questions 1. INFECTION: What infection is the antibiotic being given for?     Bronchitis  2. ANTIBIOTIC: What antibiotic are you taking How many times per day?     Doxycycline  100 MG BID  3. DURATION: When was the antibiotic started?     12/30 4. MAIN CONCERN OR SYMPTOM:  What is your main concern right now?     Congestion  5. BETTER-SAME-WORSE: Are you getting better, staying the same, or  getting worse compared to when you first started the antibiotics? If getting worse, ask: In what way?      Better  6. FEVER: Do you have a fever? If Yes, ask: What is your temperature, how was it measured, and when did it start?     No  7. SYMPTOMS: Are there any other symptoms you're concerned about? If Yes, ask: When did it start?     Productive cough yellow sputum and congestion  8. FOLLOW-UP APPOINTMENT: Do you have a follow-up appointment with your doctor?     No  Protocols used: Infection on Antibiotic Follow-up Call-[facility]

## 2023-03-19 ENCOUNTER — Ambulatory Visit (INDEPENDENT_AMBULATORY_CARE_PROVIDER_SITE_OTHER): Payer: Medicare Other | Admitting: Emergency Medicine

## 2023-03-19 VITALS — Ht 70.0 in | Wt 180.0 lb

## 2023-03-19 DIAGNOSIS — Z0000 Encounter for general adult medical examination without abnormal findings: Secondary | ICD-10-CM

## 2023-03-19 NOTE — Patient Instructions (Addendum)
 Marcus Delacruz , Thank you for taking time to come for your Medicare Wellness Visit. I appreciate your ongoing commitment to your health goals. Please review the following plan we discussed and let me know if I can assist you in the future.   Referrals/Orders/Follow-Ups/Clinician Recommendations: Get a tetanus shot at your earliest convenience.  This is a list of the screening recommended for you and due dates:  Health Maintenance  Topic Date Due   COVID-19 Vaccine (6 - 2024-25 season) 11/09/2022   Medicare Annual Wellness Visit  03/18/2024   Pneumonia Vaccine  Completed   Flu Shot  Completed   Zoster (Shingles) Vaccine  Completed   HPV Vaccine  Aged Out   DTaP/Tdap/Td vaccine  Discontinued    Advanced directives: (Copy Requested) Please bring a copy of your health care power of attorney and living will to the office to be added to your chart at your convenience.  Next Medicare Annual Wellness Visit scheduled for next year: Yes, 03/31/24 @ 10:00 am (video visit)

## 2023-03-19 NOTE — Progress Notes (Signed)
 Subjective:   Marcus Delacruz is a 83 y.o. male who presents for Medicare Annual/Subsequent preventive examination.  Interactive audio and video telecommunications were attempted between this provider and patient, however failed, due to patient having technical difficulties OR patient did not have access to video capability.  We continued and completed visit with audio only.   Visit Complete: Virtual I connected with  Marcus Delacruz on 03/19/23 by a audio enabled telemedicine application and verified that I am speaking with the correct person using two identifiers.  Patient Location: Home  Provider Location: Home Office  I discussed the limitations of evaluation and management by telemedicine. The patient expressed understanding and agreed to proceed.  Vital Signs: Because this visit was a virtual/telehealth visit, some criteria may be missing or patient reported. Any vitals not documented were not able to be obtained and vitals that have been documented are patient reported.  Patient Medicare AWV questionnaire was completed by the patient on 03/18/23; I have confirmed that all information answered by patient is correct and no changes since this date.  Cardiac Risk Factors include: advanced age (>69men, >8 women);male gender;hypertension;dyslipidemia;Other (see comment), Risk factor comments: OSA (cpap)     Objective:    Today's Vitals   03/19/23 0954  Weight: 180 lb (81.6 kg)  Height: 5' 10 (1.778 m)   Body mass index is 25.83 kg/m.     03/19/2023   10:12 AM 03/17/2022    3:55 PM 02/20/2021    1:30 PM 02/20/2020    1:50 PM 02/16/2019    2:04 PM 02/08/2018    2:43 PM 04/03/2016   11:15 AM  Advanced Directives  Does Patient Have a Medical Advance Directive? Yes Yes Yes Yes Yes Yes Yes  Type of Estate Agent of [place];Living will Healthcare Power of [place];Living will Healthcare Power of [place];Living will Healthcare Power of [place];Living  will Healthcare Power of [place];Living will Healthcare Power of [place];Living will Healthcare Power of [place];Living will  Does patient want to make changes to medical advance directive? No - Patient declined No - Patient declined       Copy of Healthcare Power of Attorney in Chart? No - copy requested No - copy requested No - copy requested No - copy requested No - copy requested No - copy requested     Current Medications (verified) Outpatient Encounter Medications as of 03/19/2023  Medication Sig   albuterol  (VENTOLIN  HFA) 108 (90 Base) MCG/ACT inhaler INHALE 2 PUFFS BY MOUTH EVERY 6 HOURS AS NEEDED FOR SHORTNESS OF BREATH OR WHEEZING   ALLERGY RELIEF 180 MG tablet TAKE 1 TABLET BY MOUTH EVERY DAY   amLODipine  (NORVASC ) 2.5 MG tablet TAKE 1 TABLET BY MOUTH EVERY DAY   aspirin  81 MG chewable tablet Chew 81 mg by mouth daily.   ezetimibe  (ZETIA ) 10 MG tablet TAKE 1 TABLET BY MOUTH EVERY DAY   finasteride  (PROSCAR ) 5 MG tablet Take 1 tablet (5 mg total) by mouth daily.   Fluticasone -Umeclidin-Vilant (TRELEGY ELLIPTA ) 100-62.5-25 MCG/ACT AEPB Inhale 1 puff into the lungs daily.   ibuprofen  (ADVIL ) 800 MG tablet TAKE 1 TABLET BY MOUTH THREE TIMES DAILY AS NEEDED   ipratropium-albuterol  (DUONEB) 0.5-2.5 (3) MG/3ML SOLN Take 3 mLs by nebulization every 6 (six) hours as needed.   losartan -hydrochlorothiazide  (HYZAAR) 100-25 MG tablet Take 1 tablet by mouth daily.   montelukast  (SINGULAIR ) 10 MG tablet TAKE 1 TABLET BY MOUTH EVERY DAY   NON FORMULARY cpap device   rosuvastatin  (CRESTOR ) 5 MG  tablet TAKE 1 TABLET BY MOUTH EVERY DAY   Albuterol -Budesonide (AIRSUPRA ) 90-80 MCG/ACT AERO Inhale 1 puff into the lungs every 6 (six) hours as needed (SOB, wheezing). (Patient not taking: Reported on 03/19/2023)   doxycycline  (VIBRA -TABS) 100 MG tablet Take 1 tablet (100 mg total) by mouth 2 (two) times daily. (Patient not taking: Reported on 03/19/2023)   Fluticasone  Propionate, Inhal, (FLOVENT  DISKUS) 50  MCG/ACT AEPB Inhale 1 Inhalation into the lungs every 12 (twelve) hours. (Patient not taking: Reported on 03/19/2023)   levofloxacin  (LEVAQUIN ) 750 MG tablet Take 1 tablet (750 mg total) by mouth daily. (Patient not taking: Reported on 03/19/2023)   predniSONE  (DELTASONE ) 10 MG tablet Take 1 tablet (10 mg total) by mouth daily with breakfast. (Patient not taking: Reported on 03/19/2023)   Facility-Administered Encounter Medications as of 03/19/2023  Medication   ipratropium-albuterol  (DUONEB) 0.5-2.5 (3) MG/3ML nebulizer solution 3 mL    Allergies (verified) Codeine    History: Past Medical History:  Diagnosis Date   Allergic rhinitis due to pollen    Asthma    BPH (benign prostatic hyperplasia)    Cardiomegaly    Chronic obstructive pulmonary disease (COPD) (HCC)    Hypercholesteremia    Hypersomnia, unspecified    Hypertension    Obstructive sleep apnea (adult) (pediatric)    Shortness of breath    Snoring    Solitary pulmonary nodule    Past Surgical History:  Procedure Laterality Date   ARTERY REPAIR     BACK SURGERY     CHOLECYSTECTOMY     COLONOSCOPY  2013   cleared   HERNIA REPAIR     Family History  Problem Relation Age of Onset   COPD Mother    Cancer Father        lung   Asthma Son    Prostate cancer Neg Hx    Kidney disease Neg Hx    Social History   Socioeconomic History   Marital status: Married    Spouse name: Garment/textile Technologist   Number of children: 3   Years of education: Not on file   Highest education level: Associate degree: occupational, scientist, product/process development, or vocational program  Occupational History   Occupation: retired  Tobacco Use   Smoking status: Former    Current packs/day: 0.00    Average packs/day: 1 pack/day for 20.0 years (20.0 ttl pk-yrs)    Types: Cigarettes    Start date: 08/09/1963    Quit date: 08/09/1983    Years since quitting: 39.6    Passive exposure: Past   Smokeless tobacco: Never   Tobacco comments:    quit smoking over 40 years ago  Vaping  Use   Vaping status: Never Used  Substance and Sexual Activity   Alcohol use: Yes    Comment: 1-2 beers monthly or less   Drug use: No   Sexual activity: Not Currently  Other Topics Concern   Not on file  Social History Narrative   Not on file   Social Drivers of Health   Financial Resource Strain: Low Risk  (03/19/2023)   Overall Financial Resource Strain (CARDIA)    Difficulty of Paying Living Expenses: Not hard at all  Food Insecurity: No Food Insecurity (03/19/2023)   Hunger Vital Sign    Worried About Running Out of Food in the Last Year: Never true    Ran Out of Food in the Last Year: Never true  Transportation Needs: No Transportation Needs (03/19/2023)   PRAPARE - Transportation    Lack of  Transportation (Medical): No    Lack of Transportation (Non-Medical): No  Physical Activity: Inactive (03/19/2023)   Exercise Vital Sign    Days of Exercise per Week: 0 days    Minutes of Exercise per Session: 0 min  Stress: No Stress Concern Present (03/19/2023)   [employer] of Occupational Health - Occupational Stress Questionnaire    Feeling of Stress : Not at all  Social Connections: Moderately Integrated (03/19/2023)   Social Connection and Isolation Panel [NHANES]    Frequency of Communication with Friends and Family: More than three times a week    Frequency of Social Gatherings with Friends and Family: Once a week    Attends Religious Services: 1 to 4 times per year    Active Member of [employer] or Organizations: No    Attends Engineer, Structural: Never    Marital Status: Married    Tobacco Counseling Counseling given: Not Answered Tobacco comments: quit smoking over 40 years ago   Clinical Intake:  Pre-visit preparation completed: Yes  Pain : No/denies pain     BMI - recorded: 25.83 Nutritional Status: BMI 25 -29 Overweight Nutritional Risks: None Diabetes: No  How often do you need to have someone help you when you read instructions, pamphlets, or other  written materials from your doctor or pharmacy?: 1 - Never  Interpreter Needed?: No  Information entered by :: Vina Ned, CMA   Activities of Daily Living    03/19/2023    9:57 AM 03/18/2023    8:14 AM  In your present state of health, do you have any difficulty performing the following activities:  Hearing? 0 0  Vision? 0 0  Difficulty concentrating or making decisions? 0 0  Walking or climbing stairs? 0 0  Dressing or bathing? 0 0  Doing errands, shopping? 0 0  Preparing Food and eating ? N Y  Using the Toilet? N N  In the past six months, have you accidently leaked urine? N N  Do you have problems with loss of bowel control? N N  Managing your Medications? N N  Managing your Finances? N N  Housekeeping or managing your Housekeeping? N N    Patient Care Team: Joshua Cathryne BROCKS, MD as PCP - General (Family Medicine) Marea, Selinda RAMAN, MD as Referring Physician (Vascular Surgery) Cathlyn Seal, MD as Referring Physician (Dermatology) Liana Fish, NP as Nurse Practitioner (Sleep Medicine) Sheldon Standing, MD as Consulting Physician (General Surgery) Rosalie Kitchens, MD as Consulting Physician (Gastroenterology)  Indicate any recent Medical Services you may have received from other than [facility] providers in the past year (date may be approximate).     Assessment:   This is a routine wellness examination for Daiquan.  Hearing/Vision screen Hearing Screening - Comments:: No hearing loss Vision Screening - Comments:: Gets eye exams   Goals Addressed               This Visit's Progress     Patient Stated (pt-stated)        Improve golf game      Depression Screen    03/19/2023   10:09 AM 03/09/2023    1:43 PM 01/22/2023    1:37 PM 08/01/2022    8:35 AM 07/29/2022   10:54 AM 06/30/2022    1:23 PM 03/17/2022    3:38 PM  PHQ 2/9 Scores  PHQ - 2 Score 0 2 0 0 0 0 0  PHQ- 9 Score 0 6  0 0 0  Fall Risk    03/19/2023   10:13 AM 03/18/2023    8:14 AM 03/09/2023     1:43 PM 01/22/2023    1:37 PM 08/01/2022    8:35 AM  Fall Risk   Falls in the past year? 0 0 0 0 0  Number falls in past yr: 0  0 0 0  Injury with Fall? 0  0 0 0  Risk for fall due to : No Fall Risks  No Fall Risks  No Fall Risks  Follow up Falls prevention discussed  Falls evaluation completed  Falls evaluation completed    MEDICARE RISK AT HOME: Medicare Risk at Home Any stairs in or around the home?: (Patient-Rptd) Yes If so, are there any without handrails?: (Patient-Rptd) No Home free of loose throw rugs in walkways, pet beds, electrical cords, etc?: Yes Adequate lighting in your home to reduce risk of falls?: (Patient-Rptd) Yes Life alert?: (Patient-Rptd) No Use of a cane, walker or w/c?: (Patient-Rptd) No Grab bars in the bathroom?: (Patient-Rptd) No Shower chair or bench in shower?: (Patient-Rptd) No Elevated toilet seat or a handicapped toilet?: (Patient-Rptd) No  TIMED UP AND GO:  Was the test performed?  No    Cognitive Function:        03/19/2023   10:14 AM 03/17/2022    3:39 PM 02/20/2020    1:40 PM  6CIT Screen  What Year? 0 points 0 points 0 points  What month? 0 points 0 points 0 points  What time? 0 points 0 points 0 points  Count back from 20 0 points 0 points 0 points  Months in reverse 0 points 0 points 0 points  Repeat phrase 0 points 2 points 0 points  Total Score 0 points 2 points 0 points    Immunizations Immunization History  Administered Date(s) Administered   Influenza Split 12/08/2012   Influenza, High Dose Seasonal PF 12/07/2017, 11/30/2019   Influenza,inj,Quad PF,6+ Mos 01/06/2013, 11/23/2013   Influenza,inj,quad, With Preservative 12/24/2016, 11/25/2018   Influenza-Unspecified 01/21/2017, 12/04/2020, 12/18/2021   Moderna Sars-Covid-2 Vaccination 12/24/2020   PFIZER(Purple Top)SARS-COV-2 Vaccination 03/16/2019, 04/06/2019, 12/02/2019, 06/30/2020   Pneumococcal Conjugate-13 01/17/2015   Pneumococcal Polysaccharide-23 06/27/2010   Tdap  10/05/2011   Zoster Recombinant(Shingrix) 02/06/2018, 04/19/2018    TDAP status: Due, Education has been provided regarding the importance of this vaccine. Advised may receive this vaccine at local pharmacy or Health Dept. Aware to provide a copy of the vaccination record if obtained from local pharmacy or Health Dept. Verbalized acceptance and understanding.  Flu Vaccine status: Up to date  Pneumococcal vaccine status: Up to date  Covid-19 vaccine status: Declined, Education has been provided regarding the importance of this vaccine but patient still declined. Advised may receive this vaccine at local pharmacy or Health Dept.or vaccine clinic. Aware to provide a copy of the vaccination record if obtained from local pharmacy or Health Dept. Verbalized acceptance and understanding.  Qualifies for Shingles Vaccine? Yes   Zostavax completed No   Shingrix Completed?: Yes  Screening Tests Health Maintenance  Topic Date Due   COVID-19 Vaccine (6 - 2024-25 season) 11/09/2022   Medicare Annual Wellness (AWV)  03/18/2024   Pneumonia Vaccine 39+ Years old  Completed   INFLUENZA VACCINE  Completed   Zoster Vaccines- Shingrix  Completed   HPV VACCINES  Aged Out   DTaP/Tdap/Td  Discontinued    Health Maintenance  Health Maintenance Due  Topic Date Due   COVID-19 Vaccine (6 - 2024-25 season) 11/09/2022  Colorectal cancer screening: No longer required.   Lung Cancer Screening: (Low Dose CT Chest recommended if Age 79-80 years, 20 pack-year currently smoking OR have quit w/in 15years.) does not qualify.   Lung Cancer Screening Referral: n/a  Additional Screening:  Hepatitis C Screening: does not qualify  Vision Screening: Recommended annual ophthalmology exams for early detection of glaucoma and other disorders of the eye. Dental Screening: Recommended annual dental exams for proper oral hygiene  Community Resource Referral / Chronic Care Management: CRR required this visit?  No    CCM required this visit?  No     Plan:     I have personally reviewed and noted the following in the patient's chart:   Medical and social history Use of alcohol, tobacco or illicit drugs  Current medications and supplements including opioid prescriptions. Patient is not currently taking opioid prescriptions. Functional ability and status Nutritional status Physical activity Advanced directives List of other physicians Hospitalizations, surgeries, and ER visits in previous 12 months Vitals Screenings to include cognitive, depression, and falls Referrals and appointments  In addition, I have reviewed and discussed with patient certain preventive protocols, quality metrics, and best practice recommendations. A written personalized care plan for preventive services as well as general preventive health recommendations were provided to patient.     Vina Ned, CMA   03/19/2023   After Visit Summary: (MyChart) Due to this being a telephonic visit, the after visit summary with patients personalized plan was offered to patient via MyChart   Nurse Notes:  Needs Tdap vaccine Declined covid vaccine

## 2023-03-23 ENCOUNTER — Ambulatory Visit: Payer: Medicare Other | Admitting: Family Medicine

## 2023-03-24 ENCOUNTER — Other Ambulatory Visit (INDEPENDENT_AMBULATORY_CARE_PROVIDER_SITE_OTHER): Payer: Self-pay | Admitting: Nurse Practitioner

## 2023-03-24 DIAGNOSIS — I6523 Occlusion and stenosis of bilateral carotid arteries: Secondary | ICD-10-CM

## 2023-03-27 ENCOUNTER — Ambulatory Visit (INDEPENDENT_AMBULATORY_CARE_PROVIDER_SITE_OTHER): Payer: Medicare Other

## 2023-03-27 ENCOUNTER — Ambulatory Visit (INDEPENDENT_AMBULATORY_CARE_PROVIDER_SITE_OTHER): Payer: Medicare Other | Admitting: Vascular Surgery

## 2023-03-27 ENCOUNTER — Encounter (INDEPENDENT_AMBULATORY_CARE_PROVIDER_SITE_OTHER): Payer: Self-pay | Admitting: Vascular Surgery

## 2023-03-27 VITALS — BP 135/68 | HR 67 | Resp 16 | Wt 185.8 lb

## 2023-03-27 DIAGNOSIS — I10 Essential (primary) hypertension: Secondary | ICD-10-CM

## 2023-03-27 DIAGNOSIS — I6523 Occlusion and stenosis of bilateral carotid arteries: Secondary | ICD-10-CM

## 2023-03-27 NOTE — Progress Notes (Signed)
MRN : [id number]  Marcus Delacruz is a 83 y.o. (08/14/1940) male who presents with chief complaint of  Chief Complaint  Patient presents with   Follow-up    63yr carotid ultrasound follow up  .  History of Present Illness: Patient returns in follow-up of his carotid disease.  He is many years status post right carotid endarterectomy for symptomatic stenosis with previous TIA.  He is doing well.  He denies any focal neurologic symptoms or issues since his last visit.  His carotid duplex today shows a widely patent right carotid endarterectomy without recurrent stenosis.  His left carotid stenosis now is in the 60 to 79% range with increase in velocity since his last visit.  Current Outpatient Medications  Medication Sig Dispense Refill   albuterol (VENTOLIN HFA) 108 (90 Base) MCG/ACT inhaler INHALE 2 PUFFS BY MOUTH EVERY 6 HOURS AS NEEDED FOR SHORTNESS OF BREATH OR WHEEZING 18 g 1   ALLERGY RELIEF 180 MG tablet TAKE 1 TABLET BY MOUTH EVERY DAY 90 tablet 1   amLODipine (NORVASC) 2.5 MG tablet TAKE 1 TABLET BY MOUTH EVERY DAY 90 tablet 1   aspirin 81 MG chewable tablet Chew 81 mg by mouth daily.     ezetimibe (ZETIA) 10 MG tablet TAKE 1 TABLET BY MOUTH EVERY DAY 90 tablet 1   finasteride (PROSCAR) 5 MG tablet Take 1 tablet (5 mg total) by mouth daily. 90 tablet 3   Fluticasone-Umeclidin-Vilant (TRELEGY ELLIPTA) 100-62.5-25 MCG/ACT AEPB Inhale 1 puff into the lungs daily. 180 each 3   ibuprofen (ADVIL) 800 MG tablet TAKE 1 TABLET BY MOUTH THREE TIMES DAILY AS NEEDED 180 tablet 0   ipratropium-albuterol (DUONEB) 0.5-2.5 (3) MG/3ML SOLN Take 3 mLs by nebulization every 6 (six) hours as needed. 360 mL 1   losartan-hydrochlorothiazide (HYZAAR) 100-25 MG tablet Take 1 tablet by mouth daily. 90 tablet 1   montelukast (SINGULAIR) 10 MG tablet TAKE 1 TABLET BY MOUTH EVERY DAY 90 tablet 1   NON FORMULARY cpap device     rosuvastatin (CRESTOR) 5 MG tablet TAKE 1 TABLET BY MOUTH EVERY DAY 90 tablet  1   Albuterol-Budesonide (AIRSUPRA) 90-80 MCG/ACT AERO Inhale 1 puff into the lungs every 6 (six) hours as needed (SOB, wheezing). (Patient not taking: Reported on 03/27/2023) 10.7 g 5   doxycycline (VIBRA-TABS) 100 MG tablet Take 1 tablet (100 mg total) by mouth 2 (two) times daily. (Patient not taking: Reported on 03/27/2023) 20 tablet 0   Fluticasone Propionate, Inhal, (FLOVENT DISKUS) 50 MCG/ACT AEPB Inhale 1 Inhalation into the lungs every 12 (twelve) hours. (Patient not taking: Reported on 03/27/2023) 60 each 0   levofloxacin (LEVAQUIN) 750 MG tablet Take 1 tablet (750 mg total) by mouth daily. (Patient not taking: Reported on 03/27/2023) 7 tablet 0   predniSONE (DELTASONE) 10 MG tablet Take 1 tablet (10 mg total) by mouth daily with breakfast. (Patient not taking: Reported on 03/27/2023) 30 tablet 0   Current Facility-Administered Medications  Medication Dose Route Frequency Provider Last Rate Last Admin   ipratropium-albuterol (DUONEB) 0.5-2.5 (3) MG/3ML nebulizer solution 3 mL  3 mL Nebulization Once Duanne Limerick, MD        Past Medical History:  Diagnosis Date   Allergic rhinitis due to pollen    Asthma    BPH (benign prostatic hyperplasia)    Cardiomegaly    Chronic obstructive pulmonary disease (COPD) (HCC)    Hypercholesteremia    Hypersomnia, unspecified    Hypertension  Obstructive sleep apnea (adult) (pediatric)    Shortness of breath    Snoring    Solitary pulmonary nodule     Past Surgical History:  Procedure Laterality Date   ARTERY REPAIR     BACK SURGERY     CHOLECYSTECTOMY     COLONOSCOPY  2013   cleared   HERNIA REPAIR       Social History   Tobacco Use   Smoking status: Former    Current packs/day: 0.00    Average packs/day: 1 pack/day for 20.0 years (20.0 ttl pk-yrs)    Types: Cigarettes    Start date: 08/09/1963    Quit date: 08/09/1983    Years since quitting: 39.6    Passive exposure: Past   Smokeless tobacco: Never   Tobacco comments:     quit smoking over 40 years ago  Vaping Use   Vaping status: Never Used  Substance Use Topics   Alcohol use: Yes    Comment: 1-2 beers monthly or less   Drug use: No       Family History  Problem Relation Age of Onset   COPD Mother    Cancer Father        lung   Asthma Son    Prostate cancer Neg Hx    Kidney disease Neg Hx      Allergies  Allergen Reactions   Codeine Itching     REVIEW OF SYSTEMS (Negative unless checked)  Constitutional: [] Weight loss  [] Fever  [] Chills Cardiac: [] Chest pain   [] Chest pressure   [] Palpitations   [] Shortness of breath when laying flat   [] Shortness of breath at rest   [] Shortness of breath with exertion. Vascular:  [] Pain in legs with walking   [] Pain in legs at rest   [] Pain in legs when laying flat   [] Claudication   [] Pain in feet when walking  [] Pain in feet at rest  [] Pain in feet when laying flat   [] History of DVT   [] Phlebitis   [] Swelling in legs   [] Varicose veins   [] Non-healing ulcers Pulmonary:   [] Uses home oxygen   [] Productive cough   [] Hemoptysis   [] Wheeze  [] COPD   [] Asthma Neurologic:  [] Dizziness  [] Blackouts   [] Seizures   [] History of stroke   [x] History of TIA  [] Aphasia   [] Temporary blindness   [] Dysphagia   [] Weakness or numbness in arms   [] Weakness or numbness in legs Musculoskeletal:  [x] Arthritis   [] Joint swelling   [] Joint pain   [] Low back pain Hematologic:  [] Easy bruising  [] Easy bleeding   [] Hypercoagulable state   [] Anemic  [] Hepatitis Gastrointestinal:  [] Blood in stool   [] Vomiting blood  [] Gastroesophageal reflux/heartburn   [] Difficulty swallowing. Genitourinary:  [] Chronic kidney disease   [] Difficult urination  [] Frequent urination  [] Burning with urination   [] Blood in urine Skin:  [] Rashes   [] Ulcers   [] Wounds Psychological:  [] History of anxiety   []  History of major depression.  Physical Examination  Vitals:   03/27/23 1001  BP: 135/68  Pulse: 67  Resp: 16  Weight: 185 lb 12.8 oz (84.3  kg)   Body mass index is 26.66 kg/m. Gen:  WD/WN, NAD. Appears younger than stated age. Head: [place]/AT, No temporalis wasting. Ear/Nose/Throat: Hearing grossly intact, nares w/o erythema or drainage, trachea midline Eyes: Conjunctiva clear. Sclera non-icteric Neck: Supple.  Soft bilateral carotid bruit  Pulmonary:  Good air movement, equal and clear to auscultation bilaterally.  Cardiac: RRR, No JVD Vascular:  Vessel  Right Left  Radial Palpable Palpable               Musculoskeletal: M/S 5/5 throughout.  No deformity or atrophy. No edema. Neurologic: CN 2-12 intact. Sensation grossly intact in extremities.  Symmetrical.  Speech is fluent. Motor exam as listed above. Psychiatric: Judgment intact, Mood & affect appropriate for pt's clinical situation. Dermatologic: No rashes or ulcers noted.  No cellulitis or open wounds.     CBC Lab Results  Component Value Date   WBC 10.8 12/20/2020   HGB 15.6 12/20/2020   HCT 46.5 12/20/2020   MCV 88 12/20/2020   PLT 312 12/20/2020    BMET    Component Value Date/Time   NA 138 01/22/2023 1424   K 3.9 01/22/2023 1424   CL 100 01/22/2023 1424   CO2 21 01/22/2023 1424   GLUCOSE 92 01/22/2023 1424   BUN 16 01/22/2023 1424   CREATININE 1.00 01/22/2023 1424   CALCIUM 9.4 01/22/2023 1424   GFRNONAA 49 (L) 02/20/2020 1639   GFRAA 56 (L) 02/20/2020 1639   CrCl cannot be calculated (Patient's most recent lab result is older than the maximum 21 days allowed.).  COAG No results found for: "INR", "PROTIME"  Radiology No results found.   Assessment/Plan Essential (primary) hypertension blood pressure control important in reducing the progression of atherosclerotic disease. On appropriate oral medications.   Bilateral carotid artery stenosis His carotid duplex today shows a widely patent right carotid endarterectomy without recurrent stenosis.  His left carotid stenosis now is in the 60 to 79% range with increase in velocity since his  last visit.  He is going to continue his aspirin and statin agent.  I am going to shorten his follow-up and do a 69-month check rather than a year with the increased velocities at this point.  He will contact our office or seek immediate medical attention with any symptoms of cerebrovascular ischemia.    Festus Barren, MD  03/27/2023 10:32 AM    This note was created with Dragon medical transcription system.  Any errors from dictation are purely unintentional

## 2023-03-27 NOTE — Assessment & Plan Note (Signed)
His carotid duplex today shows a widely patent right carotid endarterectomy without recurrent stenosis.  His left carotid stenosis now is in the 60 to 79% range with increase in velocity since his last visit.  He is going to continue his aspirin and statin agent.  I am going to shorten his follow-up and do a 3-month check rather than a year with the increased velocities at this point.  He will contact our office or seek immediate medical attention with any symptoms of cerebrovascular ischemia.

## 2023-03-27 NOTE — Assessment & Plan Note (Signed)
blood pressure control important in reducing the progression of atherosclerotic disease. On appropriate oral medications.  

## 2023-04-19 ENCOUNTER — Other Ambulatory Visit: Payer: Self-pay | Admitting: Family Medicine

## 2023-04-19 DIAGNOSIS — E7849 Other hyperlipidemia: Secondary | ICD-10-CM

## 2023-04-20 ENCOUNTER — Encounter: Payer: Self-pay | Admitting: Nurse Practitioner

## 2023-04-20 ENCOUNTER — Ambulatory Visit: Payer: Medicare Other | Admitting: Nurse Practitioner

## 2023-04-20 VITALS — BP 128/64 | HR 66 | Temp 98.3°F | Resp 16 | Ht 70.0 in | Wt 185.4 lb

## 2023-04-20 DIAGNOSIS — Z7189 Other specified counseling: Secondary | ICD-10-CM

## 2023-04-20 DIAGNOSIS — J449 Chronic obstructive pulmonary disease, unspecified: Secondary | ICD-10-CM

## 2023-04-20 DIAGNOSIS — R0602 Shortness of breath: Secondary | ICD-10-CM

## 2023-04-20 DIAGNOSIS — G4733 Obstructive sleep apnea (adult) (pediatric): Secondary | ICD-10-CM

## 2023-04-20 MED ORDER — FLUTICASONE-UMECLIDIN-VILANT 100-62.5-25 MCG/ACT IN AEPB: 1.0000 | INHALATION_SPRAY | Freq: Every day | RESPIRATORY_TRACT | 3 refills | Status: AC

## 2023-04-20 NOTE — Progress Notes (Signed)
 [facility] [address] [place], [place] [address]  Internal MEDICINE  Office Visit Note  Patient Name: Marcus Delacruz  [id number]  [id number]  Date of Service: 04/20/2023  Chief Complaint  Patient presents with   Follow-up    HPI Marcus Delacruz presents for a follow up visit for OSA, CPAP use and to review PFT.  COPD -- no significant changes. Still using trelegy inhaler. Due for annual PFT.  OSA with CPAP use- still sleeping well, the days not used on this cpap he uses his old one out of town. No problems or questions at this time. low leak and low events per hour  CPAP download: Set pressure 11 cmH2O 95th percentile leak 1.0 L/min apnea index 1.0 events/hr Hypopnea index 1.3 events/hr apnea-hypopnea index  2.3 events/hr total days used  >4 hr 23 out of 30 days total days used <4 hr 0 days Average usage per night 7 hours and 58 min Total compliance 77% but 100% with use of old CPAP machine .       Current Medication: Outpatient Encounter Medications as of 04/20/2023  Medication Sig Note   albuterol (VENTOLIN HFA) 108 (90 Base) MCG/ACT inhaler INHALE 2 PUFFS BY MOUTH EVERY 6 HOURS AS NEEDED FOR SHORTNESS OF BREATH OR WHEEZING    ALLERGY RELIEF 180 MG tablet TAKE 1 TABLET BY MOUTH EVERY DAY    amLODipine (NORVASC) 2.5 MG tablet TAKE 1 TABLET BY MOUTH EVERY DAY    aspirin 81 MG chewable tablet TAKE 1 TABLET BY MOUTH ONCE DAILY    doxycycline (VIBRA-TABS) 100 MG tablet Take 1 tablet (100 mg total) by mouth 2 (two) times daily.    ezetimibe (ZETIA) 10 MG tablet TAKE 1 TABLET BY MOUTH EVERY DAY    finasteride (PROSCAR) 5 MG tablet Take 1 tablet (5 mg total) by mouth daily.    ibuprofen (ADVIL) 800 MG tablet TAKE 1 TABLET BY MOUTH THREE TIMES DAILY AS NEEDED    ipratropium-albuterol (DUONEB) 0.5-2.5 (3) MG/3ML SOLN Take 3 mLs by nebulization every 6 (six) hours as needed.    losartan-hydrochlorothiazide (HYZAAR) 100-25 MG tablet Take 1 tablet by mouth daily.     montelukast (SINGULAIR) 10 MG tablet TAKE 1 TABLET BY MOUTH EVERY DAY 03/19/2023: prn   NON FORMULARY cpap device    rosuvastatin (CRESTOR) 5 MG tablet TAKE 1 TABLET BY MOUTH EVERY DAY    [DISCONTINUED] Albuterol-Budesonide (AIRSUPRA) 90-80 MCG/ACT AERO Inhale 1 puff into the lungs every 6 (six) hours as needed (SOB, wheezing).    [DISCONTINUED] Fluticasone Propionate, Inhal, (FLOVENT DISKUS) 50 MCG/ACT AEPB Inhale 1 Inhalation into the lungs every 12 (twelve) hours.    [DISCONTINUED] Fluticasone-Umeclidin-Vilant (TRELEGY ELLIPTA) 100-62.5-25 MCG/ACT AEPB Inhale 1 puff into the lungs daily.    [DISCONTINUED] levofloxacin (LEVAQUIN) 750 MG tablet Take 1 tablet (750 mg total) by mouth daily.    [DISCONTINUED] predniSONE (DELTASONE) 10 MG tablet Take 1 tablet (10 mg total) by mouth daily with breakfast.    Fluticasone-Umeclidin-Vilant (TRELEGY ELLIPTA) 100-62.5-25 MCG/ACT AEPB Inhale 1 puff into the lungs daily.    [DISCONTINUED] ipratropium-albuterol (DUONEB) 0.5-2.5 (3) MG/3ML nebulizer solution 3 mL     No facility-administered encounter medications on file as of 04/20/2023.    Surgical History: Past Surgical History:  Procedure Laterality Date   ARTERY REPAIR     BACK SURGERY     CHOLECYSTECTOMY     COLONOSCOPY  2013   cleared   HERNIA REPAIR      Medical History: Past Medical  History:  Diagnosis Date   Allergic rhinitis due to pollen    Asthma    BPH (benign prostatic hyperplasia)    Cardiomegaly    Chronic obstructive pulmonary disease (COPD) (HCC)    Hypercholesteremia    Hypersomnia, unspecified    Hypertension    Obstructive sleep apnea (adult) (pediatric)    Shortness of breath    Snoring    Solitary pulmonary nodule     Family History: Family History  Problem Relation Age of Onset   COPD Mother    Cancer Father        lung   Asthma Son    Prostate cancer Neg Hx    Kidney disease Neg Hx     Social History   Socioeconomic History   Marital status: Married     Spouse name: Garment/textile technologist   Number of children: 3   Years of education: Not on file   Highest education level: Associate degree: occupational, Scientist, product/process development, or vocational program  Occupational History   Occupation: retired  Tobacco Use   Smoking status: Former    Current packs/day: 0.00    Average packs/day: 1 pack/day for 20.0 years (20.0 ttl pk-yrs)    Types: Cigarettes    Start date: 08/09/1963    Quit date: 08/09/1983    Years since quitting: 39.7    Passive exposure: Past   Smokeless tobacco: Never   Tobacco comments:    quit smoking over 40 years ago  Vaping Use   Vaping status: Never Used  Substance and Sexual Activity   Alcohol use: Yes    Comment: 1-2 beers monthly or less   Drug use: No   Sexual activity: Not Currently  Other Topics Concern   Not on file  Social History Narrative   Not on file   Social Drivers of Health   Financial Resource Strain: Low Risk  (03/19/2023)   Overall Financial Resource Strain (CARDIA)    Difficulty of Paying Living Expenses: Not hard at all  Food Insecurity: No Food Insecurity (03/19/2023)   Hunger Vital Sign    Worried About Running Out of Food in the Last Year: Never true    Ran Out of Food in the Last Year: Never true  Transportation Needs: No Transportation Needs (03/19/2023)   PRAPARE - Administrator, Civil Service (Medical): No    Lack of Transportation (Non-Medical): No  Physical Activity: Inactive (03/19/2023)   Exercise Vital Sign    Days of Exercise per Week: 0 days    Minutes of Exercise per Session: 0 min  Stress: No Stress Concern Present (03/19/2023)   [employer] of Occupational Health - Occupational Stress Questionnaire    Feeling of Stress : Not at all  Social Connections: Moderately Integrated (03/19/2023)   Social Connection and Isolation Panel [NHANES]    Frequency of Communication with Friends and Family: More than three times a week    Frequency of Social Gatherings with Friends and Family: Once a week     Attends Religious Services: 1 to 4 times per year    Active Member of [employer] or Organizations: No    Attends Banker Meetings: Never    Marital Status: Married  Catering manager Violence: Not At Risk (03/19/2023)   Humiliation, Afraid, Rape, and Kick questionnaire    Fear of Current or Ex-Partner: No    Emotionally Abused: No    Physically Abused: No    Sexually Abused: No      Review of  Systems  Constitutional:  Positive for fatigue. Negative for chills and unexpected weight change.  HENT:  Negative for congestion, postnasal drip, rhinorrhea, sneezing and sore throat.   Eyes:  Negative for redness.  Respiratory:  Positive for cough, chest tightness, shortness of breath and wheezing.        Symptoms are intermittent  Cardiovascular: Negative.  Negative for chest pain and palpitations.  Gastrointestinal:  Negative for abdominal pain, constipation, diarrhea, nausea and vomiting.  Genitourinary:  Negative for dysuria and frequency.  Musculoskeletal:  Negative for arthralgias, back pain, joint swelling and neck pain.  Skin:  Negative for rash.  Neurological: Negative.  Negative for tremors and numbness.  Hematological:  Negative for adenopathy. Does not bruise/bleed easily.  Psychiatric/Behavioral:  Negative for behavioral problems (Depression), sleep disturbance and suicidal ideas. The patient is not nervous/anxious.     Vital Signs: BP 128/64   Pulse 66   Temp 98.3 F (36.8 C)   Resp 16   Ht 5\' 10"  (1.778 m)   Wt 185 lb 6.4 oz (84.1 kg)   SpO2 95%   BMI 26.60 kg/m    Physical Exam Vitals reviewed.  Constitutional:      General: He is not in acute distress.    Appearance: He is well-developed. He is not ill-appearing.  HENT:     Head: Normocephalic and atraumatic.  Eyes:     Pupils: Pupils are equal, round, and reactive to light.  Cardiovascular:     Rate and Rhythm: Normal rate and regular rhythm.  Pulmonary:     Effort: Pulmonary effort is normal. No  respiratory distress.     Breath sounds: Examination of the right-upper field reveals decreased breath sounds. Examination of the left-upper field reveals decreased breath sounds. Decreased breath sounds present. No wheezing.  Chest:     Chest wall: No mass or tenderness.  Skin:    General: Skin is warm and dry.     Capillary Refill: Capillary refill takes less than 2 seconds.  Neurological:     Mental Status: He is alert and oriented to person, place, and time.  Psychiatric:        Mood and Affect: Mood normal.        Behavior: Behavior normal.        Assessment/Plan: 1. OSA on CPAP (Primary) Continue CPAP use as instructed. Follow up in 6 months   2. Chronic obstructive pulmonary disease, unspecified COPD type (HCC) PFT ordered, will follow up with results. Continue trelegy as prescribed. - Pulmonary function test; Future - Fluticasone-Umeclidin-Vilant (TRELEGY ELLIPTA) 100-62.5-25 MCG/ACT AEPB; Inhale 1 puff into the lungs daily.  Dispense: 180 each; Refill: 3  3. CPAP use counseling Continue CPAP use and maintenance as instructed.    General Counseling: Luisangel verbalizes understanding of the findings of todays visit and agrees with plan of treatment. I have discussed any further diagnostic evaluation that may be needed or ordered today. We also reviewed his medications today. he has been encouraged to call the office with any questions or concerns that should arise related to todays visit.    Orders Placed This Encounter  Procedures   Pulmonary function test    Meds ordered this encounter  Medications   Fluticasone-Umeclidin-Vilant (TRELEGY ELLIPTA) 100-62.5-25 MCG/ACT AEPB    Sig: Inhale 1 puff into the lungs daily.    Dispense:  180 each    Refill:  3    Please fill as a 90 day supply    Return for F/U, PFT @  Erasmo Downer PCP and needs a follow up appt to discuss results in april.   Total time spent:30 Minutes Time spent includes review of chart,  medications, test results, and follow up plan with the patient.   [place] Controlled Substance Database was reviewed by me.  This patient was seen by Sallyanne Kuster, FNP-C in collaboration with Dr. Beverely Risen as a part of collaborative care agreement.   Hazelgrace Bonham R. Tedd Sias, MSN, FNP-C Internal medicine

## 2023-04-21 DIAGNOSIS — G4733 Obstructive sleep apnea (adult) (pediatric): Secondary | ICD-10-CM | POA: Diagnosis not present

## 2023-04-21 NOTE — Telephone Encounter (Signed)
Requested Prescriptions  Refused Prescriptions Disp Refills   ezetimibe (ZETIA) 10 MG tablet [Pharmacy Med Name: EZETIMIBE 10MG  TABLETS] 90 tablet 1    Sig: TAKE 1 TABLET BY MOUTH EVERY DAY     Cardiovascular:  Antilipid - Sterol Transport Inhibitors Failed - 04/21/2023  7:41 AM      Failed - AST in normal range and within 360 days    AST  Date Value Ref Range Status  01/22/2023 61 (H) 0 - 40 IU/L Final         Failed - Lipid Panel in normal range within the last 12 months    Cholesterol, Total  Date Value Ref Range Status  01/22/2023 255 (H) 100 - 199 mg/dL Final   LDL Chol Calc (NIH)  Date Value Ref Range Status  01/22/2023 180 (H) 0 - 99 mg/dL Final   HDL  Date Value Ref Range Status  01/22/2023 56 >39 mg/dL Final   Triglycerides  Date Value Ref Range Status  01/22/2023 107 0 - 149 mg/dL Final         Passed - ALT in normal range and within 360 days    ALT  Date Value Ref Range Status  01/22/2023 24 0 - 44 IU/L Final         Passed - Patient is not pregnant      Passed - Valid encounter within last 12 months    Recent Outpatient Visits           1 month ago Mild intermittent asthma with acute exacerbation   [facility]   2 months ago Essential (primary) hypertension   [place] [facility]   8 months ago Mild intermittent asthma with acute exacerbation   [place] [facility]   8 months ago Mild intermittent asthma with acute exacerbation    [facility]   9 months ago Chronic obstructive pulmonary disease with acute exacerbation [facility])    [facility] at [facility]       Future Appointments             In 2 months McGowan, Wellington Hampshire, PA-C [facility] [place]   In 3 months Duanne Limerick, MD [facility] at [facility]

## 2023-05-06 DIAGNOSIS — H903 Sensorineural hearing loss, bilateral: Secondary | ICD-10-CM | POA: Diagnosis not present

## 2023-05-06 DIAGNOSIS — H60543 Acute eczematoid otitis externa, bilateral: Secondary | ICD-10-CM | POA: Diagnosis not present

## 2023-05-06 DIAGNOSIS — H6123 Impacted cerumen, bilateral: Secondary | ICD-10-CM | POA: Diagnosis not present

## 2023-05-13 ENCOUNTER — Encounter: Payer: Medicare Other | Admitting: Internal Medicine

## 2023-05-25 ENCOUNTER — Ambulatory Visit: Payer: Medicare Other | Admitting: Internal Medicine

## 2023-05-27 ENCOUNTER — Encounter: Payer: Self-pay | Admitting: Nurse Practitioner

## 2023-05-27 DIAGNOSIS — G4733 Obstructive sleep apnea (adult) (pediatric): Secondary | ICD-10-CM | POA: Insufficient documentation

## 2023-06-03 ENCOUNTER — Telehealth: Payer: Self-pay | Admitting: Internal Medicine

## 2023-06-03 NOTE — Telephone Encounter (Signed)
 Left vm and sent mychart message to confirm 06/10/23 appointment-Toni

## 2023-06-10 ENCOUNTER — Encounter: Payer: Medicare Other | Admitting: Internal Medicine

## 2023-06-17 ENCOUNTER — Ambulatory Visit: Admitting: Internal Medicine

## 2023-06-17 DIAGNOSIS — R0602 Shortness of breath: Secondary | ICD-10-CM | POA: Diagnosis not present

## 2023-06-17 DIAGNOSIS — J449 Chronic obstructive pulmonary disease, unspecified: Secondary | ICD-10-CM

## 2023-06-18 ENCOUNTER — Ambulatory Visit: Payer: Medicare Other | Admitting: Nurse Practitioner

## 2023-06-25 ENCOUNTER — Ambulatory Visit: Payer: Medicare Other | Admitting: Nurse Practitioner

## 2023-06-29 NOTE — Procedures (Signed)
 [facility] [address] [place], [address]    Complete Pulmonary Function Testing Interpretation:  FINDINGS:   The forced vital capacity is mildly decreased FEV1 is 1.72 L which is 61% of predicted and is moderately decreased.  FEV1 FVC ratio is mildly decreased.  Total lung capacity is mildly decreased.  Residual volume is mildly decreased.  Residual volume lung capacity ratio is increased.  The DLCO was mildly decreased.  IMPRESSION:    This pulmonary function study is consistent with mild obstructive lung disease clinical correlation is recommended.  Cordie Deters, MD [facility] Pulmonary Critical Care Medicine Sleep Medicine

## 2023-07-06 ENCOUNTER — Ambulatory Visit: Payer: Self-pay | Admitting: Urology

## 2023-07-06 ENCOUNTER — Other Ambulatory Visit: Payer: Self-pay

## 2023-07-06 DIAGNOSIS — I10 Essential (primary) hypertension: Secondary | ICD-10-CM

## 2023-07-06 MED ORDER — LOSARTAN POTASSIUM-HCTZ 100-25 MG PO TABS: 1.0000 | ORAL_TABLET | Freq: Every day | ORAL | 0 refills | Status: AC

## 2023-07-07 LAB — PULMONARY FUNCTION TEST

## 2023-07-09 ENCOUNTER — Encounter: Payer: Self-pay | Admitting: Nurse Practitioner

## 2023-07-09 ENCOUNTER — Ambulatory Visit: Admitting: Nurse Practitioner

## 2023-07-09 VITALS — BP 150/68 | HR 64 | Temp 98.8°F | Resp 16 | Ht 70.0 in | Wt 182.2 lb

## 2023-07-09 DIAGNOSIS — G4733 Obstructive sleep apnea (adult) (pediatric): Secondary | ICD-10-CM

## 2023-07-09 DIAGNOSIS — J449 Chronic obstructive pulmonary disease, unspecified: Secondary | ICD-10-CM | POA: Diagnosis not present

## 2023-07-09 DIAGNOSIS — Z7189 Other specified counseling: Secondary | ICD-10-CM

## 2023-07-09 NOTE — Progress Notes (Signed)
 [facility] [address] [place], [place] [address]  Internal MEDICINE  Office Visit Note  Patient Name: Marcus Delacruz  [id number]  [id number]  Date of Service: 07/09/2023  Chief Complaint  Patient presents with   Follow-up    HPI Orby presents for a follow-up visit for COPD, OSA, PFT results.  PFT results reviewed today, FEV1 improved by 5% to 61 % this year. Overall impression is improved from mild restrictive and moderate obstructive lung disease to just mild obstructive lung disease.   COPD -- still taking trelegy which has been working very well for him. He only needs a rescue inhaler during his seasonal allergy months and has not needed to use a nebulizer treatment in more than 2 years. Trelegy still gives him the issue of hoarseness and losing his voice easily.  FYI, he has a new PCP. Dr. Rochelle Chu is retiring and he is now with [facility] clinic in [place].  OSA on CPAP -- doing well, no issues.     Current Medication: Outpatient Encounter Medications as of 07/09/2023  Medication Sig Note   albuterol  (VENTOLIN  HFA) 108 (90 Base) MCG/ACT inhaler INHALE 2 PUFFS BY MOUTH EVERY 6 HOURS AS NEEDED FOR SHORTNESS OF BREATH OR WHEEZING    ALLERGY RELIEF 180 MG tablet TAKE 1 TABLET BY MOUTH EVERY DAY    amLODipine  (NORVASC ) 2.5 MG tablet TAKE 1 TABLET BY MOUTH EVERY DAY    aspirin  81 MG chewable tablet TAKE 1 TABLET BY MOUTH ONCE DAILY    doxycycline  (VIBRA -TABS) 100 MG tablet Take 1 tablet (100 mg total) by mouth 2 (two) times daily.    ezetimibe  (ZETIA ) 10 MG tablet TAKE 1 TABLET BY MOUTH EVERY DAY    finasteride  (PROSCAR ) 5 MG tablet Take 1 tablet (5 mg total) by mouth daily.    Fluticasone -Umeclidin-Vilant (TRELEGY ELLIPTA ) 100-62.5-25 MCG/ACT AEPB Inhale 1 puff into the lungs daily.    ibuprofen  (ADVIL ) 800 MG tablet TAKE 1 TABLET BY MOUTH THREE TIMES DAILY AS NEEDED    ipratropium-albuterol  (DUONEB) 0.5-2.5 (3) MG/3ML SOLN Take 3 mLs by nebulization every 6 (six)  hours as needed.    losartan -hydrochlorothiazide  (HYZAAR) 100-25 MG tablet Take 1 tablet by mouth daily.    montelukast  (SINGULAIR ) 10 MG tablet TAKE 1 TABLET BY MOUTH EVERY DAY 03/19/2023: prn   NON FORMULARY cpap device    rosuvastatin  (CRESTOR ) 5 MG tablet TAKE 1 TABLET BY MOUTH EVERY DAY    No facility-administered encounter medications on file as of 07/09/2023.    Surgical History: Past Surgical History:  Procedure Laterality Date   ARTERY REPAIR     BACK SURGERY     CHOLECYSTECTOMY     COLONOSCOPY  2013   cleared   HERNIA REPAIR      Medical History: Past Medical History:  Diagnosis Date   Allergic rhinitis due to pollen    Asthma    BPH (benign prostatic hyperplasia)    Cardiomegaly    Chronic obstructive pulmonary disease (COPD) (HCC)    Hypercholesteremia    Hypersomnia, unspecified    Hypertension    Obstructive sleep apnea (adult) (pediatric)    Shortness of breath    Snoring    Solitary pulmonary nodule     Family History: Family History  Problem Relation Age of Onset   COPD Mother    Cancer Father        lung   Asthma Son    Prostate cancer Neg Hx    Kidney disease Neg Hx  Social History   Socioeconomic History   Marital status: Married    Spouse name: Pat   Number of children: 3   Years of education: Not on file   Highest education level: Associate degree: occupational, Scientist, product/process development, or vocational program  Occupational History   Occupation: retired  Tobacco Use   Smoking status: Former    Current packs/day: 0.00    Average packs/day: 1 pack/day for 20.0 years (20.0 ttl pk-yrs)    Types: Cigarettes    Start date: 08/09/1963    Quit date: 08/09/1983    Years since quitting: 39.9    Passive exposure: Past   Smokeless tobacco: Never   Tobacco comments:    quit smoking over 40 years ago  Vaping Use   Vaping status: Never Used  Substance and Sexual Activity   Alcohol use: Yes    Comment: 1-2 beers monthly or less   Drug use: No   Sexual  activity: Not Currently  Other Topics Concern   Not on file  Social History Narrative   Not on file   Social Drivers of Health   Financial Resource Strain: Low Risk  (05/11/2023)   Received from [facility]   Overall Financial Resource Strain (CARDIA)    Difficulty of Paying Living Expenses: Not hard at all  Food Insecurity: No Food Insecurity (05/11/2023)   Received from [facility]   Hunger Vital Sign    Worried About Running Out of Food in the Last Year: Never true    Ran Out of Food in the Last Year: Never true  Transportation Needs: No Transportation Needs (05/11/2023)   Received from [facility] - Transportation    In the past 12 months, has lack of transportation kept you from medical appointments or from getting medications?: No    Lack of Transportation (Non-Medical): No  Physical Activity: Inactive (03/19/2023)   Exercise Vital Sign    Days of Exercise per Week: 0 days    Minutes of Exercise per Session: 0 min  Stress: No Stress Concern Present (03/19/2023)   [employer] of Occupational Health - Occupational Stress Questionnaire    Feeling of Stress : Not at all  Social Connections: Moderately Integrated (03/19/2023)   Social Connection and Isolation Panel [NHANES]    Frequency of Communication with Friends and Family: More than three times a week    Frequency of Social Gatherings with Friends and Family: Once a week    Attends Religious Services: 1 to 4 times per year    Active Member of [employer] or Organizations: No    Attends Banker Meetings: Never    Marital Status: Married  Catering manager Violence: Not At Risk (03/19/2023)   Humiliation, Afraid, Rape, and Kick questionnaire    Fear of Current or Ex-Partner: No    Emotionally Abused: No    Physically Abused: No    Sexually Abused: No      Review of Systems  Constitutional:  Positive for fatigue. Negative for chills and unexpected weight  change.  HENT:  Negative for congestion, postnasal drip, rhinorrhea, sneezing and sore throat.   Eyes:  Negative for redness.  Respiratory:  Negative for cough, chest tightness, shortness of breath and wheezing.        Symptoms are intermittent  Cardiovascular: Negative.  Negative for chest pain and palpitations.  Gastrointestinal:  Negative for abdominal pain, constipation, diarrhea, nausea and vomiting.  Genitourinary:  Negative for dysuria and frequency.  Musculoskeletal:  Negative for arthralgias, back pain, joint swelling and neck pain.  Skin:  Negative for rash.  Neurological: Negative.  Negative for tremors and numbness.  Hematological:  Negative for adenopathy. Does not bruise/bleed easily.  Psychiatric/Behavioral:  Negative for behavioral problems (Depression), sleep disturbance and suicidal ideas. The patient is not nervous/anxious.     Vital Signs: BP (!) 150/68   Pulse 64   Temp 98.8 F (37.1 C)   Resp 16   Ht 5\' 10"  (1.778 m)   Wt 182 lb 3.2 oz (82.6 kg)   SpO2 93%   BMI 26.14 kg/m    Physical Exam Vitals reviewed.  Constitutional:      General: He is not in acute distress.    Appearance: He is well-developed. He is not ill-appearing.  HENT:     Head: Normocephalic and atraumatic.  Eyes:     Pupils: Pupils are equal, round, and reactive to light.  Cardiovascular:     Rate and Rhythm: Normal rate and regular rhythm.     Heart sounds: Normal heart sounds. No murmur heard. Pulmonary:     Effort: Pulmonary effort is normal. No respiratory distress.     Breath sounds: Normal breath sounds.  Chest:     Chest wall: No mass.  Skin:    General: Skin is warm and dry.     Capillary Refill: Capillary refill takes less than 2 seconds.  Neurological:     Mental Status: He is alert and oriented to person, place, and time.  Psychiatric:        Mood and Affect: Mood normal.        Behavior: Behavior normal.        Assessment/Plan: 1. Chronic obstructive  pulmonary disease, unspecified COPD type (HCC) (Primary) Continue trelegy as prescribed.   2. OSA on CPAP Continue CPAP use as instructed   3. CPAP use counseling Continue CPAP use as instructed.    General Counseling: Jakiah verbalizes understanding of the findings of todays visit and agrees with plan of treatment. I have discussed any further diagnostic evaluation that may be needed or ordered today. We also reviewed his medications today. he has been encouraged to call the office with any questions or concerns that should arise related to todays visit.    No orders of the defined types were placed in this encounter.   No orders of the defined types were placed in this encounter.   Return in about 1 year (around 07/08/2024) for F/U, pulmonary/sleep, Zacchary Pompei PCP, PFT @ [place] in a year also .   Total time spent:30 Minutes Time spent includes review of chart, medications, test results, and follow up plan with the patient.   [place] Controlled Substance Database was reviewed by me.  This patient was seen by Laurence Pons, FNP-C in collaboration with Dr. Verneta Gone as a part of collaborative care agreement.   Favour Aleshire R. Bobbi Burow, MSN, FNP-C Internal medicine

## 2023-07-13 ENCOUNTER — Encounter: Payer: Self-pay | Admitting: Urology

## 2023-07-13 ENCOUNTER — Ambulatory Visit: Admitting: Urology

## 2023-07-13 VITALS — BP 157/77 | HR 67 | Ht 70.0 in | Wt 182.4 lb

## 2023-07-13 DIAGNOSIS — N401 Enlarged prostate with lower urinary tract symptoms: Secondary | ICD-10-CM

## 2023-07-13 DIAGNOSIS — N138 Other obstructive and reflux uropathy: Secondary | ICD-10-CM

## 2023-07-13 NOTE — Progress Notes (Signed)
 07/13/2023 9:06 AM   Marcus Delacruz 09/11/1940 [id number]  Referring provider: Clarise Crooks, MD [address] [place],  [place] [address]  Urological history: 1.  BPH with LU TS - PSA (2019) 0.8 -aged out of screening - Finasteride  5 mg daily  Chief Complaint  Patient presents with   Benign Prostatic Hypertrophy   HPI: Marcus Delacruz is a 83 y.o. man who presents today for yearly visit for BPH with LUTS.  Previous records reviewed.   I PSS 0/1  He has no urinary complaints.  He is taking the finasteride  5 mg daily.  Patient denies any modifying or aggravating factors.  Patient denies any recent UTI's, gross hematuria, dysuria or suprapubic/flank pain.  Patient denies any fevers, chills, nausea or vomiting.     IPSS     Row Name 07/13/23 0800         International Prostate Symptom Score   How often have you had the sensation of not emptying your bladder? Not at All     How often have you had to urinate less than every two hours? Not at All     How often have you found you stopped and started again several times when you urinated? Not at All     How often have you found it difficult to postpone urination? Not at All     How often have you had a weak urinary stream? Not at All     How often have you had to strain to start urination? Not at All     How many times did you typically get up at night to urinate? None     Total IPSS Score 0       Quality of Life due to urinary symptoms   If you were to spend the rest of your life with your urinary condition just the way it is now how would you feel about that? Pleased             Score:  1-7 Mild 8-19 Moderate 20-35 Severe   PMH: Past Medical History:  Diagnosis Date   Allergic rhinitis due to pollen    Asthma    BPH (benign prostatic hyperplasia)    Cardiomegaly    Chronic obstructive pulmonary disease (COPD) (HCC)    Hypercholesteremia    Hypersomnia, unspecified    Hypertension     Obstructive sleep apnea (adult) (pediatric)    Shortness of breath    Snoring    Solitary pulmonary nodule     Surgical History: Past Surgical History:  Procedure Laterality Date   ARTERY REPAIR     BACK SURGERY     CHOLECYSTECTOMY     COLONOSCOPY  2013   cleared   HERNIA REPAIR      Home Medications:  Allergies as of 07/13/2023       Reactions   Codeine  Itching        Medication List        Accurate as of Jul 13, 2023  9:06 AM. If you have any questions, ask your nurse or doctor.          STOP taking these medications    doxycycline  100 MG tablet Commonly known as: VIBRA -TABS       TAKE these medications    albuterol  108 (90 Base) MCG/ACT inhaler Commonly known as: Ventolin  HFA INHALE 2 PUFFS BY MOUTH EVERY 6 HOURS AS NEEDED FOR SHORTNESS OF BREATH OR WHEEZING   Allergy  Relief 180 MG tablet Generic drug: fexofenadine  TAKE 1 TABLET BY MOUTH EVERY DAY   amLODipine  2.5 MG tablet Commonly known as: NORVASC  TAKE 1 TABLET BY MOUTH EVERY DAY   aspirin  81 MG chewable tablet TAKE 1 TABLET BY MOUTH ONCE DAILY   ezetimibe  10 MG tablet Commonly known as: ZETIA  TAKE 1 TABLET BY MOUTH EVERY DAY   finasteride  5 MG tablet Commonly known as: PROSCAR  Take 1 tablet (5 mg total) by mouth daily.   Fluticasone -Umeclidin-Vilant 100-62.5-25 MCG/ACT Aepb Commonly known as: Trelegy Ellipta  Inhale 1 puff into the lungs daily.   ibuprofen  800 MG tablet Commonly known as: ADVIL  TAKE 1 TABLET BY MOUTH THREE TIMES DAILY AS NEEDED   ipratropium-albuterol  0.5-2.5 (3) MG/3ML Soln Commonly known as: DUONEB Take 3 mLs by nebulization every 6 (six) hours as needed.   losartan -hydrochlorothiazide  100-25 MG tablet Commonly known as: HYZAAR Take 1 tablet by mouth daily.   montelukast  10 MG tablet Commonly known as: SINGULAIR  TAKE 1 TABLET BY MOUTH EVERY DAY   NON FORMULARY cpap device   rosuvastatin  5 MG tablet Commonly known as: CRESTOR  TAKE 1 TABLET BY MOUTH  EVERY DAY        Allergies:  Allergies  Allergen Reactions   Codeine  Itching    Family History: Family History  Problem Relation Age of Onset   COPD Mother    Cancer Father        lung   Asthma Son    Prostate cancer Neg Hx    Kidney disease Neg Hx     Social History:  reports that he quit smoking about 39 years ago. His smoking use included cigarettes. He started smoking about 59 years ago. He has a 20 pack-year smoking history. He has been exposed to tobacco smoke. He has never used smokeless tobacco. He reports current alcohol use. He reports that he does not use drugs.  ROS: Pertinent ROS in HPI  Physical Exam: BP (!) 157/77   Pulse 67   Ht 5\' 10"  (1.778 m)   Wt 182 lb 6 oz (82.7 kg)   BMI 26.17 kg/m   Constitutional:  Well nourished. Alert and oriented, No acute distress. HEENT: [place] AT, moist mucus membranes.  Trachea midline Cardiovascular: No clubbing, cyanosis, or edema. Respiratory: Normal respiratory effort, no increased work of breathing. Neurologic: Grossly intact, no focal deficits, moving all 4 extremities. Psychiatric: Normal mood and affect.  Laboratory Data: CBC w/auto Differential (3 Part) Order: [id number] Component Ref Range & Units 2 mo ago  WBC (White Blood Cell Count) 4.1 - 10.2 10^3/uL 7.7  RBC (Red Blood Cell Count) 4.69 - 6.13 10^6/uL 5.12  Hemoglobin 14.1 - 18.1 gm/dL 82.9  Hematocrit 56.2 - 52.0 % 46.1  MCV (Mean Corpuscular Volume) 80.0 - 100.0 fl 90  MCH (Mean Corpuscular Hemoglobin) 27.0 - 31.2 pg 29.7  MCHC (Mean Corpuscular Hemoglobin Concentration) 32.0 - 36.0 gm/dL 33  Platelet Count 130 - 450 10^3/uL 271  RDW-CV (Red Cell Distribution Width) 11.6 - 14.8 % 12.5  MPV (Mean Platelet Volume) 9.4 - 12.4 fl 9.6  Neutrophils 1.50 - 7.80 10^3/uL 5.4  Lymphocytes 1.00 - 3.60 10^3/uL 1.4  Mixed Count 0.10 - 0.90 10^3/uL 0.9  Neutrophil % 32.0 - 70.0 % 69.9  Lymphocyte % 10.0 - 50.0 % 18.1  Mixed % 3.0 - 14.4 % 12   Resulting Agency [facility]   Specimen Collected: 05/11/23 08:54   Performed by: [facility] Last Resulted: 05/11/23 11:38  Received From: [facility]  Result Received: 05/27/23 07:21   Lipid Panel w/calc LDL Order: [id number] Component Ref Range & Units 2 mo ago  Cholesterol, Total 100 - 200 mg/dL 409  Triglyceride 35 - 199 mg/dL 88  HDL (High Density Lipoprotein) Cholesterol 29.0 - 71.0 mg/dL 81.1  LDL Calculated 0 - 130 mg/dL 77  VLDL Cholesterol mg/dL 18  Cholesterol/HDL Ratio 2.7  Resulting Agency [facility]   Specimen Collected: 05/11/23 08:54   Performed by: [facility] Last Resulted: 05/11/23 17:05  Received From: [facility]  Result Received: 05/27/23 07:21   Hemoglobin A1C Order: [id number] Component Ref Range & Units 2 mo ago  Hemoglobin A1C 4.2 - 5.6 % 5.6  Average Blood Glucose (Calc) mg/dL 213  Resulting Agency [facility]  Narrative Performed by [facility] Normal Range:    4.2 - 5.6% Increased Risk:  5.7 - 6.4% Diabetes:        >= 6.5% Glycemic Control for adults with diabetes:  <7%    Specimen Collected: 05/11/23 08:54   Performed by: [facility] Last Resulted: 05/11/23 13:56  Received From: [facility]  Result Received: 05/27/23 07:21   Basic Metabolic Panel (BMP) Order: [id number] Component Ref Range & Units 2 mo ago  Glucose 70 - 110 mg/dL 89  Sodium 629 - 528 mmol/L 142  Potassium 3.6 - 5.1 mmol/L 4.1  Chloride 97 - 109 mmol/L 106  Carbon Dioxide (CO2) 22.0 - 32.0 mmol/L 27  Calcium  8.7 - 10.3 mg/dL 9.2  Urea  Nitrogen (BUN) 7 - 25 mg/dL 18  Creatinine 0.7 - 1.3 mg/dL 0.9  Glomerular Filtration Rate (eGFR) >60 mL/min/1.73sq m 86  Comment: CKD-EPI (2021) does not include patient's race in the calculation of eGFR.  Monitoring changes of plasma creatinine and eGFR over  time is useful for monitoring kidney function.  Interpretive Ranges for eGFR (CKD-EPI 2021):  eGFR:       >60 mL/min/1.73 sq. m - Normal eGFR:       30-59 mL/min/1.73 sq. m - Moderately Decreased eGFR:       15-29 mL/min/1.73 sq. m  - Severely Decreased eGFR:       < 15 mL/min/1.73 sq. m  - Kidney Failure   Note: These eGFR calculations do not apply in acute situations when eGFR is changing rapidly or patients on dialysis.  BUN/Crea Ratio 6.0 - 20.0 20  Anion Gap w/K 6.0 - 16.0 13.1  Resulting Agency [facility]   Specimen Collected: 05/11/23 08:54   Performed by: [facility] Last Resulted: 05/11/23 17:05  Received From: [facility]  Result Received: 05/27/23 07:21   Lab Results  Component Value Date   AST 61 (H) 01/22/2023   Lab Results  Component Value Date   ALT 24 01/22/2023  I have reviewed the labs.   Pertinent Imaging: N/A  Assessment & Plan:    1. BPH with LUTS -continue conservative management, avoiding bladder irritants and timed voiding's -Continue finasteride  5 mg daily  Return in about 1 year (around 07/12/2024) for I PSS .  These notes generated with voice recognition software. I apologize for typographical errors.  Briant Camper  [facility] [address]  [address] [place], [place] [address] [phone]

## 2023-07-23 ENCOUNTER — Ambulatory Visit: Payer: Self-pay | Admitting: Family Medicine

## 2023-07-28 ENCOUNTER — Encounter (INDEPENDENT_AMBULATORY_CARE_PROVIDER_SITE_OTHER): Payer: Self-pay

## 2023-09-25 ENCOUNTER — Ambulatory Visit (INDEPENDENT_AMBULATORY_CARE_PROVIDER_SITE_OTHER): Payer: Medicare Other

## 2023-09-25 ENCOUNTER — Ambulatory Visit (INDEPENDENT_AMBULATORY_CARE_PROVIDER_SITE_OTHER): Payer: Medicare Other | Admitting: Vascular Surgery

## 2023-09-25 ENCOUNTER — Encounter (INDEPENDENT_AMBULATORY_CARE_PROVIDER_SITE_OTHER): Payer: Self-pay | Admitting: Vascular Surgery

## 2023-09-25 VITALS — BP 122/70 | HR 72 | Ht 70.0 in | Wt 176.0 lb

## 2023-09-25 DIAGNOSIS — I10 Essential (primary) hypertension: Secondary | ICD-10-CM

## 2023-09-25 DIAGNOSIS — E7849 Other hyperlipidemia: Secondary | ICD-10-CM

## 2023-09-25 DIAGNOSIS — I6523 Occlusion and stenosis of bilateral carotid arteries: Secondary | ICD-10-CM | POA: Diagnosis not present

## 2023-09-25 NOTE — Progress Notes (Signed)
 MRN : [id number]  Marcus Delacruz is a 83 y.o. (1940-07-16) male who presents with chief complaint of  Chief Complaint  Patient presents with   F/u 6 months  Carotid  JD  .  History of Present Illness: Patient returns today in follow up of his carotid disease.  He is doing well.  He denies any focal neurologic symptoms since his last visit. Specifically, the patient denies amaurosis fugax, speech or swallowing difficulties, or arm or leg weakness or numbness.  He has about 15 years status post right carotid endarterectomy for symptomatic stenosis with TIA symptoms.  He has done well since then.  His duplex today shows a widely patent right carotid endarterectomy without recurrent stenosis.  His left carotid artery continues to have velocities in the 60 to 79% range.  Current Outpatient Medications  Medication Sig Dispense Refill   albuterol  (VENTOLIN  HFA) 108 (90 Base) MCG/ACT inhaler INHALE 2 PUFFS BY MOUTH EVERY 6 HOURS AS NEEDED FOR SHORTNESS OF BREATH OR WHEEZING 18 g 1   ALLERGY RELIEF 180 MG tablet TAKE 1 TABLET BY MOUTH EVERY DAY 90 tablet 1   amLODipine  (NORVASC ) 2.5 MG tablet TAKE 1 TABLET BY MOUTH EVERY DAY 90 tablet 1   aspirin  81 MG chewable tablet TAKE 1 TABLET BY MOUTH ONCE DAILY 90 tablet 3   ezetimibe  (ZETIA ) 10 MG tablet TAKE 1 TABLET BY MOUTH EVERY DAY 90 tablet 1   finasteride  (PROSCAR ) 5 MG tablet Take 1 tablet (5 mg total) by mouth daily. 90 tablet 3   Fluticasone -Umeclidin-Vilant (TRELEGY ELLIPTA ) 100-62.5-25 MCG/ACT AEPB Inhale 1 puff into the lungs daily. 180 each 3   ibuprofen  (ADVIL ) 800 MG tablet TAKE 1 TABLET BY MOUTH THREE TIMES DAILY AS NEEDED 180 tablet 0   ipratropium-albuterol  (DUONEB) 0.5-2.5 (3) MG/3ML SOLN Take 3 mLs by nebulization every 6 (six) hours as needed. 360 mL 1   losartan -hydrochlorothiazide  (HYZAAR) 100-25 MG tablet Take 1 tablet by mouth daily. 30 tablet 0   montelukast  (SINGULAIR ) 10 MG tablet TAKE 1 TABLET BY MOUTH EVERY DAY 90  tablet 1   NON FORMULARY cpap device     rosuvastatin  (CRESTOR ) 5 MG tablet TAKE 1 TABLET BY MOUTH EVERY DAY 90 tablet 1   No current facility-administered medications for this visit.    Past Medical History:  Diagnosis Date   Allergic rhinitis due to pollen    Asthma    BPH (benign prostatic hyperplasia)    Cardiomegaly    Chronic obstructive pulmonary disease (COPD) (HCC)    Hypercholesteremia    Hypersomnia, unspecified    Hypertension    Obstructive sleep apnea (adult) (pediatric)    Shortness of breath    Snoring    Solitary pulmonary nodule     Past Surgical History:  Procedure Laterality Date   ARTERY REPAIR     BACK SURGERY     CHOLECYSTECTOMY     COLONOSCOPY  2013   cleared   HERNIA REPAIR       Social History   Tobacco Use   Smoking status: Former    Current packs/day: 0.00    Average packs/day: 1 pack/day for 20.0 years (20.0 ttl pk-yrs)    Types: Cigarettes    Start date: 08/09/1963    Quit date: 08/09/1983    Years since quitting: 40.1    Passive exposure: Past   Smokeless tobacco: Never   Tobacco comments:    quit smoking over 40 years ago  Vaping Use   Vaping  status: Never Used  Substance Use Topics   Alcohol use: Yes    Comment: 1-2 beers monthly or less   Drug use: No      Family History  Problem Relation Age of Onset   COPD Mother    Cancer Father        lung   Asthma Son    Prostate cancer Neg Hx    Kidney disease Neg Hx      Allergies  Allergen Reactions   Codeine  Itching     REVIEW OF SYSTEMS (Negative unless checked)   Constitutional: [] Weight loss  [] Fever  [] Chills Cardiac: [] Chest pain   [] Chest pressure   [] Palpitations   [] Shortness of breath when laying flat   [] Shortness of breath at rest   [] Shortness of breath with exertion. Vascular:  [] Pain in legs with walking   [] Pain in legs at rest   [] Pain in legs when laying flat   [] Claudication   [] Pain in feet when walking  [] Pain in feet at rest  [] Pain in feet when  laying flat   [] History of DVT   [] Phlebitis   [] Swelling in legs   [] Varicose veins   [] Non-healing ulcers Pulmonary:   [] Uses home oxygen    [] Productive cough   [] Hemoptysis   [] Wheeze  [] COPD   [] Asthma Neurologic:  [] Dizziness  [] Blackouts   [] Seizures   [] History of stroke   [x] History of TIA  [] Aphasia   [] Temporary blindness   [] Dysphagia   [] Weakness or numbness in arms   [] Weakness or numbness in legs Musculoskeletal:  [x] Arthritis   [] Joint swelling   [] Joint pain   [] Low back pain Hematologic:  [] Easy bruising  [] Easy bleeding   [] Hypercoagulable state   [] Anemic  [] Hepatitis Gastrointestinal:  [] Blood in stool   [] Vomiting blood  [] Gastroesophageal reflux/heartburn   [] Difficulty swallowing. Genitourinary:  [] Chronic kidney disease   [] Difficult urination  [] Frequent urination  [] Burning with urination   [] Blood in urine Skin:  [] Rashes   [] Ulcers   [] Wounds Psychological:  [] History of anxiety   []  History of major depression.  Physical Examination  BP 122/70   Pulse 72   Ht 5' 10 (1.778 m)   Wt 176 lb (79.8 kg)   BMI 25.25 kg/m  Gen:  WD/WN, NAD. Appears younger than stated age. Head: [place]/AT, No temporalis wasting. Ear/Nose/Throat: Hearing grossly intact, nares w/o erythema or drainage Eyes: Conjunctiva clear. Sclera non-icteric Neck: Supple.  Trachea midline Pulmonary:  Good air movement, no use of accessory muscles.  Cardiac: RRR, no JVD Vascular:  Vessel Right Left  Radial Palpable Palpable               Musculoskeletal: M/S 5/5 throughout.  No deformity or atrophy. No edema. Neurologic: Sensation grossly intact in extremities.  Symmetrical.  Speech is fluent.  Psychiatric: Judgment intact, Mood & affect appropriate for pt's clinical situation. Dermatologic: No rashes or ulcers noted.  No cellulitis or open wounds.      Labs Recent Results (from the past 2160 hours)  Pulmonary Function Test     Status: None   Collection Time: 07/07/23 10:10 AM  Result  Value Ref Range   FEV1     FVC     FEV1/FVC     TLC     DLCO      Radiology No results found.  Assessment/Plan  Familial multiple lipoprotein-type hyperlipidemia lipid control important in reducing the progression of atherosclerotic disease. Continue statin therapy   Bilateral carotid artery stenosis His  duplex today shows a widely patent right carotid endarterectomy without recurrent stenosis.  His left carotid artery continues to have velocities in the 60 to 79% range.  Continue aspirin  and Crestor .  Continue follow-up in 6 months with carotid duplex  Essential (primary) hypertension blood pressure control important in reducing the progression of atherosclerotic disease. On appropriate oral medications.   Selinda Gu, MD  09/26/2023 6:14 PM    This note was created with Dragon medical transcription system.  Any errors from dictation are purely unintentional

## 2023-09-25 NOTE — Assessment & Plan Note (Signed)
 lipid control important in reducing the progression of atherosclerotic disease. Continue statin therapy

## 2023-09-26 NOTE — Assessment & Plan Note (Signed)
 His duplex today shows a widely patent right carotid endarterectomy without recurrent stenosis.  His left carotid artery continues to have velocities in the 60 to 79% range.  Continue aspirin  and Crestor .  Continue follow-up in 6 months with carotid duplex

## 2024-02-01 ENCOUNTER — Telehealth: Payer: Self-pay | Admitting: Urology

## 2024-02-01 NOTE — Telephone Encounter (Signed)
 Patient called to request refill for Finasteride . Pharmacy told him to contact our office. It looks like the last time he got a prescription was from Dr. Cathryne Molt, and patient said she has retired. Pharmacy is [facility] in [place].

## 2024-02-02 ENCOUNTER — Other Ambulatory Visit: Payer: Self-pay

## 2024-02-02 DIAGNOSIS — N138 Other obstructive and reflux uropathy: Secondary | ICD-10-CM

## 2024-02-02 MED ORDER — FINASTERIDE 5 MG PO TABS: 5.0000 mg | ORAL_TABLET | Freq: Every day | ORAL | 3 refills | Status: AC

## 2024-03-29 ENCOUNTER — Ambulatory Visit (INDEPENDENT_AMBULATORY_CARE_PROVIDER_SITE_OTHER): Admitting: Vascular Surgery

## 2024-03-29 ENCOUNTER — Other Ambulatory Visit (INDEPENDENT_AMBULATORY_CARE_PROVIDER_SITE_OTHER)

## 2024-03-29 DIAGNOSIS — I6523 Occlusion and stenosis of bilateral carotid arteries: Secondary | ICD-10-CM | POA: Diagnosis not present

## 2024-04-01 ENCOUNTER — Ambulatory Visit (INDEPENDENT_AMBULATORY_CARE_PROVIDER_SITE_OTHER): Admitting: Vascular Surgery

## 2024-04-15 ENCOUNTER — Ambulatory Visit (INDEPENDENT_AMBULATORY_CARE_PROVIDER_SITE_OTHER): Admitting: Nurse Practitioner

## 2024-04-15 ENCOUNTER — Encounter (INDEPENDENT_AMBULATORY_CARE_PROVIDER_SITE_OTHER): Payer: Self-pay | Admitting: Nurse Practitioner

## 2024-04-15 VITALS — BP 136/71 | HR 67 | Resp 18 | Wt 180.6 lb

## 2024-04-15 DIAGNOSIS — I6523 Occlusion and stenosis of bilateral carotid arteries: Secondary | ICD-10-CM

## 2024-06-22 ENCOUNTER — Encounter: Admitting: Internal Medicine

## 2024-07-11 ENCOUNTER — Ambulatory Visit: Admitting: Urology

## 2024-07-12 ENCOUNTER — Ambulatory Visit: Admitting: Nurse Practitioner

## 2024-10-12 ENCOUNTER — Ambulatory Visit (INDEPENDENT_AMBULATORY_CARE_PROVIDER_SITE_OTHER): Admitting: Nurse Practitioner

## 2024-10-12 ENCOUNTER — Encounter (INDEPENDENT_AMBULATORY_CARE_PROVIDER_SITE_OTHER)
